# Patient Record
Sex: Male | Born: 1955 | Race: Black or African American | Hispanic: No | Marital: Single | Smoking: Never smoker
Health system: Southern US, Community
[De-identification: ages and names within clinical notes are randomized; demographics above are authoritative.]

## PROBLEM LIST (undated history)

## (undated) DIAGNOSIS — R569 Unspecified convulsions: Secondary | ICD-10-CM

## (undated) DIAGNOSIS — I639 Cerebral infarction, unspecified: Secondary | ICD-10-CM

## (undated) DIAGNOSIS — R040 Epistaxis: Secondary | ICD-10-CM

## (undated) DIAGNOSIS — I671 Cerebral aneurysm, nonruptured: Secondary | ICD-10-CM

## (undated) DIAGNOSIS — K30 Functional dyspepsia: Secondary | ICD-10-CM

## (undated) DIAGNOSIS — I2510 Atherosclerotic heart disease of native coronary artery without angina pectoris: Secondary | ICD-10-CM

## (undated) DIAGNOSIS — H269 Unspecified cataract: Secondary | ICD-10-CM

## (undated) DIAGNOSIS — I10 Essential (primary) hypertension: Secondary | ICD-10-CM

## (undated) DIAGNOSIS — K4090 Unilateral inguinal hernia, without obstruction or gangrene, not specified as recurrent: Secondary | ICD-10-CM

## (undated) DIAGNOSIS — K219 Gastro-esophageal reflux disease without esophagitis: Secondary | ICD-10-CM

## (undated) DIAGNOSIS — IMO0002 Reserved for concepts with insufficient information to code with codable children: Secondary | ICD-10-CM

## (undated) HISTORY — DX: Unilateral inguinal hernia, without obstruction or gangrene, not specified as recurrent: K40.90

## (undated) HISTORY — PX: FRACTURE SURGERY: SHX138

## (undated) HISTORY — PX: FOOT SURGERY: SHX648

## (undated) HISTORY — DX: Atherosclerotic heart disease of native coronary artery without angina pectoris: I25.10

## (undated) HISTORY — PX: SKIN GRAFT: SHX250

---

## 1999-07-22 ENCOUNTER — Emergency Department (HOSPITAL_COMMUNITY): Admission: EM | Admit: 1999-07-22 | Discharge: 1999-07-22 | Payer: Self-pay | Admitting: Emergency Medicine

## 1999-08-10 ENCOUNTER — Emergency Department (HOSPITAL_COMMUNITY): Admission: EM | Admit: 1999-08-10 | Discharge: 1999-08-10 | Payer: Self-pay | Admitting: Emergency Medicine

## 1999-11-13 ENCOUNTER — Emergency Department (HOSPITAL_COMMUNITY): Admission: EM | Admit: 1999-11-13 | Discharge: 1999-11-13 | Payer: Self-pay | Admitting: Emergency Medicine

## 2000-08-28 ENCOUNTER — Emergency Department (HOSPITAL_COMMUNITY): Admission: EM | Admit: 2000-08-28 | Discharge: 2000-08-28 | Payer: Self-pay | Admitting: Emergency Medicine

## 2000-08-28 ENCOUNTER — Encounter: Payer: Self-pay | Admitting: Emergency Medicine

## 2008-07-01 ENCOUNTER — Emergency Department (HOSPITAL_COMMUNITY): Admission: EM | Admit: 2008-07-01 | Discharge: 2008-07-01 | Payer: Self-pay | Admitting: Family Medicine

## 2008-07-03 ENCOUNTER — Emergency Department (HOSPITAL_COMMUNITY): Admission: EM | Admit: 2008-07-03 | Discharge: 2008-07-03 | Payer: Self-pay | Admitting: Family Medicine

## 2008-07-05 ENCOUNTER — Emergency Department (HOSPITAL_COMMUNITY): Admission: EM | Admit: 2008-07-05 | Discharge: 2008-07-05 | Payer: Self-pay | Admitting: Emergency Medicine

## 2010-05-29 ENCOUNTER — Inpatient Hospital Stay (INDEPENDENT_AMBULATORY_CARE_PROVIDER_SITE_OTHER)
Admission: RE | Admit: 2010-05-29 | Discharge: 2010-05-29 | Disposition: A | Discharge status: Home / Self Care | Payer: Self-pay | Source: Ambulatory Visit

## 2010-05-29 DIAGNOSIS — S5000XA Contusion of unspecified elbow, initial encounter: Secondary | ICD-10-CM

## 2010-05-29 DIAGNOSIS — I10 Essential (primary) hypertension: Secondary | ICD-10-CM

## 2010-05-29 LAB — POCT I-STAT, CHEM 8
BUN: 10 mg/dL (ref 6–23)
Calcium, Ion: 1.15 mmol/L (ref 1.12–1.32)
Chloride: 101 mEq/L (ref 96–112)
Creatinine, Ser: 1.2 mg/dL (ref 0.4–1.5)
Glucose, Bld: 120 mg/dL — ABNORMAL HIGH (ref 70–99)
HCT: 50 % (ref 39.0–52.0)
Hemoglobin: 17 g/dL (ref 13.0–17.0)
Potassium: 4.2 mEq/L (ref 3.5–5.1)
Sodium: 137 mEq/L (ref 135–145)
TCO2: 25 mmol/L (ref 0–100)

## 2010-06-06 ENCOUNTER — Inpatient Hospital Stay (INDEPENDENT_AMBULATORY_CARE_PROVIDER_SITE_OTHER)
Admission: RE | Admit: 2010-06-06 | Discharge: 2010-06-06 | Disposition: A | Discharge status: Home / Self Care | Payer: Self-pay | Source: Ambulatory Visit

## 2010-06-06 DIAGNOSIS — S5010XA Contusion of unspecified forearm, initial encounter: Secondary | ICD-10-CM

## 2010-06-06 DIAGNOSIS — I10 Essential (primary) hypertension: Secondary | ICD-10-CM

## 2010-07-11 LAB — POCT I-STAT, CHEM 8
BUN: 32 mg/dL — ABNORMAL HIGH (ref 6–23)
Calcium, Ion: 1.18 mmol/L (ref 1.12–1.32)
Chloride: 108 mEq/L (ref 96–112)
Creatinine, Ser: 1.1 mg/dL (ref 0.4–1.5)
Glucose, Bld: 152 mg/dL — ABNORMAL HIGH (ref 70–99)
HCT: 46 % (ref 39.0–52.0)
Hemoglobin: 15.6 g/dL (ref 13.0–17.0)
Potassium: 4.2 mEq/L (ref 3.5–5.1)
Sodium: 139 mEq/L (ref 135–145)
TCO2: 25 mmol/L (ref 0–100)

## 2010-07-11 LAB — CBC
HCT: 38.2 % — ABNORMAL LOW (ref 39.0–52.0)
Hemoglobin: 13.2 g/dL (ref 13.0–17.0)
MCHC: 34.6 g/dL (ref 30.0–36.0)
MCV: 86.9 fL (ref 78.0–100.0)
Platelets: 216 10*3/uL (ref 150–400)
RBC: 4.4 MIL/uL (ref 4.22–5.81)
RDW: 13.4 % (ref 11.5–15.5)
WBC: 6.5 10*3/uL (ref 4.0–10.5)

## 2011-10-07 ENCOUNTER — Encounter (HOSPITAL_COMMUNITY): Payer: Self-pay | Admitting: Emergency Medicine

## 2011-10-07 ENCOUNTER — Emergency Department (HOSPITAL_COMMUNITY)
Admission: EM | Admit: 2011-10-07 | Discharge: 2011-10-07 | Disposition: A | Discharge status: Home / Self Care | Payer: Self-pay | Attending: Emergency Medicine | Admitting: Emergency Medicine

## 2011-10-07 ENCOUNTER — Emergency Department (INDEPENDENT_AMBULATORY_CARE_PROVIDER_SITE_OTHER)
Admission: EM | Admit: 2011-10-07 | Discharge: 2011-10-07 | Disposition: A | Discharge status: Nursing Home (Includes ICF) | Payer: Self-pay | Source: Home / Self Care | Attending: Family Medicine | Admitting: Family Medicine

## 2011-10-07 DIAGNOSIS — I160 Hypertensive urgency: Secondary | ICD-10-CM

## 2011-10-07 DIAGNOSIS — I10 Essential (primary) hypertension: Secondary | ICD-10-CM | POA: Insufficient documentation

## 2011-10-07 DIAGNOSIS — R040 Epistaxis: Secondary | ICD-10-CM

## 2011-10-07 HISTORY — DX: Essential (primary) hypertension: I10

## 2011-10-07 LAB — CBC WITH DIFFERENTIAL/PLATELET
Basophils Absolute: 0 10*3/uL (ref 0.0–0.1)
Basophils Relative: 1 % (ref 0–1)
Eosinophils Absolute: 0.1 10*3/uL (ref 0.0–0.7)
HCT: 46.8 % (ref 39.0–52.0)
Hemoglobin: 16.3 g/dL (ref 13.0–17.0)
MCH: 29.2 pg (ref 26.0–34.0)
MCHC: 34.8 g/dL (ref 30.0–36.0)
Monocytes Absolute: 0.4 10*3/uL (ref 0.1–1.0)
Monocytes Relative: 4 % (ref 3–12)
Neutro Abs: 7.7 10*3/uL (ref 1.7–7.7)
Platelets: 229 10*3/uL (ref 150–400)
RDW: 12.6 % (ref 11.5–15.5)

## 2011-10-07 LAB — BASIC METABOLIC PANEL
BUN: 27 mg/dL — ABNORMAL HIGH (ref 6–23)
Calcium: 9.5 mg/dL (ref 8.4–10.5)
Chloride: 101 mEq/L (ref 96–112)
Creatinine, Ser: 1.08 mg/dL (ref 0.50–1.35)
GFR calc Af Amer: 86 mL/min — ABNORMAL LOW (ref >90)
GFR calc non Af Amer: 74 mL/min — ABNORMAL LOW (ref >90)
Sodium: 136 mEq/L (ref 135–145)

## 2011-10-07 LAB — URINALYSIS, ROUTINE W REFLEX MICROSCOPIC
Bilirubin Urine: NEGATIVE
Glucose, UA: NEGATIVE mg/dL
Ketones, ur: NEGATIVE mg/dL
Leukocytes, UA: NEGATIVE
Nitrite: NEGATIVE
Protein, ur: NEGATIVE mg/dL

## 2011-10-07 LAB — POCT I-STAT, CHEM 8
Calcium, Ion: 1.2 mmol/L (ref 1.12–1.23)
Glucose, Bld: 88 mg/dL (ref 70–99)
HCT: 49 % (ref 39.0–52.0)
TCO2: 24 mmol/L (ref 0–100)

## 2011-10-07 MED ORDER — FUROSEMIDE 40 MG PO TABS
40.0000 mg | ORAL_TABLET | Freq: Once | ORAL | Status: AC
  Administered 2011-10-07: 40 mg via ORAL

## 2011-10-07 MED ORDER — HYDROCHLOROTHIAZIDE 25 MG PO TABS: 25.0000 mg | ORAL_TABLET | Freq: Every day | ORAL | Status: DC

## 2011-10-07 MED ORDER — CLONIDINE HCL 0.1 MG PO TABS
0.1000 mg | ORAL_TABLET | Freq: Once | ORAL | Status: AC
  Administered 2011-10-07: 0.1 mg via ORAL
  Filled 2011-10-07: qty 1

## 2011-10-07 MED ORDER — LISINOPRIL 10 MG PO TABS
10.0000 mg | ORAL_TABLET | Freq: Once | ORAL | Status: AC
  Administered 2011-10-07: 10 mg via ORAL
  Filled 2011-10-07: qty 1

## 2011-10-07 MED ORDER — OXYMETAZOLINE HCL 0.05 % NA SOLN
1.0000 | Freq: Once | NASAL | Status: AC
  Administered 2011-10-07: 1 via NASAL

## 2011-10-07 MED ORDER — CLONIDINE HCL 0.1 MG PO TABS
0.1000 mg | ORAL_TABLET | Freq: Once | ORAL | Status: AC
  Administered 2011-10-07: 0.1 mg via ORAL

## 2011-10-07 MED ORDER — FUROSEMIDE 40 MG PO TABS
ORAL_TABLET | ORAL | Status: AC
  Filled 2011-10-07: qty 1

## 2011-10-07 MED ORDER — CLONIDINE HCL 0.1 MG PO TABS
ORAL_TABLET | ORAL | Status: AC
  Filled 2011-10-07: qty 1

## 2011-10-07 MED ORDER — FUROSEMIDE 10 MG/ML IJ SOLN: 40.0000 mg | Freq: Once | INTRAMUSCULAR | Status: DC

## 2011-10-07 NOTE — ED Notes (Addendum)
Pt sent here from [facility] for htn and nose bleed today; pt sts off htn meds x 1 year; pt given clonidine at [facility]

## 2011-10-07 NOTE — ED Notes (Signed)
The patient is AOx4 and comfortable with his discharge instructions. 

## 2011-10-07 NOTE — ED Notes (Signed)
C/o nose bleed and high blood pressure.  Currently no bleeding noted and patient denies any bleeding currently.  Also reports nose bleeds last night.  Ran out of blood pressure medicine approx 6 months ago

## 2011-10-07 NOTE — ED Notes (Signed)
I spoke with the MD and advised that the patient had told me he had an intolerance to clonidine in that it makes him nauseous.  The MD advised that he had a dose earlier today at our urgent care center (prior to the medicine being added to his list of allergies).  I asked the patient is he became nauseous earlier today, and he said that he did not.  I suggested that we give him the medicine for its work on his diastolic BP and give him some antiemetics later on prn.  The patient agreed.

## 2011-10-07 NOTE — ED Notes (Signed)
Pt does not have to void at this time he said he went in the waiting room. Asked pt to notify us when he has to go

## 2011-10-08 NOTE — ED Provider Notes (Signed)
History     CSN: [id number]  Arrival date & time 10/07/11  1733   First MD Initiated Contact with Patient 10/07/11 1958      Chief Complaint  Patient presents with  . Epistaxis  . Hypertension    (Consider location/radiation/quality/duration/timing/severity/associated sxs/prior treatment) Patient is a 57 y.o. male presenting with nosebleeds and hypertension. The history is provided by the patient. No language interpreter was used.  Epistaxis  This is a recurrent problem. The current episode started 3 to 5 hours ago. The problem occurs daily. The problem has been resolved. The bleeding has been from the left nare. His past medical history is significant for frequent nosebleeds. His past medical history does not include bleeding disorder, sinus problems or nose-picking.  Hypertension This is a chronic problem. The current episode started more than 1 year ago. The problem occurs daily. The problem has been gradually worsening. Pertinent negatives include no fever, headaches, nausea, neck pain, numbness, visual change, vomiting or weakness. Nothing aggravates the symptoms. He has tried nothing for the symptoms.  56yo male patient with c/o epistaxix that has resolved.  Coming from urgent care with hypertension that has been untreated x 1 year.  Patient thinks he was on hydrochlorothyzide over 1 year ago.  No insurance no pcp.  Patient received clonidine at Urgent care tonight pta. B/p remains elevated 223/123.  Denies h/a or neuro changes including vision. No other pmh or surgeries.    Past Medical History  Diagnosis Date  . Hypertension     History reviewed. No pertinent past surgical history.  History reviewed. No pertinent family history.  History  Substance Use Topics  . Smoking status: Never Smoker   . Smokeless tobacco: Not on file  . Alcohol Use: Yes      Review of Systems  Constitutional: Negative.  Negative for fever.  HENT: Positive for nosebleeds. Negative for neck  pain and neck stiffness.   Eyes: Negative.   Respiratory: Negative.   Cardiovascular: Negative.   Gastrointestinal: Negative.  Negative for nausea and vomiting.  Neurological: Negative.  Negative for weakness, numbness and headaches.  Psychiatric/Behavioral: Negative.   All other systems reviewed and are negative.    Allergies  Clonidine derivatives  Home Medications   Current Outpatient Rx  Name Route Sig Dispense Refill  . HYDROCHLOROTHIAZIDE 25 MG PO TABS Oral Take 1 tablet (25 mg total) by mouth daily. 30 tablet 0    BP 154/102  Pulse 58  Temp 98.3 F (36.8 C) (Oral)  Resp 16  Ht 5\' 6"  (1.676 m)  Wt 150 lb (68.04 kg)  BMI 24.21 kg/m2  SpO2 97%  Physical Exam  Nursing note and vitals reviewed. Constitutional: He is oriented to person, place, and time. He appears well-developed and well-nourished.  HENT:  Head: Normocephalic.  Nose: Nose normal. No nasal septal hematoma.  Eyes: Conjunctivae and EOM are normal. Pupils are equal, round, and reactive to light.  Neck: Normal range of motion. Neck supple.  Cardiovascular: Normal rate and regular rhythm.   Pulmonary/Chest: Effort normal.  Abdominal: Soft.  Musculoskeletal: Normal range of motion.  Neurological: He is alert and oriented to person, place, and time. He has normal strength. No cranial nerve deficit or sensory deficit. Coordination normal. GCS eye subscore is 4. GCS verbal subscore is 5. GCS motor subscore is 6.  Skin: Skin is warm and dry.  Psychiatric: He has a normal mood and affect.    ED Course  Procedures (including critical care time)  Labs  Reviewed  CBC WITH DIFFERENTIAL - Abnormal; Notable for the following:    Neutrophils Relative 87 (*)     Lymphocytes Relative 7 (*)     All other components within normal limits  BASIC METABOLIC PANEL - Abnormal; Notable for the following:    BUN 27 (*)     GFR calc non Af Amer 74 (*)     GFR calc Af Amer 86 (*)     All other components within normal  limits  URINALYSIS, ROUTINE W REFLEX MICROSCOPIC  LAB REPORT - SCANNED   No results found.   1. Hypertension       MDM  epistaxis that has resolved and hypertension.  Clonidine x 2 with some results.  Lisinopril and rx for hydrochlorothiazide.  Followup with pcp of choice or one from the list this week.  Ready for discharge.  bp 154/102.        Remi Haggard, NP 10/08/11 1244

## 2011-10-10 ENCOUNTER — Encounter (HOSPITAL_COMMUNITY): Payer: Self-pay | Admitting: Emergency Medicine

## 2011-10-10 ENCOUNTER — Emergency Department (HOSPITAL_COMMUNITY)
Admission: EM | Admit: 2011-10-10 | Discharge: 2011-10-11 | Disposition: A | Discharge status: Home / Self Care | Payer: Self-pay | Attending: Emergency Medicine | Admitting: Emergency Medicine

## 2011-10-10 DIAGNOSIS — R040 Epistaxis: Secondary | ICD-10-CM | POA: Insufficient documentation

## 2011-10-10 DIAGNOSIS — I10 Essential (primary) hypertension: Secondary | ICD-10-CM | POA: Insufficient documentation

## 2011-10-10 LAB — CBC WITH DIFFERENTIAL/PLATELET
Basophils Absolute: 0 10*3/uL (ref 0.0–0.1)
Basophils Relative: 0 % (ref 0–1)
Lymphocytes Relative: 8 % — ABNORMAL LOW (ref 12–46)
MCHC: 35.2 g/dL (ref 30.0–36.0)
Monocytes Absolute: 0.4 10*3/uL (ref 0.1–1.0)
Neutro Abs: 8.2 10*3/uL — ABNORMAL HIGH (ref 1.7–7.7)
Neutrophils Relative %: 87 % — ABNORMAL HIGH (ref 43–77)
Platelets: 217 10*3/uL (ref 150–400)
RDW: 12.4 % (ref 11.5–15.5)
WBC: 9.4 10*3/uL (ref 4.0–10.5)

## 2011-10-10 LAB — POCT I-STAT, CHEM 8
BUN: 35 mg/dL — ABNORMAL HIGH (ref 6–23)
Calcium, Ion: 1.22 mmol/L (ref 1.12–1.23)
Chloride: 102 mEq/L (ref 96–112)
HCT: 45 % (ref 39.0–52.0)
Sodium: 137 mEq/L (ref 135–145)

## 2011-10-10 MED ORDER — LABETALOL HCL 5 MG/ML IV SOLN
10.0000 mg | Freq: Once | INTRAVENOUS | Status: AC
  Administered 2011-10-10: 10 mg via INTRAVENOUS
  Filled 2011-10-10: qty 4

## 2011-10-10 MED ORDER — SODIUM CHLORIDE 0.9 % IV SOLN
INTRAVENOUS | Status: DC
  Administered 2011-10-10: 23:00:00 via INTRAVENOUS

## 2011-10-10 NOTE — ED Notes (Signed)
No active bleeding at this time. 

## 2011-10-10 NOTE — ED Notes (Signed)
Patient with nosebleed, hypertension, started around 6pm.

## 2011-10-10 NOTE — ED Notes (Signed)
Pt moved to pod B per MD request.

## 2011-10-10 NOTE — ED Provider Notes (Signed)
History     CSN: [id number]  Arrival date & time 10/10/11  2053   First MD Initiated Contact with Patient 10/10/11 2136      Chief Complaint  Patient presents with  . Nosebleed     (Consider location/radiation/quality/duration/timing/severity/associated sxs/prior treatment) HPI This is a 56 year old black male with a four-day history of intermittent episodes of epistaxis. The bleeding is in mild to moderate. It has been from the right nostril. He was seen in the urgent care 2 days ago and treated with Afrin. His nose began bleeding this evening at 6 PM and he states it has not stopped since despite using the Afrin but has slowed significantly. He has been spitting up some clots. He states his stools have been dark as a result of swallowing blood this week. He denies chest pain, dyspnea, nausea or vomiting. He has had some lightheadedness.  Past Medical History  Diagnosis Date  . Hypertension     History reviewed. No pertinent past surgical history.  No family history on file.  History  Substance Use Topics  . Smoking status: Never Smoker   . Smokeless tobacco: Not on file  . Alcohol Use: Yes      Review of Systems  All other systems reviewed and are negative.    Allergies  Clonidine derivatives  Home Medications   Current Outpatient Rx  Name Route Sig Dispense Refill  . HYDROCHLOROTHIAZIDE 25 MG PO TABS Oral Take 1 tablet (25 mg total) by mouth daily. 30 tablet 0  . OXYMETAZOLINE HCL 0.05 % NA SOLN Nasal Place 2 sprays into the nose 2 (two) times daily.      BP 182/112  Pulse 80  Temp 98.7 F (37.1 C) (Oral)  Resp 18  SpO2 98%  Physical Exam General: Well-developed, well-nourished male in no acute distress; appearance consistent with age of record HENT: normocephalic, atraumatic; right anterior epistaxis; blood seen in posterior pharynx Eyes: pupils equal round and reactive to light; extraocular muscles intact; arcus senilis bilaterally Neck:  supple Heart: regular rate and rhythm Lungs: clear to auscultation bilaterally Abdomen: soft; nondistended; nontender Extremities: No deformity; full range of motion; pulses normal; no edema  Neurologic: Awake, alert and oriented; motor function intact in all extremities and symmetric; no facial droop Skin: Warm and dry Psychiatric: Normal mood and affect    ED Course  Procedures (including critical care time)  NASAL PACKING A 4.5 cm anterior Rapid Rhino was saturated with normal saline for approximately 60 seconds then inserted into the patient's right nostril. The cuff is inflated with air. The patient tolerated the procedure well and there were no complications.   MDM   Nursing notes and vitals signs, including pulse oximetry, reviewed.  Summary of this visit's results, reviewed by myself:  Labs:  Results for orders placed during the hospital encounter of 10/10/11  CBC WITH DIFFERENTIAL      Component Value Range   WBC 9.4  4.0 - 10.5 K/uL   RBC 5.07  4.22 - 5.81 MIL/uL   Hemoglobin 14.9  13.0 - 17.0 g/dL   HCT 14.7  82.9 - 56.2 %   MCV 83.4  78.0 - 100.0 fL   MCH 29.4  26.0 - 34.0 pg   MCHC 35.2  30.0 - 36.0 g/dL   RDW 13.0  86.5 - 78.4 %   Platelets 217  150 - 400 K/uL   Neutrophils Relative 87 (*) 43 - 77 %   Neutro Abs 8.2 (*) 1.7 - 7.7 K/uL  Lymphocytes Relative 8 (*) 12 - 46 %   Lymphs Abs 0.8  0.7 - 4.0 K/uL   Monocytes Relative 4  3 - 12 %   Monocytes Absolute 0.4  0.1 - 1.0 K/uL   Eosinophils Relative 0  0 - 5 %   Eosinophils Absolute 0.0  0.0 - 0.7 K/uL   Basophils Relative 0  0 - 1 %   Basophils Absolute 0.0  0.0 - 0.1 K/uL  POCT I-STAT, CHEM 8      Component Value Range   Sodium 137  135 - 145 mEq/L   Potassium 4.5  3.5 - 5.1 mEq/L   Chloride 102  96 - 112 mEq/L   BUN 35 (*) 6 - 23 mg/dL   Creatinine, Ser 4.54  0.50 - 1.35 mg/dL   Glucose, Bld 098 (*) 70 - 99 mg/dL   Calcium, Ion 1.19  1.47 - 1.23 mmol/L   TCO2 24  0 - 100 mmol/L   Hemoglobin  15.3  13.0 - 17.0 g/dL   HCT 82.9  56.2 - 13.0 %    Imaging Studies: No results found.   11:15 PM Afrin instilled in nares.  1:13 AM Patient hemostatic after placement of Rapid Rhino. We'll have him return in 24 hours.  Hanley Seamen, MD 10/11/11 [phone]

## 2011-10-10 NOTE — ED Provider Notes (Signed)
Medical screening examination/treatment/procedure(s) were performed by non-physician practitioner and as supervising physician I was immediately available for consultation/collaboration.  Reno Clasby K Linker, MD 10/10/11 0705 

## 2011-10-11 MED ORDER — HYDROCODONE-ACETAMINOPHEN 5-325 MG PO TABS: 1.0000 | ORAL_TABLET | Freq: Four times a day (QID) | ORAL | Status: AC | PRN

## 2011-10-11 MED ORDER — HYDROCODONE-ACETAMINOPHEN 5-325 MG PO TABS
1.0000 | ORAL_TABLET | Freq: Once | ORAL | Status: AC
  Administered 2011-10-11: 1 via ORAL

## 2011-10-11 MED ORDER — HYDROCODONE-ACETAMINOPHEN 5-325 MG PO TABS
ORAL_TABLET | ORAL | Status: AC
  Filled 2011-10-11: qty 1

## 2011-10-11 MED ORDER — METOPROLOL TARTRATE 100 MG PO TABS: 50.0000 mg | ORAL_TABLET | Freq: Two times a day (BID) | ORAL | Status: DC

## 2011-10-11 NOTE — ED Notes (Signed)
Pt discharged with family via wheelchair

## 2011-10-13 ENCOUNTER — Emergency Department (HOSPITAL_COMMUNITY)
Admission: EM | Admit: 2011-10-13 | Discharge: 2011-10-13 | Disposition: A | Discharge status: Home / Self Care | Payer: Self-pay | Attending: Emergency Medicine | Admitting: Emergency Medicine

## 2011-10-13 ENCOUNTER — Encounter (HOSPITAL_COMMUNITY): Payer: Self-pay | Admitting: Emergency Medicine

## 2011-10-13 DIAGNOSIS — R040 Epistaxis: Secondary | ICD-10-CM | POA: Insufficient documentation

## 2011-10-13 DIAGNOSIS — I10 Essential (primary) hypertension: Secondary | ICD-10-CM | POA: Insufficient documentation

## 2011-10-13 NOTE — ED Provider Notes (Signed)
History  Scribed for Ethelda Chick, MD, the patient was seen in room TR05C/TR05C. This chart was scribed by Candelaria Stagers. The patient's care started at 11:10 AM   CSN: [id number]  Arrival date & time 10/13/11  1012   None     Chief Complaint  Patient presents with  . Epistaxis     The history is provided by the patient.   Luis Oconnor is a 56 y.o. male who presents to the Emergency Department for a f/u after having nasal packing, Rapid Rhino inserted four days ago in the right nostril.  Pt states the bleeding has since stopped.  Pt has h/o HTN and does not take blood thinners.  No fever or increased drainage from nose, no nasal pain  Past Medical History  Diagnosis Date  . Hypertension     History reviewed. No pertinent past surgical history.  No family history on file.  History  Substance Use Topics  . Smoking status: Never Smoker   . Smokeless tobacco: Not on file  . Alcohol Use: Yes      Review of Systems  HENT:       Nasal packing in place, right nostril.  All other systems reviewed and are negative.    Allergies  Clonidine derivatives  Home Medications   Current Outpatient Rx  Name Route Sig Dispense Refill  . HYDROCHLOROTHIAZIDE 25 MG PO TABS Oral Take 1 tablet (25 mg total) by mouth daily. 30 tablet 0  . HYDROCODONE-ACETAMINOPHEN 5-325 MG PO TABS Oral Take 1-2 tablets by mouth every 6 (six) hours as needed for pain. 10 tablet 0  . METOPROLOL TARTRATE 100 MG PO TABS Oral Take 0.5 tablets (50 mg total) by mouth 2 (two) times daily. 30 tablet 0  . OXYMETAZOLINE HCL 0.05 % NA SOLN Nasal Place 2 sprays into the nose 2 (two) times daily.      BP 130/96  Pulse 83  Temp 98.2 F (36.8 C) (Oral)  Resp 16  SpO2 94%  Physical Exam  Nursing note and vitals reviewed. Constitutional: He is oriented to person, place, and time. He appears well-developed and well-nourished. No distress.  HENT:  Head: Normocephalic and atraumatic.       Rapid Rhino in  place in right nostril.    Eyes: Right eye exhibits no discharge. Left eye exhibits no discharge.  Musculoskeletal: Normal range of motion. He exhibits no tenderness.  Neurological: He is alert and oriented to person, place, and time.  Skin: Skin is warm and dry. He is not diaphoretic.  Psychiatric: He has a normal mood and affect. His behavior is normal.    ED Course  Procedures   DIAGNOSTIC STUDIES: Oxygen Saturation is 94% on room air, normal by my interpretation.    COORDINATION OF CARE:     Labs Reviewed - No data to display No results found.   1. Epistaxis       MDM  Pt presenting for removal of nasal packing- placed 3 days ago for epistaxis.  Bleeding has been controlled.  Rhino rocket- removed without incident- no further bleeding after observation in ED.  Pt encouragd to take his BP meds.  Discharged with strict return precautions.  Pt agreeable with plan.  I personally performed the services described in this documentation, which was scribed in my presence. The recorded information has been reviewed and considered.         Ethelda Chick, MD 10/13/11 [phone]

## 2011-10-13 NOTE — ED Notes (Signed)
Pt. Here to have his nose bleed re-evaluated and remove the plug to stop bleeding.  Inserted on Thursday

## 2011-10-13 NOTE — ED Notes (Signed)
Linker, MD at bedside. 

## 2012-06-14 ENCOUNTER — Encounter (HOSPITAL_COMMUNITY): Payer: Self-pay | Admitting: Anesthesiology

## 2012-06-14 ENCOUNTER — Encounter (HOSPITAL_COMMUNITY)
Admission: EM | Disposition: A | Discharge status: Rehabilitation Facility | Payer: Self-pay | Source: Home / Self Care | Attending: Neurosurgery

## 2012-06-14 ENCOUNTER — Encounter (HOSPITAL_COMMUNITY): Payer: Self-pay

## 2012-06-14 ENCOUNTER — Inpatient Hospital Stay (HOSPITAL_COMMUNITY): Payer: Medicaid Other

## 2012-06-14 ENCOUNTER — Emergency Department (HOSPITAL_COMMUNITY): Payer: Medicaid Other | Admitting: Anesthesiology

## 2012-06-14 ENCOUNTER — Emergency Department (HOSPITAL_COMMUNITY): Payer: Medicaid Other

## 2012-06-14 ENCOUNTER — Inpatient Hospital Stay (HOSPITAL_COMMUNITY)
Admission: EM | Admit: 2012-06-14 | Discharge: 2012-06-23 | DRG: 024 | Disposition: A | Discharge status: Rehabilitation Facility | Payer: Medicaid Other | Attending: Neurosurgery | Admitting: Neurosurgery

## 2012-06-14 DIAGNOSIS — I619 Nontraumatic intracerebral hemorrhage, unspecified: Principal | ICD-10-CM | POA: Diagnosis present

## 2012-06-14 DIAGNOSIS — Z79899 Other long term (current) drug therapy: Secondary | ICD-10-CM

## 2012-06-14 DIAGNOSIS — Z91199 Patient's noncompliance with other medical treatment and regimen due to unspecified reason: Secondary | ICD-10-CM

## 2012-06-14 DIAGNOSIS — Z9119 Patient's noncompliance with other medical treatment and regimen: Secondary | ICD-10-CM

## 2012-06-14 DIAGNOSIS — R5081 Fever presenting with conditions classified elsewhere: Secondary | ICD-10-CM | POA: Diagnosis not present

## 2012-06-14 DIAGNOSIS — R509 Fever, unspecified: Secondary | ICD-10-CM

## 2012-06-14 DIAGNOSIS — I10 Essential (primary) hypertension: Secondary | ICD-10-CM | POA: Diagnosis present

## 2012-06-14 DIAGNOSIS — Z888 Allergy status to other drugs, medicaments and biological substances status: Secondary | ICD-10-CM

## 2012-06-14 DIAGNOSIS — R29818 Other symptoms and signs involving the nervous system: Secondary | ICD-10-CM | POA: Diagnosis present

## 2012-06-14 DIAGNOSIS — R5082 Postprocedural fever: Secondary | ICD-10-CM

## 2012-06-14 DIAGNOSIS — I629 Nontraumatic intracranial hemorrhage, unspecified: Secondary | ICD-10-CM

## 2012-06-14 HISTORY — DX: Reserved for concepts with insufficient information to code with codable children: IMO0002

## 2012-06-14 HISTORY — PX: CRANIOTOMY: SHX93

## 2012-06-14 HISTORY — DX: Nontraumatic intracerebral hemorrhage, unspecified: I61.9

## 2012-06-14 LAB — COMPREHENSIVE METABOLIC PANEL
ALT: 15 U/L (ref 0–53)
AST: 26 U/L (ref 0–37)
CO2: 23 mEq/L (ref 19–32)
Chloride: 95 mEq/L — ABNORMAL LOW (ref 96–112)
GFR calc non Af Amer: >90 mL/min (ref >90)
Glucose, Bld: 180 mg/dL — ABNORMAL HIGH (ref 70–99)
Sodium: 133 mEq/L — ABNORMAL LOW (ref 135–145)
Total Bilirubin: 1.3 mg/dL — ABNORMAL HIGH (ref 0.3–1.2)

## 2012-06-14 LAB — CBC
HCT: 43 % (ref 39.0–52.0)
MCV: 81.6 fL (ref 78.0–100.0)
Platelets: 198 10*3/uL (ref 150–400)
RBC: 5.27 MIL/uL (ref 4.22–5.81)
WBC: 12.3 10*3/uL — ABNORMAL HIGH (ref 4.0–10.5)

## 2012-06-14 LAB — URINE MICROSCOPIC-ADD ON

## 2012-06-14 LAB — URINALYSIS, ROUTINE W REFLEX MICROSCOPIC
Glucose, UA: 100 mg/dL — AB
Protein, ur: 100 mg/dL — AB
Specific Gravity, Urine: 1.04 — ABNORMAL HIGH (ref 1.005–1.030)
pH: 5.5 (ref 5.0–8.0)

## 2012-06-14 LAB — BLOOD GAS, ARTERIAL
Acid-base deficit: 1.1 mmol/L (ref 0.0–2.0)
Drawn by: 129801
TCO2: 23.1 mmol/L (ref 0–100)
pCO2 arterial: 31 mmHg — ABNORMAL LOW (ref 35.0–45.0)
pH, Arterial: 7.468 — ABNORMAL HIGH (ref 7.350–7.450)
pO2, Arterial: 77 mmHg — ABNORMAL LOW (ref 80.0–100.0)

## 2012-06-14 LAB — CBC WITH DIFFERENTIAL/PLATELET
Basophils Absolute: 0 K/uL (ref 0.0–0.1)
Basophils Relative: 0 % (ref 0–1)
Eosinophils Absolute: 0 K/uL (ref 0.0–0.7)
Eosinophils Relative: 0 % (ref 0–5)
HCT: 46.9 % (ref 39.0–52.0)
Hemoglobin: 17.1 g/dL — ABNORMAL HIGH (ref 13.0–17.0)
Lymphocytes Relative: 2 % — ABNORMAL LOW (ref 12–46)
Lymphs Abs: 0.3 K/uL — ABNORMAL LOW (ref 0.7–4.0)
MCH: 29.8 pg (ref 26.0–34.0)
MCHC: 36.5 g/dL — ABNORMAL HIGH (ref 30.0–36.0)
MCV: 81.8 fL (ref 78.0–100.0)
Monocytes Absolute: 0.6 K/uL (ref 0.1–1.0)
Monocytes Relative: 4 % (ref 3–12)
Neutro Abs: 12.8 K/uL — ABNORMAL HIGH (ref 1.7–7.7)
Neutrophils Relative %: 94 % — ABNORMAL HIGH (ref 43–77)
Platelets: 220 K/uL (ref 150–400)
RBC: 5.73 MIL/uL (ref 4.22–5.81)
RDW: 12.5 % (ref 11.5–15.5)
WBC: 13.6 K/uL — ABNORMAL HIGH (ref 4.0–10.5)

## 2012-06-14 LAB — BASIC METABOLIC PANEL
CO2: 22 mEq/L (ref 19–32)
Chloride: 100 mEq/L (ref 96–112)
Sodium: 133 mEq/L — ABNORMAL LOW (ref 135–145)

## 2012-06-14 LAB — TROPONIN I: Troponin I: <0.3 ng/mL

## 2012-06-14 LAB — PROCALCITONIN: Procalcitonin: <0.1 ng/mL

## 2012-06-14 SURGERY — CRANIOTOMY HEMATOMA EVACUATION SUBDURAL
Anesthesia: General | Site: Head | Laterality: Right | Wound class: Clean

## 2012-06-14 MED ORDER — FENTANYL CITRATE 0.05 MG/ML IJ SOLN
INTRAMUSCULAR | Status: DC | PRN
  Administered 2012-06-14: 100 ug via INTRAVENOUS

## 2012-06-14 MED ORDER — SODIUM CHLORIDE 0.9 % IV SOLN
INTRAVENOUS | Status: DC | PRN
  Administered 2012-06-14: 14:00:00 via INTRAVENOUS

## 2012-06-14 MED ORDER — PANTOPRAZOLE SODIUM 40 MG IV SOLR
40.0000 mg | Freq: Every day | INTRAVENOUS | Status: DC
  Administered 2012-06-14: 40 mg via INTRAVENOUS
  Filled 2012-06-14 (×2): qty 40

## 2012-06-14 MED ORDER — MEPERIDINE HCL 25 MG/ML IJ SOLN: 6.2500 mg | INTRAMUSCULAR | Status: DC | PRN

## 2012-06-14 MED ORDER — DEXTROSE 5 % IV SOLN
20.0000 mg | INTRAVENOUS | Status: DC | PRN
  Administered 2012-06-14: 50 ug/min via INTRAVENOUS

## 2012-06-14 MED ORDER — PROPOFOL 10 MG/ML IV BOLUS
INTRAVENOUS | Status: DC | PRN
  Administered 2012-06-14: 50 mg via INTRAVENOUS
  Administered 2012-06-14: 30 mg via INTRAVENOUS
  Administered 2012-06-14: 100 mg via INTRAVENOUS
  Administered 2012-06-14: 150 mg via INTRAVENOUS

## 2012-06-14 MED ORDER — NICARDIPINE HCL IN NACL 20-0.86 MG/200ML-% IV SOLN
5.0000 mg/h | INTRAVENOUS | Status: DC
  Administered 2012-06-14: 5 mg/h via INTRAVENOUS
  Administered 2012-06-14: 11 mg/h via INTRAVENOUS
  Administered 2012-06-15: 5 mg/h via INTRAVENOUS
  Administered 2012-06-15: 12 mg/h via INTRAVENOUS
  Administered 2012-06-15: 9 mg/h via INTRAVENOUS
  Filled 2012-06-14 (×8): qty 200

## 2012-06-14 MED ORDER — 0.9 % SODIUM CHLORIDE (POUR BTL) OPTIME
TOPICAL | Status: DC | PRN
  Administered 2012-06-14 (×2): 1000 mL

## 2012-06-14 MED ORDER — HYDROMORPHONE HCL PF 1 MG/ML IJ SOLN
1.0000 mg | INTRAMUSCULAR | Status: DC | PRN
  Administered 2012-06-14: 1 mg via INTRAMUSCULAR
  Administered 2012-06-15 (×2): 0.5 mg via INTRAMUSCULAR
  Administered 2012-06-17 (×3): 1 mg via INTRAMUSCULAR
  Filled 2012-06-14 (×6): qty 1

## 2012-06-14 MED ORDER — SODIUM CHLORIDE 0.9 % IV SOLN
INTRAVENOUS | Status: DC
  Administered 2012-06-15: 75 mL/h via INTRAVENOUS
  Administered 2012-06-15 – 2012-06-16 (×3): via INTRAVENOUS

## 2012-06-14 MED ORDER — ONDANSETRON HCL 4 MG/2ML IJ SOLN
4.0000 mg | INTRAMUSCULAR | Status: DC | PRN
  Administered 2012-06-16 – 2012-06-20 (×5): 4 mg via INTRAVENOUS
  Filled 2012-06-14 (×6): qty 2

## 2012-06-14 MED ORDER — CHLORHEXIDINE GLUCONATE 0.12 % MT SOLN
15.0000 mL | Freq: Two times a day (BID) | OROMUCOSAL | Status: DC
  Administered 2012-06-14 – 2012-06-18 (×5): 15 mL via OROMUCOSAL
  Filled 2012-06-14 (×4): qty 15

## 2012-06-14 MED ORDER — LIDOCAINE HCL (PF) 1 % IJ SOLN
INTRAMUSCULAR | Status: DC | PRN
  Administered 2012-06-14: 10 mL via INTRADERMAL

## 2012-06-14 MED ORDER — OXYCODONE HCL 5 MG PO TABS: 5.0000 mg | ORAL_TABLET | Freq: Once | ORAL | Status: DC | PRN

## 2012-06-14 MED ORDER — HYDRALAZINE HCL 20 MG/ML IJ SOLN
INTRAMUSCULAR | Status: DC | PRN
  Administered 2012-06-14: 5 mg via INTRAVENOUS
  Administered 2012-06-14: 2.5 mg via INTRAVENOUS
  Administered 2012-06-14 (×2): 5 mg via INTRAVENOUS
  Administered 2012-06-14: 2.5 mg via INTRAVENOUS

## 2012-06-14 MED ORDER — SUCCINYLCHOLINE CHLORIDE 20 MG/ML IJ SOLN
INTRAMUSCULAR | Status: DC | PRN
  Administered 2012-06-14: 110 mg via INTRAVENOUS

## 2012-06-14 MED ORDER — PROMETHAZINE HCL 25 MG/ML IJ SOLN
6.2500 mg | INTRAMUSCULAR | Status: DC | PRN
  Filled 2012-06-14: qty 1

## 2012-06-14 MED ORDER — FENTANYL CITRATE 0.05 MG/ML IJ SOLN: 25.0000 ug | INTRAMUSCULAR | Status: DC | PRN

## 2012-06-14 MED ORDER — SODIUM CHLORIDE 0.9 % IR SOLN
Status: DC | PRN
  Administered 2012-06-14: 14:00:00

## 2012-06-14 MED ORDER — BIOTENE DRY MOUTH MT LIQD
15.0000 mL | Freq: Two times a day (BID) | OROMUCOSAL | Status: DC
  Administered 2012-06-15 – 2012-06-18 (×7): 15 mL via OROMUCOSAL

## 2012-06-14 MED ORDER — LIDOCAINE HCL (CARDIAC) 20 MG/ML IV SOLN
INTRAVENOUS | Status: DC | PRN
  Administered 2012-06-14: 80 mg via INTRAVENOUS

## 2012-06-14 MED ORDER — HYDRALAZINE HCL 20 MG/ML IJ SOLN
INTRAMUSCULAR | Status: AC
  Filled 2012-06-14: qty 1

## 2012-06-14 MED ORDER — SODIUM CHLORIDE 0.9 % IV SOLN
INTRAVENOUS | Status: DC | PRN
  Administered 2012-06-14: 13:00:00 via INTRAVENOUS

## 2012-06-14 MED ORDER — ONDANSETRON HCL 4 MG/2ML IJ SOLN
INTRAMUSCULAR | Status: DC | PRN
  Administered 2012-06-14: 4 mg via INTRAVENOUS

## 2012-06-14 MED ORDER — CEFAZOLIN SODIUM-DEXTROSE 2-3 GM-% IV SOLR
INTRAVENOUS | Status: DC | PRN
  Administered 2012-06-14: 2 g via INTRAVENOUS

## 2012-06-14 MED ORDER — HYDROCHLOROTHIAZIDE 25 MG PO TABS
25.0000 mg | ORAL_TABLET | Freq: Every day | ORAL | Status: DC
  Administered 2012-06-15 – 2012-06-23 (×9): 25 mg via ORAL
  Filled 2012-06-14 (×11): qty 1

## 2012-06-14 MED ORDER — METOPROLOL TARTRATE 50 MG PO TABS
50.0000 mg | ORAL_TABLET | Freq: Two times a day (BID) | ORAL | Status: DC
  Administered 2012-06-15: 50 mg via ORAL
  Filled 2012-06-14 (×7): qty 1

## 2012-06-14 MED ORDER — HYDRALAZINE HCL 20 MG/ML IJ SOLN
10.0000 mg | Freq: Once | INTRAMUSCULAR | Status: AC
  Administered 2012-06-14: 10 mg via INTRAVENOUS

## 2012-06-14 MED ORDER — OXYCODONE-ACETAMINOPHEN 5-325 MG PO TABS
1.0000 | ORAL_TABLET | ORAL | Status: DC | PRN
  Administered 2012-06-16 – 2012-06-20 (×14): 1 via ORAL
  Filled 2012-06-14 (×14): qty 1

## 2012-06-14 MED ORDER — CHLORHEXIDINE GLUCONATE 0.12 % MT SOLN
OROMUCOSAL | Status: AC
  Filled 2012-06-14: qty 15

## 2012-06-14 MED ORDER — OXYCODONE HCL 5 MG/5ML PO SOLN: 5.0000 mg | Freq: Once | ORAL | Status: DC | PRN

## 2012-06-14 MED ORDER — ROCURONIUM BROMIDE 100 MG/10ML IV SOLN
INTRAVENOUS | Status: DC | PRN
  Administered 2012-06-14: 40 mg via INTRAVENOUS
  Administered 2012-06-14: 10 mg via INTRAVENOUS

## 2012-06-14 MED ORDER — LABETALOL HCL 5 MG/ML IV SOLN
20.0000 mg | Freq: Once | INTRAVENOUS | Status: DC
  Filled 2012-06-14: qty 4

## 2012-06-14 MED ORDER — MICROFIBRILLAR COLL HEMOSTAT EX PADS
MEDICATED_PAD | CUTANEOUS | Status: DC | PRN
  Administered 2012-06-14: 1 via TOPICAL

## 2012-06-14 MED ORDER — HYDROCODONE-ACETAMINOPHEN 5-325 MG PO TABS
1.0000 | ORAL_TABLET | ORAL | Status: DC | PRN
  Administered 2012-06-16 – 2012-06-23 (×16): 1 via ORAL
  Filled 2012-06-14 (×16): qty 1

## 2012-06-14 MED ORDER — KCL IN DEXTROSE-NACL 20-5-0.45 MEQ/L-%-% IV SOLN
INTRAVENOUS | Status: DC
  Filled 2012-06-14 (×2): qty 1000

## 2012-06-14 MED ORDER — GLYCOPYRROLATE 0.2 MG/ML IJ SOLN
INTRAMUSCULAR | Status: DC | PRN
  Administered 2012-06-14: 0.4 mg via INTRAVENOUS

## 2012-06-14 MED ORDER — CEFAZOLIN SODIUM-DEXTROSE 2-3 GM-% IV SOLR
2.0000 g | Freq: Three times a day (TID) | INTRAVENOUS | Status: AC
  Administered 2012-06-14 – 2012-06-15 (×2): 2 g via INTRAVENOUS
  Filled 2012-06-14 (×2): qty 50

## 2012-06-14 MED ORDER — THROMBIN 20000 UNITS EX KIT
PACK | CUTANEOUS | Status: DC | PRN
  Administered 2012-06-14: 14:00:00 via TOPICAL

## 2012-06-14 MED ORDER — SODIUM CHLORIDE 0.9 % IV SOLN
500.0000 mg | Freq: Two times a day (BID) | INTRAVENOUS | Status: DC
  Administered 2012-06-14 – 2012-06-19 (×10): 500 mg via INTRAVENOUS
  Filled 2012-06-14 (×11): qty 5

## 2012-06-14 MED ORDER — PROMETHAZINE HCL 25 MG PO TABS: 12.5000 mg | ORAL_TABLET | ORAL | Status: DC | PRN

## 2012-06-14 MED ORDER — ARTIFICIAL TEARS OP OINT
TOPICAL_OINTMENT | OPHTHALMIC | Status: DC | PRN
  Administered 2012-06-14: 1 via OPHTHALMIC

## 2012-06-14 MED ORDER — ONDANSETRON HCL 4 MG PO TABS: 4.0000 mg | ORAL_TABLET | ORAL | Status: DC | PRN

## 2012-06-14 MED ORDER — LABETALOL HCL 5 MG/ML IV SOLN
INTRAVENOUS | Status: DC | PRN
  Administered 2012-06-14: 10 mg via INTRAVENOUS
  Administered 2012-06-14 (×2): 5 mg via INTRAVENOUS

## 2012-06-14 MED ORDER — NEOSTIGMINE METHYLSULFATE 1 MG/ML IJ SOLN
INTRAMUSCULAR | Status: DC | PRN
  Administered 2012-06-14: 3 mg via INTRAVENOUS

## 2012-06-14 MED ORDER — LABETALOL HCL 5 MG/ML IV SOLN
10.0000 mg | INTRAVENOUS | Status: DC | PRN
  Administered 2012-06-14: 10 mg via INTRAVENOUS
  Administered 2012-06-14 (×2): 20 mg via INTRAVENOUS
  Administered 2012-06-15: 40 mg via INTRAVENOUS
  Administered 2012-06-16 – 2012-06-18 (×3): 20 mg via INTRAVENOUS
  Filled 2012-06-14 (×5): qty 4
  Filled 2012-06-14: qty 8

## 2012-06-14 SURGICAL SUPPLY — 70 items
APL SKNCLS STERI-STRIP NONHPOA (GAUZE/BANDAGES/DRESSINGS) ×1
BAG DECANTER FOR FLEXI CONT (MISCELLANEOUS) ×2 IMPLANT
BANDAGE GAUZE 4  KLING STR (GAUZE/BANDAGES/DRESSINGS) ×2 IMPLANT
BANDAGE GAUZE ELAST BULKY 4 IN (GAUZE/BANDAGES/DRESSINGS) ×2 IMPLANT
BENZOIN TINCTURE PRP APPL 2/3 (GAUZE/BANDAGES/DRESSINGS) ×2 IMPLANT
BIT DRILL WIRE PASS 1.3MM (BIT) ×1 IMPLANT
BLADE SURG ROTATE 9660 (MISCELLANEOUS) ×2 IMPLANT
BRUSH SCRUB EZ 1% IODOPHOR (MISCELLANEOUS) IMPLANT
BRUSH SCRUB EZ PLAIN DRY (MISCELLANEOUS) ×2 IMPLANT
BUR ROUTER D-58 CRANI (BURR) IMPLANT
CANISTER SUCTION 2500CC (MISCELLANEOUS) ×3 IMPLANT
CLIP TI MEDIUM 6 (CLIP) IMPLANT
CLOTH BEACON ORANGE TIMEOUT ST (SAFETY) ×2 IMPLANT
CONT SPEC 4OZ CLIKSEAL STRL BL (MISCELLANEOUS) ×3 IMPLANT
CORDS BIPOLAR (ELECTRODE) ×2 IMPLANT
COVER TABLE BACK 60X90 (DRAPES) ×4 IMPLANT
DRAPE NEUROLOGICAL W/INCISE (DRAPES) ×2 IMPLANT
DRAPE SURG 17X23 STRL (DRAPES) IMPLANT
DRAPE WARM FLUID 44X44 (DRAPE) ×2 IMPLANT
DRESSING TELFA 8X3 (GAUZE/BANDAGES/DRESSINGS) ×2 IMPLANT
DRILL WIRE PASS 1.3MM (BIT) ×2
ELECT CAUTERY BLADE 6.4 (BLADE) ×2 IMPLANT
ELECT REM PT RETURN 9FT ADLT (ELECTROSURGICAL) ×2
ELECTRODE REM PT RTRN 9FT ADLT (ELECTROSURGICAL) ×1 IMPLANT
GAUZE SPONGE 4X4 16PLY XRAY LF (GAUZE/BANDAGES/DRESSINGS) IMPLANT
GLOVE BIO SURGEON STRL SZ 6.5 (GLOVE) ×1 IMPLANT
GLOVE BIO SURGEON STRL SZ7 (GLOVE) ×3 IMPLANT
GLOVE ECLIPSE 7.5 STRL STRAW (GLOVE) ×2 IMPLANT
GLOVE EXAM NITRILE LRG STRL (GLOVE) IMPLANT
GLOVE EXAM NITRILE XL STR (GLOVE) IMPLANT
GLOVE EXAM NITRILE XS STR PU (GLOVE) ×1 IMPLANT
GOWN BRE IMP SLV AUR LG STRL (GOWN DISPOSABLE) ×1 IMPLANT
GOWN BRE IMP SLV AUR XL STRL (GOWN DISPOSABLE) IMPLANT
GOWN STRL REIN 2XL LVL4 (GOWN DISPOSABLE) ×2 IMPLANT
HEMOSTAT SURGICEL 2X14 (HEMOSTASIS) ×2 IMPLANT
KIT BASIN OR (CUSTOM PROCEDURE TRAY) ×2 IMPLANT
KIT ROOM TURNOVER OR (KITS) ×2 IMPLANT
NEEDLE HYPO 22GX1.5 SAFETY (NEEDLE) ×2 IMPLANT
NS IRRIG 1000ML POUR BTL (IV SOLUTION) ×3 IMPLANT
PACK CRANIOTOMY (CUSTOM PROCEDURE TRAY) ×2 IMPLANT
PAD ARMBOARD 7.5X6 YLW CONV (MISCELLANEOUS) ×3 IMPLANT
PAD EYE OVAL STERILE LF (GAUZE/BANDAGES/DRESSINGS) IMPLANT
PATTIES SURGICAL .25X.25 (GAUZE/BANDAGES/DRESSINGS) IMPLANT
PATTIES SURGICAL .5 X.5 (GAUZE/BANDAGES/DRESSINGS) IMPLANT
PATTIES SURGICAL .5 X3 (DISPOSABLE) IMPLANT
PATTIES SURGICAL .75X.75 (GAUZE/BANDAGES/DRESSINGS) ×1 IMPLANT
PATTIES SURGICAL 1X1 (DISPOSABLE) IMPLANT
PERFORATOR LRG  14-11MM (BIT) ×1
PERFORATOR LRG 14-11MM (BIT) ×1 IMPLANT
PIN MAYFIELD SKULL DISP (PIN) IMPLANT
PLATE 1.5  2HOLE LNG NEURO (Plate) ×3 IMPLANT
PLATE 1.5 2HOLE LNG NEURO (Plate) IMPLANT
SCREW SELF DRILL HT 1.5/4MM (Screw) ×6 IMPLANT
SPECIMEN JAR SMALL (MISCELLANEOUS) ×1 IMPLANT
SPONGE GAUZE 4X4 12PLY (GAUZE/BANDAGES/DRESSINGS) ×1 IMPLANT
SPONGE NEURO XRAY DETECT 1X3 (DISPOSABLE) IMPLANT
SPONGE SURGIFOAM ABS GEL 100 (HEMOSTASIS) ×2 IMPLANT
STAPLER SKIN PROX WIDE 3.9 (STAPLE) ×2 IMPLANT
SUT NURALON 4 0 TR CR/8 (SUTURE) ×4 IMPLANT
SUT VIC AB 2-0 OS6 18 (SUTURE) ×6 IMPLANT
SUT VIC AB 3-0 CP2 18 (SUTURE) ×2 IMPLANT
SYR 20ML ECCENTRIC (SYRINGE) ×2 IMPLANT
SYR CONTROL 10ML LL (SYRINGE) ×2 IMPLANT
TAPE SURG TRANSPORE 1 IN (GAUZE/BANDAGES/DRESSINGS) IMPLANT
TAPE SURGICAL TRANSPORE 1 IN (GAUZE/BANDAGES/DRESSINGS) ×1
TOWEL OR 17X24 6PK STRL BLUE (TOWEL DISPOSABLE) ×2 IMPLANT
TOWEL OR 17X26 10 PK STRL BLUE (TOWEL DISPOSABLE) ×2 IMPLANT
TRAY FOLEY CATH 14FRSI W/METER (CATHETERS) ×1 IMPLANT
UNDERPAD 30X30 INCONTINENT (UNDERPADS AND DIAPERS) ×1 IMPLANT
WATER STERILE IRR 1000ML POUR (IV SOLUTION) ×2 IMPLANT

## 2012-06-14 NOTE — ED Provider Notes (Addendum)
History     CSN: [id number]  Arrival date & time 06/14/12  0932   First MD Initiated Contact with Patient 06/14/12 0935      Chief Complaint  Patient presents with  . Headache    (Consider location/radiation/quality/duration/timing/severity/associated sxs/prior treatment) HPI Comments: Patient brought to the emergency department by ambulance. Wife reports that the patient was sent home from work last night because of headache. Patient reports that the began to experience throbbing headache in the right side of his forehead last night around 8 PM. The headache has been severe and has been continuous all my. He has been complaining of blurred vision. The headache kept him awake last night. Wife reports that he "isn't thinking right" this morning, seems to be confused. He has been complaining of blurred vision. She called an ambulance because of this. EMS reports significant hypertension. Patient does have a history of hypertension, round and his medications about 2 months ago.  Patient is a 57 y.o. male presenting with headaches.  Headache   Past Medical History  Diagnosis Date  . Hypertension     No past surgical history on file.  No family history on file.  History  Substance Use Topics  . Smoking status: Never Smoker   . Smokeless tobacco: Not on file  . Alcohol Use: Yes      Review of Systems  Eyes: Positive for visual disturbance.  Neurological: Positive for headaches.  All other systems reviewed and are negative.    Allergies  Clonidine derivatives  Home Medications   Current Outpatient Rx  Name  Route  Sig  Dispense  Refill  . hydrochlorothiazide (HYDRODIURIL) 25 MG tablet   Oral   Take 1 tablet (25 mg total) by mouth daily.   30 tablet   0   . metoprolol (LOPRESSOR) 100 MG tablet   Oral   Take 0.5 tablets (50 mg total) by mouth 2 (two) times daily.   30 tablet   0   . oxymetazoline (AFRIN) 0.05 % nasal spray   Nasal   Place 2 sprays into the  nose 2 (two) times daily.           There were no vitals taken for this visit.  Physical Exam  Constitutional: He is oriented to person, place, and time. He appears well-developed and well-nourished. No distress.  HENT:  Head: Normocephalic and atraumatic.  Right Ear: Hearing normal.  Nose: Nose normal.  Mouth/Throat: Oropharynx is clear and moist and mucous membranes are normal.  Eyes: Conjunctivae and EOM are normal. Pupils are equal, round, and reactive to light.  Neck: Normal range of motion. Neck supple.  Cardiovascular: Normal rate, regular rhythm, S1 normal and S2 normal.  Exam reveals no gallop and no friction rub.   No murmur heard. Pulmonary/Chest: Effort normal and breath sounds normal. No respiratory distress. He exhibits no tenderness.  Abdominal: Soft. Normal appearance and bowel sounds are normal. There is no hepatosplenomegaly. There is no tenderness. There is no rebound, no guarding, no tenderness at McBurney's point and negative Murphy's sign. No hernia.  Musculoskeletal: Normal range of motion.  Neurological: He is alert and oriented to person, place, and time. He has normal strength. No cranial nerve deficit or sensory deficit. Coordination normal. GCS eye subscore is 4. GCS verbal subscore is 5. GCS motor subscore is 6.  Skin: Skin is warm, dry and intact. No rash noted. No cyanosis.  Psychiatric: He has a normal mood and affect. His speech is normal and  behavior is normal. Thought content normal.    ED Course  Procedures (including critical care time)   Date: 06/14/2012  Rate: 58  Rhythm: normal sinus rhythm  QRS Axis: left  Intervals: normal  ST/T Wave abnormalities: LVH with repolarization abn  Conduction Disutrbances:LVH with repolarization abn  Narrative Interpretation:   Old EKG Reviewed: none available    Labs Reviewed  CBC WITH DIFFERENTIAL - Abnormal; Notable for the following:    WBC 13.6 (*)    Hemoglobin 17.1 (*)    MCHC 36.5 (*)     Neutrophils Relative 94 (*)    Neutro Abs 12.8 (*)    Lymphocytes Relative 2 (*)    Lymphs Abs 0.3 (*)    All other components within normal limits  COMPREHENSIVE METABOLIC PANEL - Abnormal; Notable for the following:    Sodium 133 (*)    Chloride 95 (*)    Glucose, Bld 180 (*)    Total Bilirubin 1.3 (*)    All other components within normal limits  TROPONIN I  URINALYSIS, ROUTINE W REFLEX MICROSCOPIC  BASIC METABOLIC PANEL  CBC  BLOOD GAS, ARTERIAL   Ct Head Wo Contrast  06/14/2012  *RADIOLOGY REPORT*  Clinical Data: 57 year old old male with acute severe headache and blurred vision.  CT HEAD WITHOUT CONTRAST  Technique:  Contiguous axial images were obtained from the base of the skull through the vertex without contrast.  Comparison: None  Findings: A 3.6 x 5.8 x 6.5 cm (transverse X AP X CC) right temporoparietal intraparenchymal hematoma is identified with adjacent mass effect. 4 mm right to left midline shift is present without evidence of hydrocephalus.  Mild chronic small vessel white matter ischemic changes are noted. There is no evidence of extra-axial hemorrhage. No acute or suspicious bony abnormalities are present.  IMPRESSION: 3.6 x 5.8 x 6.5 cm right temporoparietal intraparenchymal hematoma with mass effect and 4 mm right to left midline shift.  Critical Value/emergent results were called by telephone at the time of interpretation on 06/14/2012 at 10:40 a.m. to Dr. Blinda Leatherwood, who verbally acknowledged these results.   Original Report Authenticated By: Harmon Pier, M.D.      Diagnosis: Intracranial Bleed    MDM  This presents to the ER for evaluation of headache. Patient reports a fairly sudden onset headache around 8 PM yesterday while at work. Patient was sent home from work because of the extent of his headache and he also had been complaining of visual disturbance. Patient's wife reports that he was up all night complaining of a headache. At arrival to the ER, patient is  awake and alert, but does seem to have difficulty understanding commands. Initially he had significant difficulty with strength testing, mainly on the left side. Some of this, however, was not functional deficit but rather confusion on the patient's part. I ultimately was able to get him to lift his left leg without difficulty and squeeze with the left hand and it was equal to the right.  Patient underwent CT scan which shows a large right temporoparietal intraparenchymal bleed 3.6 cm x 6.5 cm with 4 millimeter midline shift. This is consistent with a hypertensive bleed. Patient was bradycardic at arrival. He is supposed to be taking Lopressor, but has been off of his medications for 2 months. He is hypertensive as well. It's not clear if the hypertension is his chronic hypertension or if his hypertension and bradycardia is exhibiting some Cushing's reflex. Patient's hypertension treated with hydralazine. Neurosurgery consulted.  Gilda Crease, MD 06/14/12 1205  Gilda Crease, MD 06/14/12 [phone]

## 2012-06-14 NOTE — Consult Note (Signed)
PULMONARY  / CRITICAL CARE MEDICINE  Name: Luis Oconnor MRN: [id number] DOB: [birth year]    ADMISSION DATE:  06/14/2012 CONSULTATION DATE:  06/14/12  REFERRING MD :  Gerlene Fee  CHIEF COMPLAINT:  Fever post op   BRIEF PATIENT DESCRIPTION: 57 yo AAM with known hx of HTN admitted 3/16 with rght temporoparietal intracerebral hemorrhage s/p craniotomy w/ evacuation of hematoma . Post op developed fever 102. PCCM consult requested 3/16    SIGNIFICANT EVENTS / STUDIES:  CT head 3/16 >3.6 x 5.8 x 6.5 cm right temporoparietal intraparenchymal hematoma  with mass effect and 4 mm right to left midline shift. 3/16 s/p crani for evac of hematoma   LINES / TUBES:   CULTURES: 3/16 BC x 2 >>  ANTIBIOTICS:   HISTORY OF PRESENT ILLNESS:   HPI: 57 yo male with history of hypertension presented to ER after severe HA x 1 day . B/p >200  On arrival . He stopped his meds several  weeks prior to admit.  . CT head showed large superficial posterior right temporal bleed with some signs of increased ICP and 4 mm of midline shift. On arrival to ER with altered mental status changes /lethargy . He was taken to OR emergently by NS w/ craniotomy for evacuation of hematoma. He was extubated on [place] with adequate sats. Improved mentation with following simple commands . Moving both sides R>L . Post op he developed fever -tmax 102 , chills.  PCCM asked to consult for fever.     PAST MEDICAL HISTORY :  Past Medical History  Diagnosis Date  . Hypertension    History reviewed. No pertinent past surgical history. Prior to Admission medications   Medication Sig Start Date End Date Taking? Authorizing Provider  hydrochlorothiazide (HYDRODIURIL) 25 MG tablet Take 1 tablet (25 mg total) by mouth daily. 10/07/11 10/06/12  Remi Haggard, NP  metoprolol (LOPRESSOR) 100 MG tablet Take 0.5 tablets (50 mg total) by mouth 2 (two) times daily. 10/11/11 10/10/12  Carlisle Beers Molpus, MD  oxymetazoline (AFRIN) 0.05 % nasal spray Place 2  sprays into the nose 2 (two) times daily.    Historical Provider, MD   Allergies  Allergen Reactions  . Clonidine Derivatives Nausea Only    FAMILY HISTORY:  No family history on file. SOCIAL HISTORY:  reports that he has never smoked. He does not have any smokeless tobacco history on file. He reports that  drinks alcohol. He reports that he does not use illicit drugs.  REVIEW OF SYSTEMS:   Unable to obtain due to lethargy in post op unit   SUBJECTIVE:  Fever spike in post op 102.   VITAL SIGNS: Temp:  [98.4 F (36.9 C)-102 F (38.9 C)] 102 F (38.9 C) (03/16 1630) Pulse Rate:  [54-93] 87 (03/16 1615) Resp:  [14-24] 20 (03/16 1615) BP: (141-209)/(77-114) 150/81 mmHg (03/16 1615) SpO2:  [95 %-100 %] 99 % (03/16 1615) Arterial Line BP: (166-187)/(64-73) 187/64 mmHg (03/16 1615) HEMODYNAMICS:   VENTILATOR SETTINGS:   INTAKE / OUTPUT: Intake/Output     03/15 0701 - 03/16 0700 03/16 0701 - 03/17 0700   I.V.  1300   Total Intake   1300   Urine  225   Blood  150   Total Output   375   Net   +925          PHYSICAL EXAMINATION: General:  Sleepy , NAD  Neuro:  Alert, moving both side R>L , follows simple commands  sleepy HEENT: dry mucosa  Cardiovascular:  RRR  Lungs:  Faint Bibasilar crackles  Abdomen: soft NT, BS +  Musculoskeletal:  Intact , MAEW stronger grips on right  Skin:  Intact   LABS:  Recent Labs Lab 06/14/12 1040  HGB 17.1*  WBC 13.6*  PLT 220  NA 133*  K 3.9  CL 95*  CO2 23  GLUCOSE 180*  BUN 13  CREATININE 0.93  CALCIUM 9.5  AST 26  ALT 15  ALKPHOS 64  BILITOT 1.3*  PROT 7.7  ALBUMIN 3.9  TROPONINI <0.30   No results found for this basename: GLUCAP,  in the last 168 hours  CXR: pending   ASSESSMENT / PLAN:  PULMONARY A: no known active issues- fever ? Source  P:   Wean O2 for sat >92%  Check CXR   CARDIOVASCULAR A: Hx HTN -med noncompliance  P:  B/p goals per NS    RENAL A:  No active issues  P:   Monitor  Cont  IV fluids  Replace electrolytes as indicated   GASTROINTESTINAL A:  No active issues  P:   Monitor  PPI not indicated  NPO for now   HEMATOLOGIC A:  No signs of active bleeding  P:  SCD   INFECTIOUS A:  Fever ? Etiology  P:   Hold on abx for now  Check cxr , PCT  Tr temp/wbc    ENDOCRINE A:   P:   Monitor   NEUROLOGIC A:  Rght temporoparietal intracerebral hemorrhage s/p craniotomy w/ evacuation of hematoma 3/16  P:   Per NS  Monitor closely    TODAY'S SUMMARY: 57 yo AAM with known hx of HTN admitted 3/16 with rght temporoparietal intracerebral hemorrhage s/p craniotomy w/ evacuation of hematoma . Post op developed fever 102. ? Etiology , will hold abx for now Check PCT and cxr . Tr wbc and temp .     I have personally obtained a history, examined the patient, evaluated laboratory and imaging results, formulated the assessment and plan and placed orders. CRITICAL CARE: The patient is critically ill with multiple organ systems failure and requires high complexity decision making for assessment and support, frequent evaluation and titration of therapies, application of advanced monitoring technologies and extensive interpretation of multiple databases. Critical Care Time devoted to patient care services described in this note is  minutes.   PARRETT,TAMMY NP-C  Pulmonary and Critical Care Medicine [facility] Pager: [phone]  06/14/2012, 4:45 PM  I have seen and evaluated the patient. I agree with the history, exam, and planned as outlined above. Pt is hemodynamically stable; have brief episode of fever. The source is unclear but suspect central origin. CXray essentially clear, blood cultures pending. Will add urinalysis as well. At this time there is no indication for antibiotics; if patient develops an signs/symptoms of instability will readdress the antibiotic issue. I spent a total of 15 min providing care for this patient.

## 2012-06-14 NOTE — Anesthesia Procedure Notes (Signed)
Procedure Name: Intubation Date/Time: 06/14/2012 1:42 PM Performed by: Tyrone Nine Pre-anesthesia Checklist: Patient identified, Timeout performed, Emergency Drugs available, Suction available and Patient being monitored Patient Re-evaluated:Patient Re-evaluated prior to inductionOxygen Delivery Method: Circle system utilized Preoxygenation: Pre-oxygenation with 100% oxygen Intubation Type: IV induction, Rapid sequence and Cricoid Pressure applied Laryngoscope Size: Mac and 3 Grade View: Grade I Tube size: 7.5 mm Number of attempts: 1 Airway Equipment and Method: Stylet Placement Confirmation: ETT inserted through vocal cords under direct vision,  breath sounds checked- equal and bilateral and positive ETCO2 Secured at: 22 cm Tube secured with: Tape Dental Injury: Teeth and Oropharynx as per pre-operative assessment

## 2012-06-14 NOTE — Anesthesia Preprocedure Evaluation (Addendum)
Anesthesia Evaluation  Patient identified by MRN, date of birth, ID band Patient awake    Reviewed: Allergy & Precautions, H&P , NPO status , Unable to perform ROS - Chart review only  Airway Mallampati: II TM Distance: >3 FB Neck ROM: Limited    Dental  (+) Dental Advisory Given, Loose, Missing and Poor Dentition   Pulmonary neg pulmonary ROS,  breath sounds clear to auscultation  Pulmonary exam normal       Cardiovascular hypertension, Pt. on medications and Pt. on home beta blockers Rhythm:Regular Rate:Normal  Stopped taking his mmeds  06-13-12  14-Jun-2012 09:46:52 [facility]/ED ROUTINE RECORD SINUS RHYTHM ~ normal P axis, V-rate 50- 99 S1,S2,S3 PATTERN ~ S >32mS & >0.35mV, I II III LVH WITH SECONDARY REPOLARIZATION ABNORMALITY ~ R56L/RISIII/S12R56/S3RL & rep abn REPOL ABNRM, PROBABLE ISCHEMIA, DIFFUSE LEADS ~ ST dep, T neg, ant/lat/inf ANTERIOR ST ELEVATION, PROBABLY DUE TO LVH ~ ST >0.20 mV in V1-V4 & LVH Standard 12 Lead Report ~ Unconfirmed Interpretation Abnormal ECG 5mm/s 37mm/mV 150Hz  8.0.1 12SL 235 CID: [id number] Referred   Neuro/Psych  Headaches, IMPRESSION:  CT Head 06-14-12 3.6 x 5.8 x 6.5 cm right temporoparietal intraparenchymal hematoma with mass effect and 4 mm right to left midline shift.   Critical Value/emergent results were called by telephone at the time of interpretation on 06/14/2012 at 10:40 a.m. to Dr. Blinda Leatherwood, who verbally acknowledged these results.     Original Report     GI/Hepatic negative GI ROS, Neg liver ROS,   Endo/Other  negative endocrine ROS  Renal/GU negative Renal ROS     Musculoskeletal negative musculoskeletal ROS (+)   Abdominal   Peds  Hematology negative hematology ROS (+)   Anesthesia Other Findings   Reproductive/Obstetrics                       Anesthesia Physical Anesthesia Plan  ASA: III and emergent  Anesthesia Plan:  General   Post-op Pain Management:    Induction: Intravenous  Airway Management Planned: Oral ETT  Additional Equipment: Arterial line  Intra-op Plan:   Post-operative Plan: Extubation in OR  Informed Consent: I have reviewed the patients History and Physical, chart, labs and discussed the procedure including the risks, benefits and alternatives for the proposed anesthesia with the patient or authorized representative who has indicated his/her understanding and acceptance.   Dental advisory given  Plan Discussed with: CRNA and Surgeon  Anesthesia Plan Comments:         Anesthesia Quick Evaluation

## 2012-06-14 NOTE — Transfer of Care (Signed)
Immediate Anesthesia Transfer of Care Note  Patient: Luis Oconnor  Procedure(s) Performed: Procedure(s): CRANIOTOMY HEMATOMA EVACUATION SUBDURAL (Right)  Patient Location: PACU  Anesthesia Type:General  Level of Consciousness: awake, alert , oriented and pateint uncooperative  Airway & Oxygen Therapy: Patient Spontanous Breathing and Patient connected to face mask oxygen  Post-op Assessment: Report given to PACU RN, Post -op Vital signs reviewed and stable and Patient moving all extremities X 4  Post vital signs: Reviewed and stable  Complications: No apparent anesthesia complications

## 2012-06-14 NOTE — Anesthesia Postprocedure Evaluation (Signed)
  Anesthesia Post-op Note  Patient: Luis Oconnor  Procedure(s) Performed: Procedure(s): CRANIOTOMY HEMATOMA EVACUATION SUBDURAL (Right)  Patient Location: PACU  Anesthesia Type:General  Level of Consciousness: awake and confused  Airway and Oxygen Therapy: Patient Spontanous Breathing  Post-op Pain: mild  Post-op Assessment: Post-op Vital signs reviewed, Patient's Cardiovascular Status Stable and Respiratory Function Stable  Post-op Vital Signs: stable  Complications: No apparent anesthesia complications

## 2012-06-14 NOTE — H&P (Signed)
Luis Oconnor is an 57 y.o. male.   Chief Complaint: Cerebral hemorrhage HPI: 57 yo male with history of hypertension who stopped taking huis meds a while back. Now w onset of severe headache last night and came to [facility] this am. Ct head shows large hematoma posterior right temporal region. Neurosurgical consult requested.  Past Medical History  Diagnosis Date  . Hypertension     History reviewed. No pertinent past surgical history.  No family history on file. Social History:  reports that he has never smoked. He does not have any smokeless tobacco history on file. He reports that  drinks alcohol. He reports that he does not use illicit drugs.  Allergies:  Allergies  Allergen Reactions  . Clonidine Derivatives Nausea Only     (Not in a hospital admission)  Results for orders placed during the hospital encounter of 06/14/12 (from the past 48 hour(s))  CBC WITH DIFFERENTIAL     Status: Abnormal   Collection Time    06/14/12 10:40 AM      Result Value Range   WBC 13.6 (*) 4.0 - 10.5 K/uL   RBC 5.73  4.22 - 5.81 MIL/uL   Hemoglobin 17.1 (*) 13.0 - 17.0 g/dL   HCT 16.1  09.6 - 04.5 %   MCV 81.8  78.0 - 100.0 fL   MCH 29.8  26.0 - 34.0 pg   MCHC 36.5 (*) 30.0 - 36.0 g/dL   RDW 40.9  81.1 - 91.4 %   Platelets 220  150 - 400 K/uL   Neutrophils Relative 94 (*) 43 - 77 %   Neutro Abs 12.8 (*) 1.7 - 7.7 K/uL   Lymphocytes Relative 2 (*) 12 - 46 %   Lymphs Abs 0.3 (*) 0.7 - 4.0 K/uL   Monocytes Relative 4  3 - 12 %   Monocytes Absolute 0.6  0.1 - 1.0 K/uL   Eosinophils Relative 0  0 - 5 %   Eosinophils Absolute 0.0  0.0 - 0.7 K/uL   Basophils Relative 0  0 - 1 %   Basophils Absolute 0.0  0.0 - 0.1 K/uL  COMPREHENSIVE METABOLIC PANEL     Status: Abnormal   Collection Time    06/14/12 10:40 AM      Result Value Range   Sodium 133 (*) 135 - 145 mEq/L   Potassium 3.9  3.5 - 5.1 mEq/L   Chloride 95 (*) 96 - 112 mEq/L   CO2 23  19 - 32 mEq/L   Glucose, Bld 180 (*) 70 - 99  mg/dL   BUN 13  6 - 23 mg/dL   Creatinine, Ser 7.82  0.50 - 1.35 mg/dL   Calcium 9.5  8.4 - 95.6 mg/dL   Total Protein 7.7  6.0 - 8.3 g/dL   Albumin 3.9  3.5 - 5.2 g/dL   AST 26  0 - 37 U/L   ALT 15  0 - 53 U/L   Alkaline Phosphatase 64  39 - 117 U/L   Total Bilirubin 1.3 (*) 0.3 - 1.2 mg/dL   GFR calc non Af Amer >90  >90 mL/min   GFR calc Af Amer >90  >90 mL/min   Comment:            The eGFR has been calculated     using the CKD EPI equation.     This calculation has not been     validated in all clinical     situations.     eGFR's  persistently     <90 mL/min signify     possible Chronic Kidney Disease.  TROPONIN I     Status: None   Collection Time    06/14/12 10:40 AM      Result Value Range   Troponin I <0.30  <0.30 ng/mL   Comment:            Due to the release kinetics of cTnI,     a negative result within the first hours     of the onset of symptoms does not rule out     myocardial infarction with certainty.     If myocardial infarction is still suspected,     repeat the test at appropriate intervals.   Ct Head Wo Contrast  06/14/2012  *RADIOLOGY REPORT*  Clinical Data: 57 year old old male with acute severe headache and blurred vision.  CT HEAD WITHOUT CONTRAST  Technique:  Contiguous axial images were obtained from the base of the skull through the vertex without contrast.  Comparison: None  Findings: A 3.6 x 5.8 x 6.5 cm (transverse X AP X CC) right temporoparietal intraparenchymal hematoma is identified with adjacent mass effect. 4 mm right to left midline shift is present without evidence of hydrocephalus.  Mild chronic small vessel white matter ischemic changes are noted. There is no evidence of extra-axial hemorrhage. No acute or suspicious bony abnormalities are present.  IMPRESSION: 3.6 x 5.8 x 6.5 cm right temporoparietal intraparenchymal hematoma with mass effect and 4 mm right to left midline shift.  Critical Value/emergent results were called by telephone at  the time of interpretation on 06/14/2012 at 10:40 a.m. to Dr. Blinda Leatherwood, who verbally acknowledged these results.   Original Report Authenticated By: Harmon Pier, M.D.     Review of systems not obtained due to patient factors.  Blood pressure 192/108, pulse 87, temperature 98.4 F (36.9 C), temperature source Oral, resp. rate 24, SpO2 100.00%.  awake, but lethargic. Does move all four extremities, but drifts off to sleep if not strongly stimulated. Assessment/Plan CT reviewed and shows large superficial posterior right temporal bleed with some signs of increased ICP and 4 mm of midline shift. Patient is definitely lethargic, and doubt he will tolerate this for long. Therefore will take him to OR promptly for craniotomy for evacuation.  [name], MD 06/14/2012, 12:35 PM

## 2012-06-14 NOTE — Op Note (Signed)
Preop diagnosis: Right temporoparietal intracerebral hemorrhage Postop diagnosis: Same Procedure: Right temporoparietal craniotomy for evacuation of hematoma Surgeon: Amenda Duclos  After being placed the supine position with the head turned towards the left the patient's right scalp was prepped and draped in the usual sterile fashion. A curvilinear incision was made in the right posterior temporal parietal region started behind the ear pain up and slightly anterior. Hemostasis was obtained with unipolar coagulation. We dissected the pericranium off of the skull placed a self-retaining retractor for exposure. We then performed a single hole craniotomy without difficulty and removed the bone flap. We opened up the dura in a cruciate fashion and reflected the leaves of the dura without difficulty. We then coagulated the cortex went down approximately 1 cm and encountered the hematoma cavity. Large amounts of liquid and solid blood clot were encountered and evacuated which in excellent relaxation of the hemisphere. We irrigated copiously controlled any bleeders proper coagulation. Placed Gelfoam deep within the cavity and left there for a few minutes then removed. At that time there is no obvious bleeding. We lined The cavity with Surgicel stamps. We then closed the dura and standard fashion. We then reattached the bone flap with 3 small Lorenz plate and closed the scalp with inverted interrupted Vicryl on the galea and staples on the skin. Shortness was then applied and the patient was extubated and taken to recovery room in stable condition.

## 2012-06-14 NOTE — ED Notes (Addendum)
PT to neuro OR.Marland Kitchen

## 2012-06-14 NOTE — ED Notes (Addendum)
Pt.developed a headache last night at pm. Could not sleep.  10/10 pain throbbing in the center of forehead.  Blurred vision in the rt. Eye,  No other  neuro deficits.  Alert and oriented X4,   Pt. Has a hx of HTN, non compliant with medications for the last 2 months.  No nose bleeds.  Denies any any weakness. Denies any vomiting, diarheea

## 2012-06-14 NOTE — Preoperative (Addendum)
Beta Blockers   Lopressor 50 mgs Pt hasn't taken for "2 months"

## 2012-06-14 NOTE — Progress Notes (Signed)
Patient ID: Luis Oconnor, male   DOB: 11-Sep-1955, 57 y.o.   MRN: [id number] Intubated, sedated. Spoke with a cousin and showed the preop ct head. Difficult

## 2012-06-15 ENCOUNTER — Inpatient Hospital Stay (HOSPITAL_COMMUNITY): Payer: Medicaid Other

## 2012-06-15 ENCOUNTER — Encounter (HOSPITAL_COMMUNITY): Payer: Self-pay | Admitting: Neurosurgery

## 2012-06-15 DIAGNOSIS — I10 Essential (primary) hypertension: Secondary | ICD-10-CM

## 2012-06-15 DIAGNOSIS — I629 Nontraumatic intracranial hemorrhage, unspecified: Secondary | ICD-10-CM

## 2012-06-15 LAB — BASIC METABOLIC PANEL
CO2: 20 mEq/L (ref 19–32)
Chloride: 104 mEq/L (ref 96–112)
Glucose, Bld: 144 mg/dL — ABNORMAL HIGH (ref 70–99)
Potassium: 3.6 mEq/L (ref 3.5–5.1)
Sodium: 134 mEq/L — ABNORMAL LOW (ref 135–145)

## 2012-06-15 MED ORDER — ACETAMINOPHEN 80 MG PO CHEW
320.0000 mg | CHEWABLE_TABLET | ORAL | Status: DC | PRN
  Filled 2012-06-15: qty 4

## 2012-06-15 MED ORDER — HYDRALAZINE HCL 20 MG/ML IJ SOLN
10.0000 mg | INTRAMUSCULAR | Status: DC | PRN
  Administered 2012-06-16: 20 mg via INTRAVENOUS
  Administered 2012-06-16: 30 mg via INTRAVENOUS
  Administered 2012-06-16 – 2012-06-17 (×2): 20 mg via INTRAVENOUS
  Administered 2012-06-17: 40 mg via INTRAVENOUS
  Administered 2012-06-17: 30 mg via INTRAVENOUS
  Administered 2012-06-17 – 2012-06-19 (×3): 20 mg via INTRAVENOUS
  Administered 2012-06-20: 10 mg via INTRAVENOUS
  Administered 2012-06-21: 20 mg via INTRAVENOUS
  Filled 2012-06-15 (×3): qty 2
  Filled 2012-06-15 (×4): qty 1
  Filled 2012-06-15: qty 2
  Filled 2012-06-15 (×4): qty 1

## 2012-06-15 MED ORDER — SPIRONOLACTONE 25 MG PO TABS
25.0000 mg | ORAL_TABLET | Freq: Two times a day (BID) | ORAL | Status: DC
  Administered 2012-06-15 – 2012-06-17 (×4): 25 mg via ORAL
  Filled 2012-06-15 (×6): qty 1

## 2012-06-15 MED ORDER — ACETAMINOPHEN 650 MG RE SUPP
650.0000 mg | RECTAL | Status: DC | PRN
  Administered 2012-06-15 (×2): 650 mg via RECTAL
  Filled 2012-06-15 (×2): qty 1

## 2012-06-15 MED ORDER — ACETAMINOPHEN 325 MG PO TABS: 650.0000 mg | ORAL_TABLET | ORAL | Status: DC | PRN

## 2012-06-15 NOTE — Progress Notes (Signed)
UR completed 

## 2012-06-15 NOTE — Progress Notes (Signed)
Fever   CXR with no acute abnormality, UA with small leukocyte, BC pending.   Central fever?   Acetaminophen ordered. Cont to fu on symptoms and cultures. Low threshold to initiate antibiotics.

## 2012-06-15 NOTE — Progress Notes (Signed)
EKG change noted on monitor, ST segment elevated. Pt resting comfortably, denies CP. 2L [place] placed on pt. BP 124/56. Dr. Sung Amabile notified and aware of previous EKG results from 06/14/12.  STAT 12 lead EKG ordered. Will continue to monitor.   Holly Bodily

## 2012-06-15 NOTE — Progress Notes (Signed)
PULMONARY  / CRITICAL CARE MEDICINE  Name: DIMITRIY CARRERAS MRN: [id number] DOB: 1955-12-11    ADMISSION DATE:  06/14/2012 CONSULTATION DATE:  06/14/12  REFERRING MD :  Gerlene Fee  CHIEF COMPLAINT:  Fever post op   BRIEF PATIENT DESCRIPTION: 57 yo AAM with known hx of HTN admitted 3/16 with rght temporoparietal intracerebral hemorrhage s/p craniotomy w/ evacuation of hematoma . Post op developed fever 102. PCCM consult requested 3/16    SIGNIFICANT EVENTS / STUDIES:  3/16 CT head: 3.6 x 5.8 x 6.5 cm right temporoparietal intraparenchymal hematoma with mass effect and 4 mm right to left midline shift. 3/16 s/p crani for evac of hematoma   LINES / TUBES:   CULTURES: 3/16 BC x 2 >>  ANTIBIOTICS:   SUBJECTIVE:  C/O HA. No new complaints.   VITAL SIGNS: Temp:  [98.9 F (37.2 C)-102 F (38.9 C)] 100.8 F (38.2 C) (03/17 1230) Pulse Rate:  [69-98] 74 (03/17 1230) Resp:  [16-26] 24 (03/17 1230) BP: (103-174)/(49-93) 136/65 mmHg (03/17 1230) SpO2:  [95 %-100 %] 100 % (03/17 1230) Arterial Line BP: (142-239)/(57-91) 152/68 mmHg (03/17 0900) Weight:  [59 kg (130 lb 1.1 oz)] 59 kg (130 lb 1.1 oz) (03/16 2200) HEMODYNAMICS:   VENTILATOR SETTINGS:   INTAKE / OUTPUT: Intake/Output     03/16 0701 - 03/17 0700 03/17 0701 - 03/18 0700   I.V. (mL/kg) 3497.3 (59.3) 564.3 (9.6)   IV Piggyback 155    Total Intake(mL/kg) 3652.3 (61.9) 564.3 (9.6)   Urine (mL/kg/hr) 805 265 (0.8)   Blood 150    Total Output 955 265   Net +2697.3 +299.3          PHYSICAL EXAMINATION: General:  RASS 0, NAD  Neuro:  MAEs, + F/C HEENT: WNL  Cardiovascular:  RRR s M  Lungs: Clear  Abdomen: soft, NT, NABS  Ext: no edema, warm   LABS: BMET    Component Value Date/Time   NA 134* 06/15/2012 0524   K 3.6 06/15/2012 0524   CL 104 06/15/2012 0524   CO2 20 06/15/2012 0524   GLUCOSE 144* 06/15/2012 0524   BUN 21 06/15/2012 0524   CREATININE 1.24 06/15/2012 0524   CALCIUM 8.0* 06/15/2012 0524   GFRNONAA 62*  06/15/2012 0524   GFRAA 72* 06/15/2012 0524    CBC    Component Value Date/Time   WBC 12.3* 06/14/2012 1639   RBC 5.27 06/14/2012 1639   HGB 15.8 06/14/2012 1639   HCT 43.0 06/14/2012 1639   PLT 198 06/14/2012 1639   MCV 81.6 06/14/2012 1639   MCH 30.0 06/14/2012 1639   MCHC 36.7* 06/14/2012 1639   RDW 12.7 06/14/2012 1639   LYMPHSABS 0.3* 06/14/2012 1040   MONOABS 0.6 06/14/2012 1040   EOSABS 0.0 06/14/2012 1040   BASOSABS 0.0 06/14/2012 1040      CXR:  No new film  EKG: lateral repolarization abnormalities  ASSESSMENT: Hypertensive ICH after self discontinuation of BP meds Post craniotomy/evacuation 3/16 Severe hypertension - weaning off nicardipine Fever without evidence of infection - likely related to ICH  PLAN: Monitor in ICU  Wean off nicardipine for SBP > 160 mmHg PRN hydralazine and labetalol to maintain SBP < 160 mmHg D/C A-line 3/17 Passed SLP eval - begin diet Mobilize as allowed by NS   Billy Fischer, MD ; [facility] service Mobile [phone].  After 5:30 PM or weekends, call [phone]

## 2012-06-15 NOTE — Evaluation (Signed)
SLP reviewed and agree with student findings.   Ralonda Tartt MA, CCC-SLP [phone]    

## 2012-06-15 NOTE — Evaluation (Signed)
Clinical/Bedside Swallow Evaluation Patient Details  Name: Luis Oconnor MRN: [id number] Date of Birth: 12-Mar-1956  Today's Date: 06/15/2012 Time: 4098-1191 SLP Time Calculation (min): 17 min  Past Medical History:  Past Medical History  Diagnosis Date  . Hypertension    Past Surgical History: History reviewed. No pertinent past surgical history. HPI:  57 yo male with history of hypertension who presented with onset of severe headache 3/15 pm and came to [facility] 3/16 a.m. Ct head shows large hematoma posterior right temporal region, craniotomy w/ evacuation of hematoma performed 3/16.   Assessment / Plan / Recommendation Clinical Impression  Patient presents with a functional swallow with no overt s/s of aspiration observed at bedside. Patient is very lethargric however was able to sustain adequate LOA for po consumption. Patient reported he did not have an appetite. C/o head pain while chewing mechanical soft po's resulted in patient taking tiny bites and prolonged, but functional mastication. SLP recommends patient begin a regular diet with thin liquids with supervision for LOA. Diet recommendations and aspiration precautions communicated with RN,SLP will f/u next date for diet tolerance and increased LOA during po consumption before signing off.    Aspiration Risk  Mild    Diet Recommendation Regular;Thin liquid   Liquid Administration via: Straw;Cup Medication Administration: Whole meds with liquid Supervision: Patient able to self feed;Intermittent supervision to cue for compensatory strategies Compensations: Slow rate;Small sips/bites Postural Changes and/or Swallow Maneuvers: Seated upright 90 degrees;Upright 30-60 min after meal    Other  Recommendations Oral Care Recommendations: Oral care BID   Follow Up Recommendations  None    Frequency and Duration        Pertinent Vitals/Pain None reported    SLP Swallow Goals     Swallow Study Prior Functional Status        General Date of Onset: 06/14/12 HPI: 57 yo male with history of hypertension who presented with onset of severe headache 3/15 pm and came to [facility] 3/16 a.m. Ct head shows large hematoma posterior right temporal region, craniotomy w/ evacuation of hematoma performed 3/16. Type of Study: Bedside swallow evaluation Previous Swallow Assessment: none found Diet Prior to this Study: NPO Temperature Spikes Noted: Yes Respiratory Status: Room air History of Recent Intubation: Yes (for surgery 3/16) Length of Intubations (days):  (surgery 3/16) Behavior/Cognition: Lethargic;Requires cueing;Impulsive Oral Cavity - Dentition: Adequate natural dentition Self-Feeding Abilities: Able to feed self Patient Positioning: Upright in bed Baseline Vocal Quality: Clear Volitional Cough: Weak Volitional Swallow: Able to elicit    Oral/Motor/Sensory Function Overall Oral Motor/Sensory Function: Appears within functional limits for tasks assessed Labial ROM: Within Functional Limits Labial Symmetry: Within Functional Limits Labial Strength: Within Functional Limits Labial Sensation: Within Functional Limits Lingual ROM: Within Functional Limits Lingual Symmetry: Within Functional Limits Lingual Strength: Within Functional Limits Lingual Sensation: Within Functional Limits Facial ROM: Within Functional Limits Facial Symmetry: Within Functional Limits Facial Strength: Within Functional Limits Facial Sensation: Within Functional Limits Mandible: Within Functional Limits   Ice Chips Ice chips: Within functional limits Presentation: Spoon   Thin Liquid Thin Liquid: Within functional limits Presentation: Cup;Straw    Nectar Thick Nectar Thick Liquid: Not tested   Honey Thick Honey Thick Liquid: Not tested   Puree Puree: Within functional limits Presentation: Self Fed;Spoon   Solid   GO    Solid: Within functional limits Presentation: Self Melton Krebs, Jacob Chamblee 06/15/2012,12:16 PM

## 2012-06-15 NOTE — Progress Notes (Signed)
Patient ID: Luis Oconnor, male   DOB: November 11, 1955, 57 y.o.   MRN: [id number] Subjective: Patient reports feeling better  Objective: Vital signs in last 24 hours: Temp:  [98.9 F (37.2 C)-101.3 F (38.5 C)] 99.7 F (37.6 C) (03/17 1742) Pulse Rate:  [62-98] 74 (03/17 1600) Resp:  [16-26] 24 (03/17 1700) BP: (103-163)/(49-97) 151/97 mmHg (03/17 1700) SpO2:  [95 %-100 %] 99 % (03/17 1700) Arterial Line BP: (142-239)/(57-91) 152/68 mmHg (03/17 0900) Weight:  [59 kg (130 lb 1.1 oz)] 59 kg (130 lb 1.1 oz) (03/16 2200)  Intake/Output from previous day: 03/16 0701 - 03/17 0700 In: 3652.3 [I.V.:3497.3; IV Piggyback:155] Out: 955 [Urine:805; Blood:150] Intake/Output this shift: Total I/O In: 974.2 [I.V.:869.2; IV Piggyback:105] Out: 540 [Urine:540]  more awake, alert, conversant. No focal weakness  Lab Results:  Recent Labs  06/14/12 1040 06/14/12 1639  WBC 13.6* 12.3*  HGB 17.1* 15.8  HCT 46.9 43.0  PLT 220 198   BMET  Recent Labs  06/14/12 1639 06/15/12 0524  NA 133* 134*  K 3.7 3.6  CL 100 104  CO2 22 20  GLUCOSE 145* 144*  BUN 15 21  CREATININE 1.04 1.24  CALCIUM 8.5 8.0*    Studies/Results: Ct Head Wo Contrast  06/15/2012  *RADIOLOGY REPORT*  Clinical Data: Follow-up intracerebral hemorrhage.  CT HEAD WITHOUT CONTRAST  Technique:  Contiguous axial images were obtained from the base of the skull through the vertex without contrast.  Comparison: 06/14/2012.  Findings: Right temporal parietal craniotomy flap for intracerebral hematoma evacuation. Greater than 50% of the hematoma has been removed, and the overall hematoma has been significantly decompressed.  There is no new bleeding.  Pneumocephalus is present.  The midline shift has improved, now measuring 2 mm as compared to 4 mm previously.  Mild edema is noted surrounding the residual hematoma.  Improved right uncal displacement.  IMPRESSION: Improved appearance status post right temporal parietal craniotomy for  hematoma evacuation.   Original Report Authenticated By: Davonna Belling, M.D.    Ct Head Wo Contrast  06/14/2012  *RADIOLOGY REPORT*  Clinical Data: 57 year old old male with acute severe headache and blurred vision.  CT HEAD WITHOUT CONTRAST  Technique:  Contiguous axial images were obtained from the base of the skull through the vertex without contrast.  Comparison: None  Findings: A 3.6 x 5.8 x 6.5 cm (transverse X AP X CC) right temporoparietal intraparenchymal hematoma is identified with adjacent mass effect. 4 mm right to left midline shift is present without evidence of hydrocephalus.  Mild chronic small vessel white matter ischemic changes are noted. There is no evidence of extra-axial hemorrhage. No acute or suspicious bony abnormalities are present.  IMPRESSION: 3.6 x 5.8 x 6.5 cm right temporoparietal intraparenchymal hematoma with mass effect and 4 mm right to left midline shift.  Critical Value/emergent results were called by telephone at the time of interpretation on 06/14/2012 at 10:40 a.m. to Dr. Blinda Leatherwood, who verbally acknowledged these results.   Original Report Authenticated By: Harmon Pier, M.D.    Dg Chest Port 1v Same Day  06/14/2012  *RADIOLOGY REPORT*  Clinical Data: Intracranial hemorrhage.  Postop fever.  PORTABLE CHEST - 1 VIEW SAME DAY  Comparison: None  Findings: Upper limits normal heart size noted. This is a mildly low volume film. There is no evidence of focal airspace disease, pulmonary edema, suspicious pulmonary nodule/mass, pleural effusion, or pneumothorax. No acute bony abnormalities are identified.  IMPRESSION: Upper limits normal heart size without evidence of acute cardiopulmonary disease.  Original Report Authenticated By: Harmon Pier, M.D.     Assessment/Plan: Doing fairly well POD 1. BP being controlled by CCM. Ct head does show some residual blood low in temporal lobe, but much less mass effect and shift. Will start to increase activity tomorrow.   LOS: 1 day  as  abover   Luis Meeker, MD 06/15/2012, 5:54 PM

## 2012-06-16 NOTE — Progress Notes (Signed)
OT / PT Cancellation Note  Patient Details Name: Luis Oconnor MRN: [id number] DOB: 1955/05/08   Cancelled Treatment:    Reason Eval/Treat Not Completed: Patient not medically ready (c/o HA and vomitting- RN requesting therapy hold)  Lucile Shutters Pager: [phone]  06/16/2012, 11:42 AM

## 2012-06-16 NOTE — Clinical Social Work Note (Signed)
Clinical Social Work received referral for financial concerns from patient sister.  Patient sister questioning patient ability to receive Disability.  Patient sister not at bedside at this time.  CSW followed up with Marcelino Duster in [employer] who provided a form for MD to sign regarding Disability.  RN updated to relay message to family if needed.  CSW to follow up with patient sister and complete full assessment once family available.  Macario Golds, [place] [phone]

## 2012-06-16 NOTE — Progress Notes (Signed)
Speech Language Pathology Dysphagia Treatment Patient Details Name: Luis Oconnor MRN: [id number] DOB: 11-29-1955 Today's Date: 06/16/2012 Time: 7829-5621 SLP Time Calculation (min): 14 min  Assessment / Plan / Recommendation Clinical Impression  Treatment focused on skilled observation of diet tolerance. Pt was observed with p.m. meal and required max verbal, visual, and tactile cues from therapist to maintain adequate LOA. No overt s/s of aspiration were observed and pt appears to be tolerating current diet, however SLP recommends pt consume meals with supervision and increased LOA. No f/u recommended for dysphagia tx while in acute setting.     Diet Recommendation  Continue with Current Diet: Regular;Thin liquid    SLP Plan Discharge SLP treatment due to (comment) (no furher dysphagia tx needed)   Pertinent Vitals/Pain None reported   Swallowing Goals  SLP Swallowing Goals Patient will utilize recommended strategies during swallow to increase swallowing safety with: Maximum assistance Swallow Study Goal #2 - Progress: Met  General Temperature Spikes Noted: No Respiratory Status: Room air Behavior/Cognition: Lethargic;Impulsive;Distractible;Decreased sustained attention Oral Cavity - Dentition: Adequate natural dentition Patient Positioning: Upright in bed  Oral Cavity - Oral Hygiene     Dysphagia Treatment Treatment focused on: Skilled observation of diet tolerance;Patient/family/caregiver education Treatment Methods/Modalities: Skilled observation Patient observed directly with PO's: Yes Type of PO's observed: Dysphagia 3 (soft);Thin liquids Feeding: Able to feed self;Needs assist Liquids provided via: Straw Oral Phase Signs & Symptoms: Prolonged oral phase Type of cueing: Verbal;Visual;Tactile Amount of cueing: Maximal   GO   Berdine Dance SLP student  Berdine Dance 06/16/2012, 3:28 PM

## 2012-06-16 NOTE — Progress Notes (Signed)
Spoke with OT and agree. Izayah Miner B. Kodi Guerrera, PT, DPT #[phone]   

## 2012-06-16 NOTE — Progress Notes (Signed)
PULMONARY  / CRITICAL CARE MEDICINE  Name: Luis Oconnor MRN: [id number] DOB: 03-Apr-1955    ADMISSION DATE:  06/14/2012 CONSULTATION DATE:  06/14/12  REFERRING MD :  Gerlene Fee  CHIEF COMPLAINT:  Post op fever and hypertension  BRIEF PATIENT DESCRIPTION: 57 yo AAM with known hx of HTN admitted 3/16 with rght temporoparietal intracerebral hemorrhage s/p craniotomy w/ evacuation of hematoma . Post op developed fever and hypertension.    SIGNIFICANT EVENTS / STUDIES:  3/16 CT head: 3.6 x 5.8 x 6.5 cm right temporoparietal intraparenchymal hematoma with mass effect and 4 mm right to left midline shift. 3/16 s/p crani for evac of hematoma  3/17 CT head: Improved appearance status post right temporal parietal craniotomy after hematoma evacuation.  LINES / TUBES:   CULTURES: 3/16 BC x 2 >>  ANTIBIOTICS:   SUBJECTIVE:  C/O HA and LUE tingling.   VITAL SIGNS: Temp:  [98 F (36.7 C)-101.3 F (38.5 C)] 99.4 F (37.4 C) (03/18 1227) Pulse Rate:  [51-74] 51 (03/18 1530) Resp:  [16-25] 23 (03/18 1530) BP: (132-185)/(61-102) 167/82 mmHg (03/18 1530) SpO2:  [96 %-100 %] 99 % (03/18 1530) HEMODYNAMICS:   VENTILATOR SETTINGS:   INTAKE / OUTPUT: Intake/Output     03/17 0701 - 03/18 0700 03/18 0701 - 03/19 0700   I.V. (mL/kg) 1994.2 (33.8) 600 (10.2)   Other  240   IV Piggyback 210    Total Intake(mL/kg) 2204.2 (37.4) 840 (14.2)   Urine (mL/kg/hr) 2165 (1.5) 1125 (2.2)   Blood     Total Output 2165 1125   Net +39.2 -285        Urine Occurrence  1 x     PHYSICAL EXAMINATION: General:  RASS 0, NAD  Neuro:  MAEs, + F/C HEENT: WNL  Cardiovascular:  RRR s M  Lungs: Clear  Abdomen: soft, NT, NABS  Ext: no edema, warm   LABS: BMET    Component Value Date/Time   NA 134* 06/15/2012 0524   K 3.6 06/15/2012 0524   CL 104 06/15/2012 0524   CO2 20 06/15/2012 0524   GLUCOSE 144* 06/15/2012 0524   BUN 21 06/15/2012 0524   CREATININE 1.24 06/15/2012 0524   CALCIUM 8.0* 06/15/2012 0524    GFRNONAA 62* 06/15/2012 0524   GFRAA 72* 06/15/2012 0524    CBC    Component Value Date/Time   WBC 12.3* 06/14/2012 1639   RBC 5.27 06/14/2012 1639   HGB 15.8 06/14/2012 1639   HCT 43.0 06/14/2012 1639   PLT 198 06/14/2012 1639   MCV 81.6 06/14/2012 1639   MCH 30.0 06/14/2012 1639   MCHC 36.7* 06/14/2012 1639   RDW 12.7 06/14/2012 1639   LYMPHSABS 0.3* 06/14/2012 1040   MONOABS 0.6 06/14/2012 1040   EOSABS 0.0 06/14/2012 1040   BASOSABS 0.0 06/14/2012 1040      CXR:  No new film    ASSESSMENT: Hypertensive ICH after self discontinuation of BP meds Post craniotomy/evacuation 3/16 Severe hypertension - Controlled on oral medications Fever without evidence of infection - likely related to ICH  PLAN: Monitor in ICU  Cont current anti-hypertensive regimen PRN hydralazine and labetalol to maintain SBP < 160 mmHg Advance/encourage PO intake and activity   Billy Fischer, MD ; [facility] service Mobile [phone].  After 5:30 PM or weekends, call [phone]

## 2012-06-16 NOTE — Progress Notes (Signed)
SLP reviewed and agree with student findings.   Jermani Eberlein MA, CCC-SLP [phone]    

## 2012-06-16 NOTE — Progress Notes (Signed)
30 minutes after receiving 1 vicodin, pt reported h/a that was a  "100" on a scale of 1 to 10.  Dr. Gerlene Fee made aware and instructed to give 1 percocet and he would come see the pt when he was out of surgery.  Pt also complaining of tingling in his left arm.

## 2012-06-16 NOTE — Evaluation (Signed)
Speech Language Pathology Evaluation Patient Details Name: Luis Oconnor MRN: [id number] DOB: [birth year] Today's Date: 06/16/2012 Time: 2841-3244 SLP Time Calculation (min): 14 min  Problem List:  Patient Active Problem List  Diagnosis  . Malignant hypertension   Past Medical History:  Past Medical History  Diagnosis Date  . Hypertension    Past Surgical History:  Past Surgical History  Procedure Laterality Date  . Craniotomy Right 06/14/2012    Procedure: CRANIOTOMY HEMATOMA EVACUATION SUBDURAL;  Surgeon: [name], MD;  Location: [facility];  Service: Neurosurgery;  Laterality: Right;   HPI:  57 yo male with history of hypertension who presented with onset of severe headache 3/15 pm and came to [facility] 3/16 a.m. Ct head shows large hematoma posterior right temporal region, craniotomy w/ evacuation of hematoma performed 3/16.   Assessment / Plan / Recommendation Clinical Impression  Patient presents with severe cognitive deficits in the areas of focused and sustained attention and demonstrates moderate-severe impulsive behavior with perseveration during tasks. Severity of impairments interfere with patients reasoning, judgment, and problem solving skills. Pt's repetition and naming skills appear WFL, however L side visual cuts were noted effecting pt's ability to appropriately identify objects initially. Pt's participation was limited despite SLP  max verbal, visual, and tactile cues to maintain LOA. SLP will continue to f/u for cognitive tx while in acute setting.    SLP Assessment  Patient needs continued Speech Lanaguage Pathology Services    Follow Up Recommendations  Inpatient Rehab    Frequency and Duration min 2x/week  2 weeks   Pertinent Vitals/Pain None reported   SLP Goals  SLP Goals Potential to Achieve Goals: Good SLP Goal #1: Patient will sustain attention to mildly complex with moderate verbal/visual cues SLP Goal #1 - Progress:  (new goal) SLP Goal  #2: Patient will demonstrate awareness of objects in his left visual field with moderate verbal cues from therapist during a functional ADL. SLP Goal #2 - Progress:  (new goal)  SLP Evaluation Prior Functioning  Cognitive/Linguistic Baseline: Within functional limits   Cognition  Overall Cognitive Status: Impaired Arousal/Alertness: Lethargic Orientation Level: Oriented X4 Attention: Focused;Sustained Focused Attention: Impaired Focused Attention Impairment: Verbal basic Sustained Attention: Impaired Sustained Attention Impairment: Verbal basic Awareness: Impaired Awareness Impairment: Emergent impairment Problem Solving: Impaired Problem Solving Impairment: Functional basic Executive Function: Reasoning;Decision Making;Self Monitoring;Self Correcting Reasoning: Impaired Reasoning Impairment: Functional basic;Verbal basic Decision Making: Impaired Decision Making Impairment: Functional basic Self Monitoring: Impaired Self Monitoring Impairment: Functional basic Self Correcting: Impaired Self Correcting Impairment: Functional basic Behaviors: Impulsive;Restless;Perseveration Safety/Judgment: Impaired    Comprehension  Auditory Comprehension Overall Auditory Comprehension: Impaired Yes/No Questions: Not tested Commands: Impaired One Step Basic Commands: 25-49% accurate Interfering Components: Attention;Pain;Visual impairments;Processing speed Visual Recognition/Discrimination Discrimination: Not tested Reading Comprehension Reading Status: Not tested    Expression Expression Primary Mode of Expression: Verbal Verbal Expression Overall Verbal Expression: Appears within functional limits for tasks assessed Repetition: No impairment Naming: No impairment Written Expression Written Expression: Not tested   Oral / Motor Oral Motor/Sensory Function Overall Oral Motor/Sensory Function: Appears within functional limits for tasks assessed Labial ROM: Within Functional  Limits Labial Symmetry: Within Functional Limits Labial Strength: Within Functional Limits Labial Sensation: Within Functional Limits Lingual ROM: Within Functional Limits Lingual Symmetry: Within Functional Limits Lingual Strength: Within Functional Limits Lingual Sensation: Within Functional Limits Facial ROM: Within Functional Limits Facial Symmetry: Within Functional Limits Facial Strength: Within Functional Limits Facial Sensation: Within Functional Limits Velum: Within Functional Limits Mandible: Within Functional Limits  Motor Speech Overall Motor Speech: Appears within functional limits for tasks assessed Phonation: Normal Articulation: Within functional limitis Intelligibility: Intelligible   GO   Berdine Dance SLP student  Berdine Dance 06/16/2012, 3:54 PM

## 2012-06-16 NOTE — Evaluation (Signed)
SLP reviewed and agree with student findings.   Tegan Britain MA, CCC-SLP [phone]    

## 2012-06-17 DIAGNOSIS — R509 Fever, unspecified: Secondary | ICD-10-CM

## 2012-06-17 MED ORDER — AMLODIPINE BESYLATE 10 MG PO TABS
10.0000 mg | ORAL_TABLET | Freq: Every day | ORAL | Status: DC
  Administered 2012-06-18 – 2012-06-23 (×6): 10 mg via ORAL
  Filled 2012-06-17 (×7): qty 1

## 2012-06-17 MED ORDER — ENALAPRILAT 1.25 MG/ML IV SOLN
1.2500 mg | Freq: Four times a day (QID) | INTRAVENOUS | Status: DC | PRN
  Filled 2012-06-17: qty 1

## 2012-06-17 MED ORDER — SPIRONOLACTONE 50 MG PO TABS
50.0000 mg | ORAL_TABLET | Freq: Two times a day (BID) | ORAL | Status: DC
  Administered 2012-06-17 – 2012-06-23 (×12): 50 mg via ORAL
  Filled 2012-06-17 (×15): qty 1

## 2012-06-17 MED ORDER — AMLODIPINE BESYLATE 10 MG PO TABS
10.0000 mg | ORAL_TABLET | Freq: Once | ORAL | Status: AC
  Administered 2012-06-17: 10 mg via ORAL
  Filled 2012-06-17: qty 1

## 2012-06-17 MED ORDER — LABETALOL HCL 200 MG PO TABS
200.0000 mg | ORAL_TABLET | Freq: Two times a day (BID) | ORAL | Status: DC
  Administered 2012-06-17 – 2012-06-23 (×12): 200 mg via ORAL
  Filled 2012-06-17 (×14): qty 1

## 2012-06-17 NOTE — Progress Notes (Signed)
PULMONARY  / CRITICAL CARE MEDICINE  Name: Luis Oconnor MRN: [id number] DOB: Jan 04, 1956    ADMISSION DATE:  06/14/2012 CONSULTATION DATE:  06/14/12  REFERRING MD :  Gerlene Fee  CHIEF COMPLAINT:  Post op fever and hypertension  BRIEF PATIENT DESCRIPTION: 57 yo AAM with known hx of HTN admitted 3/16 with rght temporoparietal intracerebral hemorrhage s/p craniotomy w/ evacuation of hematoma . Post op developed fever and hypertension.    SIGNIFICANT EVENTS / STUDIES:  3/16 CT head: 3.6 x 5.8 x 6.5 cm right temporoparietal intraparenchymal hematoma with mass effect and 4 mm right to left midline shift. 3/16 s/p crani for evac of hematoma  3/17 CT head: Improved appearance status post right temporal parietal craniotomy after hematoma evacuation.  LINES / TUBES:   CULTURES: 3/16 BC x 2 >>  ANTIBIOTICS:   SUBJECTIVE:  C/O HA and LUE tingling.   VITAL SIGNS: Temp:  [97.8 F (36.6 C)-100.4 F (38 C)] 98.8 F (37.1 C) (03/19 0800) Pulse Rate:  [25-82] 82 (03/19 1600) Resp:  [12-25] 20 (03/19 1600) BP: (135-190)/(56-117) 173/95 mmHg (03/19 1600) SpO2:  [85 %-100 %] 98 % (03/19 1600) HEMODYNAMICS:   VENTILATOR SETTINGS:   INTAKE / OUTPUT: Intake/Output     03/18 0701 - 03/19 0700 03/19 0701 - 03/20 0700   P.O. 100    I.V. (mL/kg) 1800 (30.5) 600 (10.2)   Other 480    IV Piggyback 100    Total Intake(mL/kg) 2480 (42) 600 (10.2)   Urine (mL/kg/hr) 2125 (1.5) 450 (0.8)   Total Output 2125 450   Net +355 +150        Urine Occurrence 1 x      PHYSICAL EXAMINATION: General:  RASS 0, NAD  Neuro:  MAEs, + F/C HEENT: WNL  Cardiovascular:  RRR s M  Lungs: Clear  Abdomen: soft, NT, NABS  Ext: no edema, warm   LABS: BMET    Component Value Date/Time   NA 134* 06/15/2012 0524   K 3.6 06/15/2012 0524   CL 104 06/15/2012 0524   CO2 20 06/15/2012 0524   GLUCOSE 144* 06/15/2012 0524   BUN 21 06/15/2012 0524   CREATININE 1.24 06/15/2012 0524   CALCIUM 8.0* 06/15/2012 0524   GFRNONAA 62* 06/15/2012 0524   GFRAA 72* 06/15/2012 0524    CBC    Component Value Date/Time   WBC 12.3* 06/14/2012 1639   RBC 5.27 06/14/2012 1639   HGB 15.8 06/14/2012 1639   HCT 43.0 06/14/2012 1639   PLT 198 06/14/2012 1639   MCV 81.6 06/14/2012 1639   MCH 30.0 06/14/2012 1639   MCHC 36.7* 06/14/2012 1639   RDW 12.7 06/14/2012 1639   LYMPHSABS 0.3* 06/14/2012 1040   MONOABS 0.6 06/14/2012 1040   EOSABS 0.0 06/14/2012 1040   BASOSABS 0.0 06/14/2012 1040      CXR:  No new film    ASSESSMENT: Hypertensive ICH after self discontinuation of BP meds Post craniotomy/evacuation 3/16 Severe hypertension - still inadequately controlled Fever without evidence of infection - likely related to ICH. improving  PLAN: Cont to monitor in ICU  Begin amlodipine Change metoprolol to labetalol Cont PRN hydralazine to maintain SBP < 160 mmHg Add PRN enalaprilat to maintain SBP < 160 mmHg Advance/encourage PO intake and activity    Billy Fischer, MD ; [facility] service Mobile [phone].  After 5:30 PM or weekends, call [phone]

## 2012-06-17 NOTE — Clinical Social Work Psychosocial (Signed)
     Clinical Social Work Department BRIEF PSYCHOSOCIAL ASSESSMENT 06/17/2012  Patient:  Luis Oconnor, Luis Oconnor     Account Number:  [id number]     Admit date:  06/14/2012  Clinical Social Worker:  Verl Blalock  Date/Time:  06/17/2012 11:00 AM  Referred by:  RN  Date Referred:  06/16/2012 Referred for  Other - See comment   Other Referral:   Financial Concerns specific to the availability of Disability claim   Interview type:  Family Other interview type:   Patient in great deal of pain    PSYCHOSOCIAL DATA Living Status:  SIGNIFICANT OTHER Admitted from facility:   Level of care:   Primary support name:  Vito Berger   [phone] Primary support relationship to patient:  PARTNER Degree of support available:   Strong    CURRENT CONCERNS Current Concerns  Financial Resources  None Noted   Other Concerns:    SOCIAL WORK ASSESSMENT / PLAN Clinical Social Worker met with patient family at bedside to offer support and discuss patient needs at discharge. Patient sister, brother in law and girlfriend (18 years) all at bedside.  Patient currently lives with his girlfriend and per family report, plan is for patient to return once medically stable.  Patient family agreeable with home health services coming into the home if recommended by therapies.    Patient family had expressed concerns regarding financial needs and assistance.  CSW spoke with financial counselor Stanton Kidney) who states that patient does not qualify for Medicaid unless MD determines patient disabled greater than 12 months.  CSW notified MD.  CSW spoke with patient family at length regarding this concern and patient family plans to follow up several weeks following patient discharge. Patient family understanding that patient prognosis for long term is hard to tell at this time and willing to follow up again at a later date.  Patient with adequate family support and someone available to provide 24/7 support at discharge.   CSW signing off at this time due to no further social work needs identified.  Please reconsult if further needs arise.   Assessment/plan status:  No Further Intervention Required Other assessment/ plan:   Information/referral to community resources:   Clinical Social Worker notified financial counseling of family concerns and provided main line contact information for financial counseling department.  Patient family very appreciative and plan to follow up with financial counseling and patient accounts following patient discharge.    PATIENTS/FAMILYS RESPONSE TO PLAN OF CARE: Patient alert and oriented x3, however in a great deal of pain and unwilling to participate in conversation.  Patient currently lives with girlfriend of 18 years and has a very involved sister in patient care.  Patient with good home support.  Patient family verbalized all of their financial concerns and were appreciative of information given. Patient family plans to pursue "PCP" follow up at urgent care following neurosurgery follow up appointment.

## 2012-06-17 NOTE — Progress Notes (Signed)
Patient ID: Luis Oconnor, male   DOB: [birth year], 57 y.o.   MRN: [id number] Afeb. VSS. Still some issues with increased BP Awake, alert, conversant.  Follows complex commands.  Will recheck CT head tomorrow.

## 2012-06-17 NOTE — Evaluation (Signed)
Physical Therapy Evaluation Patient Details Name: Luis Oconnor MRN: [id number] DOB: 10-25-1955 Today's Date: 06/17/2012 Time: 4098-1191 PT Time Calculation (min): 23 min  PT Assessment / Plan / Recommendation Clinical Impression  57 y.o. male admitted to [facility] for R ICH s/p craniotomy and evacuation of hematoma.  He presents today with continued issues with BP management and 10/10 headache.  We were able to mobilize with mod assist to EOB and stand to urinate, but we were unable to progress mobility due to elevated BP outsideof physician's recommended parameters, so pt was positioned back supine in bed.      PT Assessment  Patient needs continued PT services    Follow Up Recommendations  CIR    Does the patient have the potential to tolerate intense rehabilitation     yes  Barriers to Discharge None None that we are currently aware of, home situation needs to be further assessed    Equipment Recommendations  None recommended by PT    Recommendations for Other Services Rehab consult   Frequency Min 4X/week    Precautions / Restrictions Precautions Precautions: Fall;Other (comment) Precaution Comments: BP currently must be SBP <160 per RN. RN states those are MD parametere, but he has not been there most of the day Restrictions Weight Bearing Restrictions: No   Pertinent Vitals/Pain 10/10 pain in right head, pre- medicated, repositioned     Mobility  Bed Mobility Bed Mobility: Supine to Sit;Sitting - Scoot to [place] of Bed;Sit to Supine Supine to Sit: 4: Min assist;HOB elevated Sitting - Scoot to [place] of Bed: 4: Min assist Sit to Supine: 4: Min assist;HOB flat Details for Bed Mobility Assistance: incr time, delayed response, continued v/c reinforcement Transfers Sit to Stand: 3: Mod assist;With upper extremity assist;From bed Stand to Sit: 3: Mod assist;With upper extremity assist;To bed Details for Transfer Assistance: posterior lean, unsteady, required support to remain  static standing Ambulation/Gait Ambulation/Gait Assistance: Not tested (comment) (due to BP to high)        PT Diagnosis: Difficulty walking;Abnormality of gait;Generalized weakness;Acute pain;Altered mental status  PT Problem List: Decreased strength;Decreased activity tolerance;Decreased balance;Decreased mobility;Decreased cognition;Decreased knowledge of use of DME;Decreased safety awareness;Pain;Cardiopulmonary status limiting activity PT Treatment Interventions: DME instruction;Gait training;Stair training;Functional mobility training;Therapeutic activities;Therapeutic exercise;Balance training;Neuromuscular re-education;Cognitive remediation;Patient/family education   PT Goals Acute Rehab PT Goals PT Goal Formulation: With patient Time For Goal Achievement: 07/01/12 Potential to Achieve Goals: Good Pt will go Supine/Side to Sit: with supervision PT Goal: Supine/Side to Sit - Progress: Goal set today Pt will go Sit to Supine/Side: with supervision PT Goal: Sit to Supine/Side - Progress: Goal set today Pt will go Sit to Stand: with supervision PT Goal: Sit to Stand - Progress: Goal set today Pt will go Stand to Sit: with supervision PT Goal: Stand to Sit - Progress: Goal set today Pt will Transfer Bed to Chair/Chair to Bed: with supervision PT Transfer Goal: Bed to Chair/Chair to Bed - Progress: Goal set today Pt will Ambulate: >150 feet;with min assist;with least restrictive assistive device PT Goal: Ambulate - Progress: Goal set today  Visit Information  Last PT Received On: 06/17/12 Assistance Needed: +2 PT/OT Co-Evaluation/Treatment: Yes    Subjective Data  Subjective: Pt reports HA and needs to urinate.   Patient Stated Goal: to decrease pain   Prior Functioning  Home Living Lives With: Other (Comment) (unknown at this time) Additional Comments: difficult to assess due to pain is consuming his thoughts.  He is slow to move and slow  to answer any questions during  treatment session.  Communication Communication: No difficulties Dominant Hand: Right    Cognition  Cognition Overall Cognitive Status: Difficult to assess Area of Impairment: Attention;Following commands;Awareness of errors Difficult to assess due to: Level of arousal Arousal/Alertness: Lethargic Behavior During Session: Lethargic Current Attention Level: Sustained Following Commands: Follows one step commands inconsistently;Follows one step commands with increased time Awareness of Errors: Assistance required to identify errors made Awareness of Errors - Other Comments: pt trying to assist with using the urinal, but often would make errors while attempting to use device    Extremity/Trunk Assessment Right Upper Extremity Assessment RUE ROM/Strength/Tone: Deficits;Due to impaired cognition Left Upper Extremity Assessment LUE ROM/Strength/Tone: Deficits;Due to impaired cognition Right Lower Extremity Assessment RLE ROM/Strength/Tone: Deficits RLE ROM/Strength/Tone Deficits: grossly 4/5 per functional assessment, however pain in head is overpowering his ability to move.   Left Lower Extremity Assessment LLE ROM/Strength/Tone: Deficits LLE ROM/Strength/Tone Deficits: grossly 4/5 per functional assessment, however pain in head is overpowering his ability to move.   Trunk Assessment Trunk Assessment: Normal   Balance Balance Balance Assessed: Yes Static Sitting Balance Static Sitting - Balance Support: No upper extremity supported;Feet supported Static Sitting - Level of Assistance: 3: Mod assist Static Standing Balance Static Standing - Balance Support: During functional activity Static Standing - Level of Assistance: 3: Mod assist  End of Session PT - End of Session Activity Tolerance: Patient limited by fatigue;Patient limited by pain;Treatment limited secondary to medical complications (Comment) (limited by high BP) Patient left: in bed;with call bell/phone within reach Nurse  Communication: Mobility status;Other (comment) (BP)    Zyair Rhein B. Makiyla Linch, PT, DPT [phone]   06/17/2012, 5:06 PM

## 2012-06-17 NOTE — Evaluation (Signed)
Occupational Therapy Evaluation Patient Details Name: Luis Oconnor MRN: [id number] DOB: 1955/05/08 Today's Date: 06/17/2012 Time: 4098-1191 OT Time Calculation (min): 18 min  OT Assessment / Plan / Recommendation Clinical Impression  57 yo male admitted with large hematoma posterior Rt temporal lobe s/p craniotomy with 4mm Rt to LT shift. Ot to follow acutely. Reommend CIR for d/c planning    OT Assessment  Patient needs continued OT Services    Follow Up Recommendations  CIR    Barriers to Discharge      Equipment Recommendations  Other (comment) (TBA)    Recommendations for Other Services Rehab consult  Frequency  Min 2X/week    Precautions / Restrictions Precautions Precautions: Fall;Other (comment) (high BP) Precaution Comments: BP currently must be SBP 160 Restrictions Weight Bearing Restrictions: No   Pertinent Vitals/Pain See vital    ADL  Transfers/Ambulation Related to ADLs: unable to ambulate due to SBP >180. Pt static standing due to urgency to void bladder and repeatly states I can only do it standing. Pt standing with cue to scoot to EOB ADL Comments: Pt required incr time to progress to EOB. Pt with delayed response. Pt motivated to participate by need to void bladder. Pt over shooting unable to visually attend to urinal placement. Pt with ataxic UB movement    OT Diagnosis: Generalized weakness;Acute pain;Ataxia  OT Problem List: Decreased strength;Decreased activity tolerance;Impaired balance (sitting and/or standing);Decreased safety awareness;Decreased knowledge of use of DME or AE;Decreased knowledge of precautions;Impaired UE functional use;Pain;Decreased range of motion;Decreased coordination;Impaired vision/perception;Decreased cognition OT Treatment Interventions: Self-care/ADL training;Therapeutic exercise;Neuromuscular education;DME and/or AE instruction;Therapeutic activities;Cognitive remediation/compensation;Patient/family education;Balance training    OT Goals Acute Rehab OT Goals OT Goal Formulation: Patient unable to participate in goal setting Time For Goal Achievement: 07/01/12 Potential to Achieve Goals: Good ADL Goals Pt Will Perform Grooming: with min assist;Supported;Sitting, chair ADL Goal: Grooming - Progress: Goal set today Miscellaneous OT Goals Miscellaneous OT Goal #1: Pt sitting EOB unsupported for 10 minutes to perform ADL OT Goal: Miscellaneous Goal #1 - Progress: Goal set today Miscellaneous OT Goal #2: Pt will complete bed mobility with 20 second delay to initiate and sustain task OT Goal: Miscellaneous Goal #2 - Progress: Goal set today  Visit Information  Last OT Received On: 06/17/12 Assistance Needed: +2    Subjective Data  Subjective: "I have to go to the bathroom" Patient Stated Goal: unable to participate    Prior Functioning     Home Living Lives With: Other (Comment) (unknown at this time) Communication Communication: No difficulties Dominant Hand: Right         Vision/Perception Vision - History Patient Visual Report: Other (comment) (to be further assessed) Vision - Assessment Eye Alignment:  (closed eyes majority of session) Vision Assessment: Vision impaired - to be further tested in functional context Additional Comments: Pt states "where is it? when told to reach for urinal with tactile input on inner thigh   Cognition  Cognition Overall Cognitive Status: Difficult to assess Area of Impairment: Attention;Following commands;Awareness of errors Difficult to assess due to: Level of arousal Arousal/Alertness: Lethargic Behavior During Session: Lethargic Current Attention Level: Sustained Following Commands: Follows one step commands inconsistently;Follows one step commands with increased time Awareness of Errors: Assistance required to identify errors made    Extremity/Trunk Assessment Right Upper Extremity Assessment RUE ROM/Strength/Tone: Deficits;Due to impaired  cognition Left Upper Extremity Assessment LUE ROM/Strength/Tone: Deficits;Due to impaired cognition Trunk Assessment Trunk Assessment: Normal     Mobility Bed Mobility Bed Mobility: Supine  to Sit;Sitting - Scoot to [place] of Bed;Sit to Supine Supine to Sit: 4: Min assist;HOB elevated Sitting - Scoot to [place] of Bed: 4: Min assist Sit to Supine: 4: Min assist;HOB flat Details for Bed Mobility Assistance: incr time, delayed response, continued v/c reinforcement Transfers Transfers: Sit to Stand;Stand to Sit Sit to Stand: 3: Mod assist;With upper extremity assist;From bed Stand to Sit: 3: Mod assist;With upper extremity assist;To bed Details for Transfer Assistance: posterior lean, unsteady, required support to remain static standing     Exercise     Balance Balance Balance Assessed: Yes Static Sitting Balance Static Sitting - Balance Support: No upper extremity supported;Feet supported Static Sitting - Level of Assistance: 3: Mod assist Static Standing Balance Static Standing - Balance Support: During functional activity Static Standing - Level of Assistance: 3: Mod assist   End of Session OT - End of Session Activity Tolerance: Treatment limited secondary to medication (SBP 180) Patient left: in bed;with call bell/phone within reach Nurse Communication: Mobility status;Precautions  GO     Lucile Shutters 06/17/2012, 4:29 PM Pager: [phone]

## 2012-06-17 NOTE — Progress Notes (Signed)
Dr Gerlene Fee notified of patient's elevated BP. MD asked RN to call CCM. Will continue to monitor.

## 2012-06-17 NOTE — Progress Notes (Signed)
CCM notified of patient's elevated BP. Amlodipine 10 mg PO ordered. Will give ASAP.

## 2012-06-17 NOTE — Progress Notes (Signed)
Rehab Admissions Coordinator Note:  Patient was screened by [name] for appropriateness for an Inpatient Acute Rehab Consult.  At this time, we are recommending Inpatient Rehab consult. Please order.  [name] 06/17/2012, 8:33 PM  I can be reached at [phone].

## 2012-06-18 DIAGNOSIS — R509 Fever, unspecified: Secondary | ICD-10-CM

## 2012-06-18 LAB — BASIC METABOLIC PANEL
GFR calc Af Amer: >90 mL/min (ref >90)
GFR calc non Af Amer: >90 mL/min (ref >90)
Glucose, Bld: 133 mg/dL — ABNORMAL HIGH (ref 70–99)
Potassium: 3.3 mEq/L — ABNORMAL LOW (ref 3.5–5.1)
Sodium: 128 mEq/L — ABNORMAL LOW (ref 135–145)

## 2012-06-18 LAB — CBC
Hemoglobin: 14.8 g/dL (ref 13.0–17.0)
RBC: 4.98 MIL/uL (ref 4.22–5.81)

## 2012-06-18 MED ORDER — WHITE PETROLATUM GEL
Status: AC
  Administered 2012-06-18: 0.2
  Filled 2012-06-18: qty 5

## 2012-06-18 MED ORDER — ENALAPRIL MALEATE 10 MG PO TABS
10.0000 mg | ORAL_TABLET | Freq: Every day | ORAL | Status: DC
  Administered 2012-06-18 – 2012-06-23 (×6): 10 mg via ORAL
  Filled 2012-06-18 (×7): qty 1

## 2012-06-18 NOTE — Progress Notes (Signed)
Physical Therapy Treatment Patient Details Name: Luis Oconnor MRN: [id number] DOB: April 18, 1955 Today's Date: 06/18/2012 Time: 4098-1191 PT Time Calculation (min): 23 min  PT Assessment / Plan / Recommendation Comments on Treatment Session  57 y.o. male admitted to [facility] with ICH s/p evacuation/craniotomy.  He presents today with continued HA, but better BPs, so we were able to progress his mobility.  His vision in his left eye is poor, but he did well with cues and compensation strategies.  He continues to be an excellent inpatient rehab candidate.  Sister is agreeable with this plan.      Follow Up Recommendations  CIR     Does the patient have the potential to tolerate intense rehabilitation    Yes  Barriers to Discharge  none      Equipment Recommendations  Rolling walker with 5" wheels    Recommendations for Other Services Rehab consult  Frequency Min 4X/week   Plan Discharge plan remains appropriate;Frequency remains appropriate    Precautions / Restrictions Precautions Precautions: Fall;Other (comment) Precaution Comments: BP currently must be SBP <160 per RN. RN states those are MD parametere, but he has not been there most of the day.  Left sided visual deficits must be cued to scan or turn more to see things on his left side.     Pertinent Vitals/Pain See vitals flow sheet    Mobility  Bed Mobility Supine to Sit: 4: Min assist;HOB flat;With rails Sitting - Scoot to Edge of Bed: 4: Min assist Sit to Supine: 4: Min assist Details for Bed Mobility Assistance: increased time needed to complete task, min assist for balance and to support trunk during transitions.   Transfers Sit to Stand: 3: Mod assist Stand to Sit: 3: Mod assist Details for Transfer Assistance: continues to have a posterior lean against the bed upon first standing, but once forward gait is initiated, pt stops leaning back.   Ambulation/Gait Ambulation/Gait Assistance: 1: +2 Total assist Ambulation/Gait:  Patient Percentage: 80% Ambulation Distance (Feet): 60 Feet Assistive device: 2 person hand held assist Ambulation/Gait Assistance Details: two person hand held assist to steady him for balance, cues needed to scan to the left side to see obstacles or to find papertowels while washing hands at the sink.  Pt leans and lists to the left.   Gait Pattern: Decreased stride length;Step-through pattern;Shuffle;Narrow base of support (staggering) Gait velocity: less than 1.8 ft/sec putting him at risk for recurrent falls.       PT Goals Acute Rehab PT Goals PT Goal: Supine/Side to Sit - Progress: Progressing toward goal PT Goal: Sit to Supine/Side - Progress: Progressing toward goal PT Goal: Sit to Stand - Progress: Progressing toward goal PT Goal: Stand to Sit - Progress: Progressing toward goal PT Goal: Ambulate - Progress: Progressing toward goal  Visit Information  Last PT Received On: 06/18/12 Assistance Needed: +2 (for safety and lines management)    Subjective Data  Subjective: Pt continues to report 10/10 HA, but willing to participate.  Sister in room helping with mobility   Cognition  Cognition Cognition - Other Comments: pt seems slow to process information, but overall he knew where he was and why he was here and was aware that his gait was unsteady.      Balance  Static Sitting Balance Static Sitting - Balance Support: Bilateral upper extremity supported;Feet supported Static Sitting - Level of Assistance: 5: Stand by assistance Static Standing Balance Static Standing - Balance Support: Bilateral upper extremity supported Static Standing -  Level of Assistance: 1: +2 Total assist;Patient percentage (comment) (80)  End of Session PT - End of Session Activity Tolerance: Patient limited by fatigue;Patient limited by pain Patient left: in bed;with call bell/phone within reach;with family/visitor present;with bed alarm set;with nursing in room Nurse Communication: Mobility status     Lurena Joiner B. Jonnell Hentges, PT, DPT [phone]   06/18/2012, 5:28 PM

## 2012-06-18 NOTE — Progress Notes (Signed)
Patient ID: Luis Oconnor, male   DOB: 03/12/1956, 57 y.o.   MRN: [id number] Subjective: Patient reports no complaints except the staples  Objective: Vital signs in last 24 hours: Temp:  [98.2 F (36.8 C)-99.5 F (37.5 C)] 98.2 F (36.8 C) (03/20 0818) Pulse Rate:  [54-98] 70 (03/20 0800) Resp:  [12-25] 21 (03/20 0845) BP: (147-190)/(63-107) 162/84 mmHg (03/20 0800) SpO2:  [93 %-100 %] 98 % (03/20 0800)  Intake/Output from previous day: 03/19 0701 - 03/20 0700 In: 2010 [I.V.:1800; IV Piggyback:210] Out: 1725 [Urine:1725] Intake/Output this shift: Total I/O In: 175 [P.O.:100; I.V.:75] Out: 200 [Urine:200]  Wound:clean and dry  Lab Results:  Recent Labs  06/18/12 0325  WBC 6.6  HGB 14.8  HCT 40.3  PLT 173   BMET  Recent Labs  06/18/12 0325  NA 128*  K 3.3*  CL 93*  CO2 25  GLUCOSE 133*  BUN 16  CREATININE 0.90  CALCIUM 8.4    Studies/Results: No results found.  Assessment/Plan: Remains stable. Will recheck ct head tomorrow. If looks ok, will transfer to floor once BP under control.  LOS: 4 days  as above   [name], MD 06/18/2012, 9:00 AM

## 2012-06-18 NOTE — Progress Notes (Signed)
PULMONARY  / CRITICAL CARE MEDICINE  Name: Luis Oconnor MRN: [id number] DOB: 10-12-1955    ADMISSION DATE:  06/14/2012 CONSULTATION DATE:  06/14/12  REFERRING MD :  Gerlene Fee  CHIEF COMPLAINT:  Post op fever and hypertension  BRIEF PATIENT DESCRIPTION: 57 yo AAM with known hx of HTN admitted 3/16 with rght temporoparietal intracerebral hemorrhage s/p craniotomy w/ evacuation of hematoma . Post op developed fever and hypertension.    SIGNIFICANT EVENTS / STUDIES:  3/16 CT head: 3.6 x 5.8 x 6.5 cm right temporoparietal intraparenchymal hematoma with mass effect and 4 mm right to left midline shift. 3/16 s/p crani for evac of hematoma  3/17 CT head: Improved appearance status post right temporal parietal craniotomy after hematoma evacuation. 3/18: Off nicardipine. Begin spironolactone. PRN hydralazine 3/19: change metoprolol to labetalol. Begin amlodipine 3/20: Add Vasotec   LINES / TUBES:   CULTURES: 3/16 BC x 2 >> NEG  ANTIBIOTICS:   SUBJECTIVE:  NSC  VITAL SIGNS: Temp:  [98.2 F (36.8 C)-100.1 F (37.8 C)] 100.1 F (37.8 C) (03/20 1200) Pulse Rate:  [58-98] 70 (03/20 0800) Resp:  [14-25] 20 (03/20 1100) BP: (138-186)/(63-106) 150/90 mmHg (03/20 1400) SpO2:  [93 %-100 %] 98 % (03/20 1400) HEMODYNAMICS:   VENTILATOR SETTINGS:   INTAKE / OUTPUT: Intake/Output     03/19 0701 - 03/20 0700 03/20 0701 - 03/21 0700   P.O.  420   I.V. (mL/kg) 1800 (30.5) 300 (5.1)   Other     IV Piggyback 210    Total Intake(mL/kg) 2010 (34.1) 720 (12.2)   Urine (mL/kg/hr) 1725 (1.2) 1215 (2.6)   Total Output 1725 1215   Net +285 -495        Urine Occurrence 4 x      PHYSICAL EXAMINATION: General:  RASS 0, NAD  Neuro:  MAEs, + F/C HEENT: WNL  Cardiovascular:  RRR s M  Lungs: Clear  Abdomen: soft, NT, NABS  Ext: no edema, warm   LABS: BMET    Component Value Date/Time   NA 128* 06/18/2012 0325   K 3.3* 06/18/2012 0325   CL 93* 06/18/2012 0325   CO2 25 06/18/2012 0325   GLUCOSE 133* 06/18/2012 0325   BUN 16 06/18/2012 0325   CREATININE 0.90 06/18/2012 0325   CALCIUM 8.4 06/18/2012 0325   GFRNONAA >90 06/18/2012 0325   GFRAA >90 06/18/2012 0325    CBC    Component Value Date/Time   WBC 6.6 06/18/2012 0325   RBC 4.98 06/18/2012 0325   HGB 14.8 06/18/2012 0325   HCT 40.3 06/18/2012 0325   PLT 173 06/18/2012 0325   MCV 80.9 06/18/2012 0325   MCH 29.7 06/18/2012 0325   MCHC 36.7* 06/18/2012 0325   RDW 12.2 06/18/2012 0325   LYMPHSABS 0.3* 06/14/2012 1040   MONOABS 0.6 06/14/2012 1040   EOSABS 0.0 06/14/2012 1040   BASOSABS 0.0 06/14/2012 1040      CXR:  No new film    ASSESSMENT: Hypertensive ICH after self discontinuation of BP meds Post craniotomy/evacuation 3/16 Severe hypertension - still inadequately controlled Low grade fever without evidence of infection. BCs negative  PLAN: Cont to monitor in ICU. Dr Gerlene Fee plans repeat Ct head 3/21 Cont amlodipine, labetalol, HCTZ, spironolactone Begin enalapril Cont PRN hydralazine to maintain SBP < 160 mmHg Cont PRN enalaprilat to maintain SBP < 160 mmHg Monitor electrolytes on diuretics, ACEI   Billy Fischer, MD ; [facility] service Mobile [phone].  After 5:30 PM or weekends, call [phone]

## 2012-06-19 ENCOUNTER — Inpatient Hospital Stay (HOSPITAL_COMMUNITY): Payer: Medicaid Other

## 2012-06-19 LAB — BASIC METABOLIC PANEL
CO2: 24 mEq/L (ref 19–32)
Glucose, Bld: 122 mg/dL — ABNORMAL HIGH (ref 70–99)
Potassium: 3.3 mEq/L — ABNORMAL LOW (ref 3.5–5.1)
Sodium: 125 mEq/L — ABNORMAL LOW (ref 135–145)

## 2012-06-19 MED ORDER — LEVETIRACETAM 500 MG PO TABS
500.0000 mg | ORAL_TABLET | Freq: Two times a day (BID) | ORAL | Status: DC
  Administered 2012-06-19 – 2012-06-21 (×4): 500 mg via ORAL
  Filled 2012-06-19 (×8): qty 1

## 2012-06-19 NOTE — Progress Notes (Signed)
Physical Therapy Treatment Patient Details Name: Luis Oconnor MRN: [id number] DOB: 1956-01-10 Today's Date: 06/19/2012 Time: 1191-4782 PT Time Calculation (min): 26 min  PT Assessment / Plan / Recommendation Comments on Treatment Session  57 y.o. male admitted to [facility] with ICH s/p evacuation/craniotomy.  Pt continues to be limited by pain and fatigue.  I think he needs to get up OOB during the day and start to mobilize more.  His HA pain is not disipating, but if he is more medicated it may make him more lethargic and sleepy.  PT to attempt to check back later to walk with RW and get OOB in recliner chair.      Follow Up Recommendations  CIR     Does the patient have the potential to tolerate intense rehabilitation    yes  Barriers to Discharge   none      Equipment Recommendations  Rolling walker with 5" wheels    Recommendations for Other Services Rehab consult  Frequency Min 4X/week   Plan Discharge plan remains appropriate;Frequency remains appropriate    Precautions / Restrictions Precautions Precautions: Fall;Other (comment) Precaution Comments: BP currently must be SBP <160 per RN. RN states those are MD parametere, but he has not been there most of the day.  Left sided visual deficits must be cued to scan or turn more to see things on his left side.   Restrictions Weight Bearing Restrictions: No   Pertinent Vitals/Pain BP stable and within recommended parameters    Mobility  Bed Mobility Bed Mobility: Supine to Sit;Sitting - Scoot to [place] of Bed;Sit to Supine Supine to Sit: 4: Min assist Sitting - Scoot to [place] of Bed: 4: Min assist Sit to Supine: 4: Min assist Details for Bed Mobility Assistance: min assist to support trunk so pt can pull with bil upper extremities to sitting.   Transfers Transfers: Sit to Stand;Stand to Sit Sit to Stand: 3: Mod assist;With upper extremity assist;From bed Stand to Sit: 3: Mod assist;With upper extremity assist;To bed Details for  Transfer Assistance: mod assist to support trunk for balance.  Pt leaning on bed for support in standing.   Ambulation/Gait Ambulation/Gait Assistance: Other (comment) (attempted, but pt reporting too lightheaded) Modified Rankin (Stroke Patients Only) Pre-Morbid Rankin Score: No symptoms Modified Rankin: Severe disability      PT Goals Acute Rehab PT Goals PT Goal: Supine/Side to Sit - Progress: Progressing toward goal PT Goal: Sit to Supine/Side - Progress: Progressing toward goal PT Goal: Sit to Stand - Progress: Progressing toward goal PT Goal: Stand to Sit - Progress: Progressing toward goal PT Goal: Ambulate - Progress: Not progressing  Visit Information  Last PT Received On: 06/19/12 Assistance Needed: +2 (safety and lines management)    Subjective Data  Subjective: Pt lying in bed, reporting he did not sleep last night due to not getting a sleeping pill.  He has been sleeping most of the day when I have been up here on 3100 unit.  I believe we need to try to get him up and awake for the day.  Pt agreeable to working with PT despite 10/10 HA pain.     Cognition  Cognition Area of Impairment: Attention;Following commands Arousal/Alertness: Lethargic Behavior During Session: Lethargic Current Attention Level: Sustained Following Commands: Follows one step commands inconsistently;Follows one step commands with increased time Awareness of Errors: Assistance required to identify errors made Awareness of Errors - Other Comments: pt is aware that he is off balance, but needs cues to  find obstacles on left side.  This may be a visual deficit, but carrover of his awareness of this deficit is poor from day to day.   Cognition - Other Comments: Pt continues to be slow to process information     Insurance risk surveyor Sitting - Balance Support: Bilateral upper extremity supported;Left upper extremity supported;Right upper extremity supported;Feet supported (bil or just  one upper extremity supported) Static Sitting - Level of Assistance: 4: Min assist Static Sitting - Comment/# of Minutes: sat EOB x 10 mins during functional activity of eating breakfast.  Pt switching back and forth between one hand and no hand support to eat.  Needs cues to find things on the left side of his tray. Pt also leaning over and holding his head periodically.   Static Standing Balance Static Standing - Balance Support: Right upper extremity supported Static Standing - Level of Assistance: 3: Mod assist  End of Session PT - End of Session Equipment Utilized During Treatment: Gait belt Activity Tolerance: Patient limited by fatigue;Patient limited by pain Patient left: in bed;with call bell/phone within reach;with bed alarm set Nurse Communication: Mobility status;Other (comment) (need for gait and OOB)       Johanny Segers B. Zhion Pevehouse, PT, DPT [phone]   06/19/2012, 9:57 AM

## 2012-06-19 NOTE — Progress Notes (Signed)
Pt. seemingly more lethargic and slumping in chair as if it is the bed.  Got patient back to bed with assistance.  Pt. not as oriented as before.  Unable to vocalize where he is at this time and why he is here.  When I reminded him, he seemed surprised.  Paged Dr. Franky Macho in the O.R., he stated that his CT results were fine and that he would come look at the patient as soon as he could.  No other neuro changes at this time, pupils still equal and reactive and moving all extremities the same as previously.  Will continue to monitor patient.

## 2012-06-19 NOTE — Progress Notes (Signed)
PULMONARY  / CRITICAL CARE MEDICINE  Name: Luis Oconnor MRN: [id number] DOB: January 12, 1956    ADMISSION DATE:  06/14/2012 CONSULTATION DATE:  06/14/12  REFERRING MD :  Gerlene Fee  CHIEF COMPLAINT:  Post op fever and hypertension  BRIEF PATIENT DESCRIPTION: 57 yo AAM with known hx of HTN admitted 3/16 with rght temporoparietal intracerebral hemorrhage s/p craniotomy w/ evacuation of hematoma . Post op developed fever and hypertension.    SIGNIFICANT EVENTS / STUDIES:  3/16 CT head: 3.6 x 5.8 x 6.5 cm right temporoparietal intraparenchymal hematoma with mass effect and 4 mm right to left midline shift. 3/16 s/p crani for evac of hematoma  3/17 CT head: Improved appearance status post right temporal parietal craniotomy after hematoma evacuation. 3/18: Off nicardipine. Begin spironolactone. PRN hydralazine 3/19: change metoprolol to labetalol. Begin amlodipine 3/20: Add Vasotec 3/21: CT head: NSC to slightly improved  LINES / TUBES:   CULTURES: 3/16 BC x 2 >> NEG  ANTIBIOTICS:   SUBJECTIVE:  No new complaints. No distress  VITAL SIGNS: Temp:  [98.1 F (36.7 C)-100.1 F (37.8 C)] 98.1 F (36.7 C) (03/21 0413) Pulse Rate:  [57-101] 101 (03/21 0911) Resp:  [14-26] 18 (03/21 0800) BP: (135-184)/(70-102) 153/83 mmHg (03/21 0911) SpO2:  [90 %-98 %] 98 % (03/21 0911) HEMODYNAMICS:   VENTILATOR SETTINGS:   INTAKE / OUTPUT: Intake/Output     03/20 0701 - 03/21 0700 03/21 0701 - 03/22 0700   P.O. 780 100   I.V. (mL/kg) 300 (5.1)    IV Piggyback 210    Total Intake(mL/kg) 1290 (21.9) 100 (1.7)   Urine (mL/kg/hr) 2345 (1.7)    Total Output 2345     Net -1055 +100          PHYSICAL EXAMINATION: General:  RASS 0, NAD  Neuro:  MAEs, + F/C HEENT: WNL  Cardiovascular:  RRR s M  Lungs: Clear  Abdomen: soft, NT, NABS  Ext: no edema, warm   LABS: BMET    Component Value Date/Time   NA 125* 06/19/2012 0327   K 3.3* 06/19/2012 0327   CL 88* 06/19/2012 0327   CO2 24  06/19/2012 0327   GLUCOSE 122* 06/19/2012 0327   BUN 17 06/19/2012 0327   CREATININE 0.91 06/19/2012 0327   CALCIUM 8.7 06/19/2012 0327   GFRNONAA >90 06/19/2012 0327   GFRAA >90 06/19/2012 0327    CBC    Component Value Date/Time   WBC 6.6 06/18/2012 0325   RBC 4.98 06/18/2012 0325   HGB 14.8 06/18/2012 0325   HCT 40.3 06/18/2012 0325   PLT 173 06/18/2012 0325   MCV 80.9 06/18/2012 0325   MCH 29.7 06/18/2012 0325   MCHC 36.7* 06/18/2012 0325   RDW 12.2 06/18/2012 0325   LYMPHSABS 0.3* 06/14/2012 1040   MONOABS 0.6 06/14/2012 1040   EOSABS 0.0 06/14/2012 1040   BASOSABS 0.0 06/14/2012 1040    CXR:  No new film    ASSESSMENT: Hypertensive ICH after self discontinuation of BP meds Post craniotomy/evacuation 3/16 Severe hypertension - BP within desired range Low grade fever without evidence of infection. BCs negative  PLAN: Cont amlodipine, labetalol, HCTZ, spironolactone, enalapril Cont PRN hydralazine to maintain SBP < 160 mmHg Cont PRN enalaprilat to maintain SBP < 160 mmHg Monitor electrolytes on diuretics, ACEI OK to transfer out of ICU from PCCM perspective PCCM will sign off. Please call if we can be of further assistance   Billy Fischer, MD ; [facility] [phone].  After 5:30 PM or  weekends, call [phone]

## 2012-06-19 NOTE — Progress Notes (Signed)
Physical Therapy Treatment Patient Details Name: Luis Oconnor MRN: [id number] DOB: [birth year] Today's Date: 06/19/2012 Time: 1208-1229 PT Time Calculation (min): 21 min  PT Assessment / Plan / Recommendation Comments on Treatment Session  57 y.o. male admitted to [facility] with ICH s/p evacuation/craniotomy.  I came back before lunch to try RW for gait.  It seems to get in his way more than it helps.  We did better yesterday with two person hand held assist.  Pt in now up in recliner chair with soft lights on.  He has been sleeping in the bed all morning and I think he could benefit from being upright in the chair.      Follow Up Recommendations  CIR     Does the patient have the potential to tolerate intense rehabilitation    yes  Barriers to Discharge   none      Equipment Recommendations  None recommended by PT    Recommendations for Other Services   none  Frequency Min 4X/week   Plan Discharge plan remains appropriate;Frequency remains appropriate    Precautions / Restrictions Precautions Precautions: Fall;Other (comment) Precaution Comments: BP currently must be SBP <160 per RN. RN states those are MD parameterer.  Left sided visual deficits must be cued to scan or turn more to see things on his left side.     Pertinent Vitals/Pain Reports 10/10 headache pain (this has been unchanged since surgery)    Mobility  Bed Mobility Bed Mobility: Supine to Sit;Sitting - Scoot to [place] of Bed;Sit to Supine Supine to Sit: 4: Min assist Sitting - Scoot to [place] of Bed: 4: Min assist Sit to Supine: 4: Min assist Details for Bed Mobility Assistance: min assist to support trunk so pt can pull with bil upper extremities to sitting.   Transfers Transfers: Sit to Stand;Stand to Sit Sit to Stand: 3: Mod assist;With upper extremity assist;From bed Stand to Sit: 3: Mod assist;With upper extremity assist;To bed Stand Pivot Transfers: 3: Mod assist Details for Transfer Assistance: mod assist to  support trunk for balance.  Pt leaning on bed for support in standing.  Mod assist to support trunk for balance while using RW to turn to recliner chair.  Manual assist needed for left hand placement on RW handle on the left side.  Once there it stayed, but could not find it on his own.   Ambulation/Gait Ambulation/Gait Assistance: 3: Mod assist Ambulation Distance (Feet): 5 Feet Assistive device: Rolling walker Ambulation/Gait Assistance Details: mod assist to support trunk for balance, pt with bil elbows extended, trunk flexed and wide BOS (both feet ouside of RW).  I beleive we did better yesterday with two person hand held assist.  This is a new task an presenting very difficult for pt.   Gait Pattern: Decreased step length - right;Decreased step length - left;Step-to pattern;Shuffle;Wide base of support      PT Goals Acute Rehab PT Goals PT Goal: Supine/Side to Sit - Progress: Progressing toward goal PT Goal: Sit to Supine/Side - Progress: Progressing toward goal PT Goal: Sit to Stand - Progress: Progressing toward goal PT Goal: Stand to Sit - Progress: Progressing toward goal PT Transfer Goal: Bed to Chair/Chair to Bed - Progress: Progressing toward goal PT Goal: Ambulate - Progress: Progressing toward goal  Visit Information  Last PT Received On: 06/19/12 Assistance Needed: +2    Subjective Data  Subjective: Pt still lying in bed, eyes closed.  Agreeable to get up and try RW.  Cognition  Cognition Area of Impairment: Attention;Following commands Arousal/Alertness: Lethargic Behavior During Session: Lethargic Current Attention Level: Sustained Following Commands: Follows one step commands inconsistently;Follows one step commands with increased time Awareness of Errors: Assistance required to identify errors made Awareness of Errors - Other Comments: pt is aware that he is off balance, but needs cues to find obstacles on left side.  This may be a visual deficit, but carrover  of his awareness of this deficit is poor from day to day.   Cognition - Other Comments: Pt continues to be slow to process information        End of Session PT - End of Session Equipment Utilized During Treatment: Gait belt Activity Tolerance: Patient limited by fatigue;Patient limited by pain Patient left: in chair;with call bell/phone within reach     Califon B. Anders Hohmann, PT, DPT [phone]   06/19/2012, 3:42 PM

## 2012-06-19 NOTE — Consult Note (Signed)
Physical Medicine and Rehabilitation Consult Reason for Consult: Blurred vision, balance deficits, and impaired cognition.   Referring Physician: Dr. Franky Macho.    HPI: Luis Oconnor is a 57 y.o. male with history of HTN-non compliant with medications. Was admitted on 06/14/12 with right sided headache, blurred vision and confusion. CT head done revealing 3.6 x 5.8 x 6.5 cm right temporoparietal intraparenchymal hematoma with mass effect and 4 mm right to left midline shift. Dr. Gerlene Fee was consulted and patient taken to OR emergently for right temporoparietal craniotomy for evacuation of hematoma. Post op with fever and elevated BP.  Fever monitored and likely due to ICH. CCM following for management of medical issues/BP management. PT/OT evaluations done and patient limited by visual deficits as well as poor attention, ataxia and balance deficits. Therapy team/MD recommending CIR.   Review of Systems  Unable to perform ROS: mental acuity   Past Medical History  Diagnosis Date  . Hypertension    Past Surgical History  Procedure Laterality Date  . Craniotomy Right 06/14/2012    Procedure: CRANIOTOMY HEMATOMA EVACUATION SUBDURAL;  Surgeon: [name], MD;  Location: [facility];  Service: Neurosurgery;  Laterality: Right;   No family history on file.  Social History:  reports that he has never smoked. He does not have any smokeless tobacco history on file. He reports that  drinks alcohol. He reports that he does not use illicit drugs.   Allergies  Allergen Reactions  . Clonidine Derivatives Nausea Only   Medications Prior to Admission  Medication Sig Dispense Refill  . hydrochlorothiazide (HYDRODIURIL) 25 MG tablet Take 1 tablet (25 mg total) by mouth daily.  30 tablet  0  . metoprolol (LOPRESSOR) 100 MG tablet Take 0.5 tablets (50 mg total) by mouth 2 (two) times daily.  30 tablet  0  . oxymetazoline (AFRIN) 0.05 % nasal spray Place 2 sprays into the nose 2 (two) times daily.         Home: Home Living Lives With: Other (Comment) (unknown at this time) Additional Comments: difficult to assess due to pain is consuming his thoughts.  He is slow to move and slow to answer any questions during treatment session.   Functional History:   Functional Status:  Mobility: Bed Mobility Bed Mobility: Supine to Sit;Sitting - Scoot to [place] of Bed;Sit to Supine Supine to Sit: 4: Min assist Sitting - Scoot to [place] of Bed: 4: Min assist Sit to Supine: 4: Min assist Transfers Transfers: Sit to Stand;Stand to Sit Sit to Stand: 3: Mod assist;With upper extremity assist;From bed Stand to Sit: 3: Mod assist;With upper extremity assist;To bed Ambulation/Gait Ambulation/Gait Assistance: Other (comment) (attempted, but pt reporting too lightheaded) Ambulation/Gait: Patient Percentage: 80% Ambulation Distance (Feet): 60 Feet Assistive device: 2 person hand held assist Ambulation/Gait Assistance Details: two person hand held assist to steady him for balance, cues needed to scan to the left side to see obstacles or to find papertowels while washing hands at the sink.  Pt leans and lists to the left.   Gait Pattern: Decreased stride length;Step-through pattern;Shuffle;Narrow base of support (staggering) Gait velocity: less than 1.8 ft/sec putting him at risk for recurrent falls.      ADL: ADL Transfers/Ambulation Related to ADLs: unable to ambulate due to SBP >180. Pt static standing due to urgency to void bladder and repeatly states I can only do it standing. Pt standing with cue to scoot to EOB ADL Comments: Pt required incr time to progress to EOB. Pt  with delayed response. Pt motivated to participate by need to void bladder. Pt over shooting unable to visually attend to urinal placement. Pt with ataxic UB movement  Cognition: Cognition Overall Cognitive Status: Impaired Arousal/Alertness: Lethargic Orientation Level: Oriented X4 Attention: Focused;Sustained Focused Attention:  Impaired Focused Attention Impairment: Verbal basic Sustained Attention: Impaired Sustained Attention Impairment: Verbal basic Awareness: Impaired Awareness Impairment: Emergent impairment Problem Solving: Impaired Problem Solving Impairment: Functional basic Executive Function: Reasoning;Decision Making;Self Monitoring;Self Correcting Reasoning: Impaired Reasoning Impairment: Functional basic;Verbal basic Decision Making: Impaired Decision Making Impairment: Functional basic Self Monitoring: Impaired Self Monitoring Impairment: Functional basic Self Correcting: Impaired Self Correcting Impairment: Functional basic Behaviors: Impulsive;Restless;Perseveration Safety/Judgment: Impaired Cognition Overall Cognitive Status: Difficult to assess Area of Impairment: Attention;Following commands Difficult to assess due to: Level of arousal Arousal/Alertness: Lethargic Behavior During Session: Lethargic Current Attention Level: Sustained Following Commands: Follows one step commands inconsistently;Follows one step commands with increased time Awareness of Errors: Assistance required to identify errors made Awareness of Errors - Other Comments: pt is aware that he is off balance, but needs cues to find obstacles on left side.  This may be a visual deficit, but carrover of his awareness of this deficit is poor from day to day.   Cognition - Other Comments: Pt continues to be slow to process information   Blood pressure 153/83, pulse 101, temperature 98.7 F (37.1 C), temperature source Oral, resp. rate 18, height 5\' 4"  (1.626 m), weight 59 kg (130 lb 1.1 oz), SpO2 98.00%. Physical Exam  Nursing note and vitals reviewed. Constitutional: He appears well-developed and well-nourished. He appears lethargic.  HENT:  Head: Normocephalic.  Right crani incision with staples in place.   Neck: Normal range of motion. Neck supple.  Cardiovascular: Normal rate and regular rhythm.   Pulmonary/Chest:  Effort normal and breath sounds normal.  Abdominal: Soft. Bowel sounds are normal.  Musculoskeletal: He exhibits no edema.  Neurological: He appears lethargic.  Lethargic. Perseverating on his name-oriented to self only.  Restless. LLE weakness noted. Would not consistently participate in exam. Does complain occasionally about pain. Speech very dysarthric, low volume     Skin: Skin is warm and dry.    Results for orders placed during the hospital encounter of 06/14/12 (from the past 24 hour(s))  BASIC METABOLIC PANEL     Status: Abnormal   Collection Time    06/19/12  3:27 AM      Result Value Range   Sodium 125 (*) 135 - 145 mEq/L   Potassium 3.3 (*) 3.5 - 5.1 mEq/L   Chloride 88 (*) 96 - 112 mEq/L   CO2 24  19 - 32 mEq/L   Glucose, Bld 122 (*) 70 - 99 mg/dL   BUN 17  6 - 23 mg/dL   Creatinine, Ser 4.09  0.50 - 1.35 mg/dL   Calcium 8.7  8.4 - 81.1 mg/dL   GFR calc non Af Amer >90  >90 mL/min   GFR calc Af Amer >90  >90 mL/min   Ct Head Wo Contrast  06/19/2012  *RADIOLOGY REPORT*  Clinical Data: Follow-up hemorrhage.  Complains of headache and dizziness.  CT HEAD WITHOUT CONTRAST  Technique:  Contiguous axial images were obtained from the base of the skull through the vertex without contrast.  Comparison: 06/15/2012  Findings: Postoperative changes with right parietal craniotomy. There is gas in the subcutaneous tissues and inferior to the bone flap consistent with postoperative change.  Right temporal intraparenchymal hematoma appears stable since previous study. Residual edema is present  with evidence of decreased parenchymal hemorrhage in the posterior parietal region.  This may be due to typical evolution.  There is residual mass effect with sulci effacement and right to left midline shift of about 2 mm.  This appears stable.  No new foci of hemorrhage are identified.  IMPRESSION: Stable postoperative changes in the right posterior parietal region with underlying intraparenchymal  hematoma in the right temporoparietal region.  This may be slightly smaller than on previous study.  Surrounding edema remains present.  Mass effect with residual 2 mm midline shift, similar to previous study.   Original Report Authenticated By: Burman Nieves, M.D.     Assessment/Plan: Diagnosis: left temporal-parietal ICH 1. Does the need for close, 24 hr/day medical supervision in concert with the patient's rehab needs make it unreasonable for this patient to be served in a less intensive setting? Yes 2. Co-Morbidities requiring supervision/potential complications: htn 3. Due to bladder management, bowel management, safety, skin/wound care, disease management, medication administration, pain management and patient education, does the patient require 24 hr/day rehab nursing? Yes and Potentially 4. Does the patient require coordinated care of a physician, rehab nurse, PT (1-2 hrs/day, 5 days/week), OT (1-2 hrs/day, 5 days/week) and SLP (1-2 hrs/day, 5 days/week) to address physical and functional deficits in the context of the above medical diagnosis(es)? Yes and Potentially Addressing deficits in the following areas: balance, endurance, locomotion, strength, transferring, bowel/bladder control, bathing, dressing, feeding, grooming, toileting, cognition, speech, language and psychosocial support 5. Can the patient actively participate in an intensive therapy program of at least 3 hrs of therapy per day at least 5 days per week? Yes and Potentially 6. The potential for patient to make measurable gains while on inpatient rehab is good 7. Anticipated functional outcomes upon discharge from inpatient rehab are supervision to min assist with PT, supervision to min assist with OT, supervision to min assist with SLP. 8. Estimated rehab length of stay to reach the above functional goals is: 2-3 weeks 9. Does the patient have adequate social supports to accommodate these discharge functional goals?  Potentially 10. Anticipated D/C setting: Home 11. Anticipated post D/C treatments: HH therapy 12. Overall Rehab/Functional Prognosis: good  RECOMMENDATIONS: This patient's condition is appropriate for continued rehabilitative care in the following setting: CIR Patient has agreed to participate in recommended program. Yes and Potentially Note that insurance prior authorization may be required for reimbursement for recommended care.  Comment:Rehab RN to follow up.   Ranelle Oyster, MD, [place] [facility]     06/19/2012

## 2012-06-19 NOTE — Progress Notes (Signed)
Patient ID: DIRK VANAMAN, male   DOB: 1955/11/09, 57 y.o.   MRN: [id number] BP 169/89  Pulse 75  Temp(Src) 97.7 F (36.5 C) (Oral)  Resp 17  Ht 5\' 4"  (1.626 m)  Wt 59 kg (130 lb 1.1 oz)  BMI 22.32 kg/m2  SpO2 96% Alert and following commands Head Ct is unchanged, no midline shift Stable exam.  Rehab consult today

## 2012-06-20 LAB — CULTURE, BLOOD (ROUTINE X 2): Culture: NO GROWTH

## 2012-06-20 LAB — BASIC METABOLIC PANEL
BUN: 18 mg/dL (ref 6–23)
CO2: 25 mEq/L (ref 19–32)
Chloride: 86 mEq/L — ABNORMAL LOW (ref 96–112)
Glucose, Bld: 131 mg/dL — ABNORMAL HIGH (ref 70–99)
Potassium: 3.2 mEq/L — ABNORMAL LOW (ref 3.5–5.1)

## 2012-06-20 NOTE — Progress Notes (Signed)
Patient ID: Luis Oconnor, male   DOB: 1956/01/21, 57 y.o.   MRN: [id number] BP 155/80  Pulse 66  Temp(Src) 98.5 F (36.9 C) (Oral)  Resp 17  Ht 5\' 4"  (1.626 m)  Wt 66.5 kg (146 lb 9.7 oz)  BMI 25.15 kg/m2  SpO2 99% Lethargic easily arousable. Sitting in chair Will follow commands. 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  Placement is the issue.  Transfer to floor.

## 2012-06-21 DIAGNOSIS — I619 Nontraumatic intracerebral hemorrhage, unspecified: Secondary | ICD-10-CM

## 2012-06-21 MED ORDER — SODIUM CHLORIDE 0.9 % IV SOLN
500.0000 mg | Freq: Two times a day (BID) | INTRAVENOUS | Status: DC
  Administered 2012-06-21 – 2012-06-22 (×3): 500 mg via INTRAVENOUS
  Filled 2012-06-21 (×3): qty 5

## 2012-06-21 MED ORDER — OXYCODONE-ACETAMINOPHEN 5-325 MG PO TABS
1.0000 | ORAL_TABLET | ORAL | Status: DC | PRN
  Administered 2012-06-21: 2 via ORAL
  Filled 2012-06-21: qty 1
  Filled 2012-06-21: qty 2

## 2012-06-21 NOTE — Progress Notes (Signed)
Patient complain that pain medication was not doing anything to help him. States pain is a "50." No changes neurologically. Dr. Franky Macho notified. Stated patient could have 1-2 of percocet instead of 1. Will continue to monitor.

## 2012-06-21 NOTE — Progress Notes (Signed)
Patient attempted to get out of bed several times this evening, patient very unsteady on feet and can follow commands, but often does not. RN and patient's wife felt patient was not safe, and patient was moved to room 4n02 so that he could be more closely monitored for safety.

## 2012-06-21 NOTE — Progress Notes (Signed)
Pt quiet, sleeping now,  Family at bedside.

## 2012-06-21 NOTE — Progress Notes (Signed)
Bed alarm sounding. RN noted that patient was trying to stand up on side of the bed. IV had been removed. Will restart.

## 2012-06-21 NOTE — Progress Notes (Signed)
Pt was being assisted to use urinal  Suddenly became unresponsive, drooling, eyes rolled.  Assisted back to bed,  incontinenent of urine,  Pt  Opens eyes with stimulation,  No verbal response.  Vital signs stable. Call placed to Dr. Alphonzo Lemmings.

## 2012-06-21 NOTE — Progress Notes (Signed)
Patient ID: Luis Oconnor, male   DOB: 29-Jul-1955, 57 y.o.   MRN: [id number] BP 178/83  Pulse 67  Temp(Src) 99.8 F (37.7 C) (Oral)  Resp 18  Ht 5\' 4"  (1.626 m)  Wt 66.5 kg (146 lb 9.7 oz)  BMI 25.15 kg/m2  SpO2 97% Alert and following commands Moving all extremities Left homonymous hemianopsia Continue with ot

## 2012-06-22 MED ORDER — LEVETIRACETAM 500 MG PO TABS
500.0000 mg | ORAL_TABLET | Freq: Two times a day (BID) | ORAL | Status: DC
  Administered 2012-06-22 – 2012-06-23 (×2): 500 mg via ORAL
  Filled 2012-06-22 (×3): qty 1

## 2012-06-22 NOTE — Progress Notes (Signed)
Occupational Therapy Treatment Patient Details Name: Luis Oconnor MRN: [id number] DOB: [birth year] Today's Date: 06/22/2012 Time: 3086-5784 OT Time Calculation (min): 27 min  OT Assessment / Plan / Recommendation Comments on Treatment Session Pt progressing well. Pt continues to be a great CIR candidate    Follow Up Recommendations  CIR    Barriers to Discharge       Equipment Recommendations       Recommendations for Other Services Rehab consult  Frequency Min 2X/week   Plan      Precautions / Restrictions Precautions Precautions: Fall Precaution Comments: BP under control today per RN. Restrictions Weight Bearing Restrictions: No   Pertinent Vitals/Pain No c/o pain in session    ADL  Toilet Transfer: Simulated;Maximal assistance (lift and lower assistance) Toilet Transfer Method: Stand pivot Toilet Transfer Equipment:  (to recliner) Transfers/Ambulation Related to ADLs: Pt able to come to EOB with extra time and max encouragment and verbal cues. Pt able to maintain sitting balance EOB (swaying back and forth) with min A in prep for ADL tasks and answering Orientation questions. Pt oriented to situation but not date. Pt able to perform sit to stand from EOB with mod to max A 3x. Pt unable to maintain standing for any length of time due to fatigue and decreased attention., Stand pivot with 3rd sit to stand with max. Pt with difficulty weight shifting feet to step towards chair. ADL Comments: Pt requires increased time to process information and then perform a task. Pt had wife and 2 other family members present. Discussed progress and goals to work towards. Pt reports vision is blurried in all fields (including looking to the right). To the left pt reports vision fades away    OT Diagnosis:    OT Problem List:   OT Treatment Interventions:     OT Goals ADL Goals ADL Goal: Grooming - Progress: Progressing toward goals Miscellaneous OT Goals OT Goal: Miscellaneous Goal #1  - Progress: Progressing toward goals OT Goal: Miscellaneous Goal #2 - Progress: Progressing toward goals  Visit Information  Last OT Received On: 06/17/12 Assistance Needed: +1 (could be +2 if want to ambulate)    Subjective Data  Subjective: I am [place] cause I had a burst in my head from high blood pressure   Prior Functioning       Cognition  Cognition Overall Cognitive Status: Impaired Area of Impairment: Attention;Following commands;Safety/judgement;Awareness of deficits Difficult to assess due to: Level of arousal Arousal/Alertness: Lethargic Orientation Level: Person;Place;Situation Behavior During Session: Restless Current Attention Level: Sustained Attention - Other Comments: with mod A Following Commands: Follows one step commands with increased time;Follows one step commands inconsistently Safety/Judgement: Decreased safety judgement for tasks assessed;Decreased awareness of safety precautions Safety/Judgement - Other Comments: pt lets go of RW, pt leans heavily to left side Awareness of Errors: Assistance required to identify errors made;Assistance required to correct errors made Awareness of Errors - Other Comments: pt tells therapist not to let him fall, but lets go of walker and leans heavily on left side, indicates he is standing upright even when leaning forward and holding on to urinal.  Awareness of Deficits: requires mod A Cognition - Other Comments: Pt does present wtih perceptual deficits, especially with trying to don glasses. Pt with poor spacial awarenss seen with sittting    Mobility  Bed Mobility Bed Mobility: Supine to Sit;Scooting to HOB Supine to Sit: 3: Mod assist Sitting - Scoot to Edge of Bed: 4: Min assist Details for Bed  Mobility Assistance: increased time, delayed response, cue to square hips to side of bed and cues to scoot to edge of bed. Transfers Sit to Stand: 3: Mod assist;With upper extremity assist;From bed Stand to Sit: 3: Mod  assist;With upper extremity assist;To chair/3-in-1 Details for Transfer Assistance: mod assist for stability.  Cues for hand placement, technique and safety.    Exercises      Balance Static Sitting Balance Static Sitting - Balance Support: Feet supported Static Sitting - Level of Assistance: 4: Min assist Static Sitting - Comment/# of Minutes: 5-10 min Static Standing Balance Static Standing - Level of Assistance: 3: Mod assist   End of Session OT - End of Session Activity Tolerance: Patient tolerated treatment well Patient left: in chair;with call bell/phone within reach;with family/visitor present  GO     Roney [facility] 06/22/2012, 2:06 PM

## 2012-06-22 NOTE — Progress Notes (Signed)
Met with patient and his family at bedside to talk about possible CIR admission. Pt unable to participate in discussion. Pt would benefit from CIR and family is in agreement with plan. His partner reports that she can provide 24/7 assistance as needed after discharge. Will determine pt's Medicaid/disability status and discuss with pt's family. Will f/u in AM. For questions, call [phone]

## 2012-06-22 NOTE — Progress Notes (Signed)
Patient ID: Luis Oconnor, male   DOB: 1955-04-25, 57 y.o.   MRN: [id number] Afeb, vss No new neuro changes Wound clean and dry Will get rehab consult. Should be excelelnt candidate as wife is home 24/7

## 2012-06-22 NOTE — Progress Notes (Signed)
Physical Therapy Treatment Patient Details Name: Luis Oconnor MRN: [id number] DOB: 07-03-1955 Today's Date: 06/22/2012 Time: 4098-1191 PT Time Calculation (min): 24 min  PT Assessment / Plan / Recommendation Comments on Treatment Session  Pt had improved BP today per RN & ok'd to proceed with therapy, remained lethargic difficult to arouse this session.  Pt requires increased time to follow instructions.  Pt not safe to use RW at this time - lets go and tries to walk outside of RW.  Recommend using HHA until pt shows increased understanding of proper/safe technique.    Follow Up Recommendations  CIR     Does the patient have the potential to tolerate intense rehabilitation     Barriers to Discharge        Equipment Recommendations  None recommended by PT    Recommendations for Other Services Rehab consult  Frequency Min 4X/week   Plan Discharge plan remains appropriate;Frequency remains appropriate    Precautions / Restrictions Precautions Precautions: Fall;Other (comment) Precaution Comments: BP under control today per RN. Restrictions Weight Bearing Restrictions: No   Pertinent Vitals/Pain No indications of pain or discomfort.     Mobility  Bed Mobility Bed Mobility: Supine to Sit;Scooting to HOB Supine to Sit: 4: Min assist Sitting - Scoot to Edge of Bed: 4: Min assist Details for Bed Mobility Assistance: increased time, delayed response, cue to square hips to side of bed and cues to scoot to edge of bed. Transfers Transfers: Sit to Stand;Stand to Sit Sit to Stand: 3: Mod assist;With upper extremity assist;From bed Stand to Sit: 3: Mod assist;With upper extremity assist;To chair/3-in-1 Details for Transfer Assistance: mod assist for stability.  Cues for hand placement, technique and safety. Ambulation/Gait Ambulation/Gait Assistance: 1: +2 Total assist Ambulation/Gait: Patient Percentage: 50% Ambulation Distance (Feet): 30 Feet Assistive device: Rolling walker;2  person hand held assist Ambulation/Gait Assistance Details: Attempted re-introducing RW to pt however, pt cont's to have signifcant difficulty using safely-- Pt often lets go of RW, tries to walk outside/around RW.  (A) due to left lateral lean and to manage walker.  With Bil HHA, pt leaned heavily to left and needed (A) to stay upright/postural control/balance.  Staggering gait. Gait Pattern: Step-through pattern;Decreased stride length;Lateral trunk lean to left;Narrow base of support Gait velocity: decreased General Gait Details: Pt still requiring increased (A) for ambulation with Bil HHA compared to PT session on 06/18/12 per notes.      Exercises     PT Diagnosis:    PT Problem List:   PT Treatment Interventions:     PT Goals Acute Rehab PT Goals Time For Goal Achievement: 07/01/12 Potential to Achieve Goals: Good Pt will go Supine/Side to Sit: with supervision PT Goal: Supine/Side to Sit - Progress: Progressing toward goal Pt will go Sit to Stand: with supervision PT Goal: Sit to Stand - Progress: Progressing toward goal Pt will go Stand to Sit: with supervision PT Goal: Stand to Sit - Progress: Progressing toward goal Pt will Ambulate: >150 feet;with min assist;with least restrictive assistive device PT Goal: Ambulate - Progress: Not progressing  Visit Information  Last PT Received On: 06/22/12 Assistance Needed: +2    Subjective Data  Subjective: Pt in bed with covers over his head, reports he needs to use the bathroom.   Cognition  Cognition Overall Cognitive Status: Difficult to assess Area of Impairment: Attention;Following commands;Safety/judgement;Awareness of errors Difficult to assess due to: Level of arousal Arousal/Alertness: Lethargic Behavior During Session: Lethargic Current Attention Level: Sustained Following Commands:  Follows one step commands with increased time;Follows one step commands inconsistently Safety/Judgement: Decreased safety judgement for  tasks assessed;Decreased awareness of safety precautions Safety/Judgement - Other Comments: pt lets go of RW, pt leans heavily to left side Awareness of Errors: Assistance required to identify errors made;Assistance required to correct errors made Awareness of Errors - Other Comments: pt tells therapist not to let him fall, but lets go of walker and leans heavily on left side, indicates he is standing upright even when leaning forward and holding on to urinal.  Cognition - Other Comments: Pt continues to be slow to process information.  Pt sometimes spatially unaware - indicates he feet are touching the ground when they are not and that he is standing up straight when he is leaning.    Balance     End of Session PT - End of Session Equipment Utilized During Treatment: Gait belt Activity Tolerance: Patient limited by fatigue Patient left: in chair;with call bell/phone within reach Nurse Communication: Mobility status   GP     Luis Oconnor, SPTA 06/22/2012, 12:06 PM

## 2012-06-23 ENCOUNTER — Inpatient Hospital Stay (HOSPITAL_COMMUNITY): Payer: Medicaid Other

## 2012-06-23 ENCOUNTER — Encounter (HOSPITAL_COMMUNITY): Payer: Self-pay | Admitting: Radiology

## 2012-06-23 ENCOUNTER — Inpatient Hospital Stay (HOSPITAL_COMMUNITY)
Admission: RE | Admit: 2012-06-23 | Discharge: 2012-07-04 | DRG: 945 | Disposition: A | Discharge status: Home / Self Care | Payer: Medicaid Other | Source: Intra-hospital | Attending: Physical Medicine & Rehabilitation | Admitting: Physical Medicine & Rehabilitation

## 2012-06-23 DIAGNOSIS — G3184 Mild cognitive impairment, so stated: Secondary | ICD-10-CM | POA: Diagnosis present

## 2012-06-23 DIAGNOSIS — I10 Essential (primary) hypertension: Secondary | ICD-10-CM

## 2012-06-23 DIAGNOSIS — Z598 Other problems related to housing and economic circumstances: Secondary | ICD-10-CM

## 2012-06-23 DIAGNOSIS — Z806 Family history of leukemia: Secondary | ICD-10-CM

## 2012-06-23 DIAGNOSIS — Z8249 Family history of ischemic heart disease and other diseases of the circulatory system: Secondary | ICD-10-CM

## 2012-06-23 DIAGNOSIS — E876 Hypokalemia: Secondary | ICD-10-CM | POA: Diagnosis present

## 2012-06-23 DIAGNOSIS — G936 Cerebral edema: Secondary | ICD-10-CM | POA: Diagnosis present

## 2012-06-23 DIAGNOSIS — I619 Nontraumatic intracerebral hemorrhage, unspecified: Secondary | ICD-10-CM | POA: Insufficient documentation

## 2012-06-23 DIAGNOSIS — Z5987 Material hardship due to limited financial resources, not elsewhere classified: Secondary | ICD-10-CM

## 2012-06-23 DIAGNOSIS — K5900 Constipation, unspecified: Secondary | ICD-10-CM | POA: Diagnosis present

## 2012-06-23 DIAGNOSIS — Z79899 Other long term (current) drug therapy: Secondary | ICD-10-CM

## 2012-06-23 DIAGNOSIS — R51 Headache: Secondary | ICD-10-CM | POA: Diagnosis present

## 2012-06-23 DIAGNOSIS — R519 Headache, unspecified: Secondary | ICD-10-CM

## 2012-06-23 DIAGNOSIS — Z5189 Encounter for other specified aftercare: Principal | ICD-10-CM

## 2012-06-23 DIAGNOSIS — R42 Dizziness and giddiness: Secondary | ICD-10-CM | POA: Diagnosis present

## 2012-06-23 DIAGNOSIS — R279 Unspecified lack of coordination: Secondary | ICD-10-CM | POA: Diagnosis present

## 2012-06-23 DIAGNOSIS — E871 Hypo-osmolality and hyponatremia: Secondary | ICD-10-CM

## 2012-06-23 MED ORDER — POLYETHYLENE GLYCOL 3350 17 G PO PACK
17.0000 g | PACK | Freq: Every day | ORAL | Status: DC | PRN
  Administered 2012-06-24: 17 g via ORAL
  Filled 2012-06-23: qty 1

## 2012-06-23 MED ORDER — OXYCODONE HCL 5 MG PO TABS
5.0000 mg | ORAL_TABLET | ORAL | Status: DC | PRN
  Administered 2012-06-24: 5 mg via ORAL
  Administered 2012-06-25 (×3): 10 mg via ORAL
  Administered 2012-06-25: 5 mg via ORAL
  Administered 2012-06-25 – 2012-06-27 (×7): 10 mg via ORAL
  Administered 2012-06-28 – 2012-07-03 (×11): 5 mg via ORAL
  Administered 2012-07-03 – 2012-07-04 (×2): 10 mg via ORAL
  Filled 2012-06-23: qty 1
  Filled 2012-06-23: qty 2
  Filled 2012-06-23 (×2): qty 1
  Filled 2012-06-23: qty 2
  Filled 2012-06-23: qty 1
  Filled 2012-06-23: qty 2
  Filled 2012-06-23: qty 1
  Filled 2012-06-23: qty 2
  Filled 2012-06-23 (×2): qty 1
  Filled 2012-06-23 (×7): qty 2
  Filled 2012-06-23 (×4): qty 1
  Filled 2012-06-23 (×2): qty 2
  Filled 2012-06-23 (×2): qty 1
  Filled 2012-06-23: qty 2

## 2012-06-23 MED ORDER — ONDANSETRON HCL 4 MG/2ML IJ SOLN: 4.0000 mg | INTRAMUSCULAR | Status: DC | PRN

## 2012-06-23 MED ORDER — AMLODIPINE BESYLATE 10 MG PO TABS
10.0000 mg | ORAL_TABLET | Freq: Every day | ORAL | Status: DC
  Administered 2012-06-25 – 2012-07-04 (×10): 10 mg via ORAL
  Filled 2012-06-23 (×12): qty 1

## 2012-06-23 MED ORDER — LEVETIRACETAM 500 MG PO TABS
500.0000 mg | ORAL_TABLET | Freq: Two times a day (BID) | ORAL | Status: DC
  Administered 2012-06-23 – 2012-06-30 (×14): 500 mg via ORAL
  Filled 2012-06-23 (×16): qty 1

## 2012-06-23 MED ORDER — TRAZODONE HCL 50 MG PO TABS: 25.0000 mg | ORAL_TABLET | Freq: Every evening | ORAL | Status: DC | PRN

## 2012-06-23 MED ORDER — ENALAPRIL MALEATE 10 MG PO TABS
10.0000 mg | ORAL_TABLET | Freq: Every day | ORAL | Status: DC
  Filled 2012-06-23 (×2): qty 1

## 2012-06-23 MED ORDER — ACETAMINOPHEN 325 MG PO TABS
325.0000 mg | ORAL_TABLET | ORAL | Status: DC | PRN
  Administered 2012-06-24 – 2012-07-02 (×15): 650 mg via ORAL
  Filled 2012-06-23 (×16): qty 2

## 2012-06-23 MED ORDER — BISACODYL 10 MG RE SUPP
10.0000 mg | Freq: Every day | RECTAL | Status: DC | PRN
  Administered 2012-06-24: 10 mg via RECTAL
  Filled 2012-06-23: qty 1

## 2012-06-23 MED ORDER — PNEUMOCOCCAL VAC POLYVALENT 25 MCG/0.5ML IJ INJ
0.5000 mL | INJECTION | INTRAMUSCULAR | Status: AC
  Filled 2012-06-23: qty 0.5

## 2012-06-23 MED ORDER — POLYETHYLENE GLYCOL 3350 17 G PO PACK
17.0000 g | PACK | Freq: Every day | ORAL | Status: DC
  Administered 2012-06-24: 17 g via ORAL
  Filled 2012-06-23 (×2): qty 1

## 2012-06-23 MED ORDER — ONDANSETRON HCL 4 MG PO TABS: 4.0000 mg | ORAL_TABLET | ORAL | Status: DC | PRN

## 2012-06-23 MED ORDER — LABETALOL HCL 200 MG PO TABS
200.0000 mg | ORAL_TABLET | Freq: Two times a day (BID) | ORAL | Status: DC
  Administered 2012-06-23: 200 mg via ORAL
  Filled 2012-06-23 (×4): qty 1

## 2012-06-23 MED ORDER — GUAIFENESIN-DM 100-10 MG/5ML PO SYRP: 5.0000 mL | ORAL_SOLUTION | Freq: Four times a day (QID) | ORAL | Status: DC | PRN

## 2012-06-23 MED ORDER — SPIRONOLACTONE 50 MG PO TABS
50.0000 mg | ORAL_TABLET | Freq: Two times a day (BID) | ORAL | Status: DC
  Filled 2012-06-23 (×3): qty 1

## 2012-06-23 MED ORDER — POTASSIUM CHLORIDE CRYS ER 20 MEQ PO TBCR
20.0000 meq | EXTENDED_RELEASE_TABLET | Freq: Two times a day (BID) | ORAL | Status: AC
  Administered 2012-06-23 – 2012-06-24 (×3): 20 meq via ORAL
  Filled 2012-06-23 (×3): qty 1

## 2012-06-23 MED ORDER — ALUM & MAG HYDROXIDE-SIMETH 200-200-20 MG/5ML PO SUSP: 30.0000 mL | ORAL | Status: DC | PRN

## 2012-06-23 NOTE — Progress Notes (Signed)
Patient received at 1750 alert and oriented x 2-3 . Right scalp incision with staples intact . Bruising noted to left  forearm. Oriented patient to room and call bell system . Bed alarm initiated. Patient reported he wants to wait for girlfriend to come in before signing safety plan with RN. Patient verbalized understanding of safety protocol . Continue with plan of care .                                          Cleotilde Neer

## 2012-06-23 NOTE — Progress Notes (Signed)
Overall Plan of Care [facility]) Patient Details Name: Luis Oconnor MRN: [id number] DOB: 11-Oct-1955  Diagnosis:  Right temporal-parietal IPH  Co-morbidities: htn, hyponatremia, hypokalemia  Functional Problem List  Patient demonstrates impairments in the following areas: Balance, Behavior, Bladder, Bowel, Cognition, Endurance, Linguistic, Medication Management, Motor, Nutrition, Perception and Vision  Basic ADL's: eating, grooming, bathing, dressing and toileting Advanced ADL's: simple meal preparation  Transfers:  bed mobility, bed to chair, toilet, tub/shower, car and furniture Locomotion:  ambulation, wheelchair mobility and stairs  Additional Impairments:  Social Cognition   problem solving, memory, attention and awareness, Leisure Awareness and Discharge Disposition  Anticipated Outcomes Item Anticipated Outcome  Eating/Swallowing    Basic self-care  supervision  Tolieting  supervision  Bowel/Bladder  Mod independent   Transfers  supervision  Locomotion  supervision  Communication    Cognition  Supervision-Min assist   Pain    Safety/Judgment  Supervision-Min A assist   Other     Therapy Plan: PT Intensity: Minimum of 1-2 x/day ,45 to 90 minutes PT Frequency: 5 out of 7 days PT Duration Estimated Length of Stay: 2 weeks OT Intensity: Minimum of 1-2 x/day, 45 to 90 minutes OT Frequency: 5 out of 7 days OT Duration/Estimated Length of Stay: 2 1/2 weeks SLP Intensity: Minumum of 1-2 x/day, 30 to 90 minutes SLP Frequency: 5 out of 7 days SLP Duration/Estimated Length of Stay: 2-3 weeks    Team Interventions: Item RN PT OT SLP SW TR Other  Self Care/Advanced ADL Retraining   x      Neuromuscular Re-Education  x x      Therapeutic Activities  x x x     UE/LE Strength Training/ROM  x x      UE/LE Coordination Activities  x x      Visual/Perceptual Remediation/Compensation   x      DME/Adaptive Equipment Instruction  x x      Therapeutic Exercise  x x       Balance/Vestibular Training x x x      Patient/Family Education x x x x     Cognitive Remediation/Compensation x x x x     Functional Mobility Training x x x      Ambulation/Gait Training x x       Stair Training  x       Wheelchair Propulsion/Positioning  x       Functional Electrical Stimulation  x       Community Reintegration  x x      Dysphagia/[employer] Film/video editor         Bladder Management x        Bowel Management x        Disease Management/Prevention x        Pain Management x x       Medication Management x        Skin Care/Wound Management x  x      Splinting/Orthotics  x x      Discharge Planning  x x x     Psychosocial Support   x x                            Team Discharge Planning: Destination: PT-Home ,OT- Home , SLP-Home Projected Follow-up: PT-Home health PT, OT-  Outpatient OT, SLP-Home Health SLP;24 hour supervision/assistance Projected Equipment Needs: PT- , OT-  , SLP-None recommended  by SLP Patient/family involved in discharge planning: PT- Patient,  OT-Patient;Family member/caregiver, SLP-Patient;Family member/caregiver  MD ELOS: 2.5 weeks Medical Rehab Prognosis:  Excellent Assessment: The patient has been admitted for CIR therapies. The team will be addressing, functional mobility, strength, stamina, balance, safety, adaptive techniques/equipment, self-care, bowel and bladder mgt, patient and caregiver education, NMR, CPT, communication, establishment of sleep-wake cycle, behavior. Goals have been set at supervision to minimal assist.  Ranelle Oyster, MD, [facility] .      See Team Conference Notes for weekly updates to the plan of care

## 2012-06-23 NOTE — Progress Notes (Signed)
12 lead EKG completed and Harvel Ricks, PA called to confirm results. Per PA continue to monitor pt and will compare previous readings to current EKG. Pt in no distress, Will continue to monitor pt

## 2012-06-23 NOTE — PMR Pre-admission (Signed)
PMR Admission Coordinator Pre-Admission Assessment  Patient: Luis Oconnor is an 57 y.o., male MRN: [id number] DOB: 09-Sep-1955 Height: 5\' 4"  (162.6 cm) Weight: 66.5 kg (146 lb 9.7 oz) (pt weighed utilizing bed scale)              Insurance Information PRIMARY: Medicaid pending Medicaid Application Date: 06/22/12     Case Manager:  Disability Application Date: 3/14     Case Worker:   Emergency Contact Information Contact Information   Name Relation Home Work Mobile   Percy Sister   [phone]   Jacquelin Hawking Long-term partner   [phone]     Current Medical History  Patient Admitting Diagnosis: left temporal-parietal ICH.   History of Present Illness: 57 y.o. male with history of HTN-non compliant with medications. Was admitted on 06/14/12 with right sided headache, blurred vision and confusion. CT head done revealing 3.6 x 5.8 x 6.5 cm right temporoparietal intraparenchymal hematoma with mass effect and 4 mm right to left midline shift. Dr. Gerlene Fee was consulted and patient taken to OR emergently for right temporoparietal craniotomy for evacuation of hematoma. Post op with fever and elevated BP. Fever monitored and likely due to ICH. CCM following for management of medical issues/BP management. PT/OT evaluations done and patient limited by visual deficits as well as poor attention, ataxia and balance deficits.  06/23/12 UPDATE: CT of head 06/23/12: Findings: Persistent 2 mm of right to left midline shift. Postoperative changes of right parietal craniotomy. Right temporal parenchymal hematoma with surrounding vasogenic edema shows expected evolutionary changes. Edema in the right parietal lobe appears similar. Improvement in pneumocephalus associated with  decompression. Streak artifact is present in the left frontal  region. Blood along the falx appears similar. There is no  hydrocephalus. Third and fourth ventricles appear decompressed.  IMPRESSION:  Expected evolution of right temporal  hematoma and postoperative  changes of right parietal craniotomy. Unchanged 2 mm right to left  midline shift.  Total: 6=NIH   Past Medical History  Past Medical History  Diagnosis Date  . Hypertension    Family History  family history is not on file.  Prior Rehab/Hospitalizations: none   Current Medications  Current facility-administered medications:acetaminophen (TYLENOL) tablet 650 mg, 650 mg, Oral, Q4H PRN, Merwyn Katos, MD;  amLODipine (NORVASC) tablet 10 mg, 10 mg, Oral, Daily, Merwyn Katos, MD, 10 mg at 06/23/12 1002;  enalapril (VASOTEC) tablet 10 mg, 10 mg, Oral, Daily, Merwyn Katos, MD, 10 mg at 06/23/12 1004;  hydrALAZINE (APRESOLINE) injection 10-40 mg, 10-40 mg, Intravenous, Q4H PRN, Merwyn Katos, MD, 20 mg at 06/21/12 7846 hydrochlorothiazide (HYDRODIURIL) tablet 25 mg, 25 mg, Oral, Daily, [name], MD, 25 mg at 06/23/12 1004;  HYDROcodone-acetaminophen (NORCO/VICODIN) 5-325 MG per tablet 1 tablet, 1 tablet, Oral, Q4H PRN, [name], MD, 1 tablet at 06/21/12 1238;  labetalol (NORMODYNE) tablet 200 mg, 200 mg, Oral, BID, Merwyn Katos, MD, 200 mg at 06/23/12 1006 levETIRAcetam (KEPPRA) tablet 500 mg, 500 mg, Oral, BID, [name], MD, 500 mg at 06/23/12 1006;  ondansetron [facility]) injection 4 mg, 4 mg, Intravenous, Q4H PRN, [name], MD, 4 mg at 06/20/12 2336;  ondansetron (ZOFRAN) tablet 4 mg, 4 mg, Oral, Q4H PRN, [name], MD;  oxyCODONE-acetaminophen (PERCOCET/ROXICET) 5-325 MG per tablet 1-2 tablet, 1-2 tablet, Oral, Q4H PRN, Carmela Hurt, MD, 2 tablet at 06/21/12 9629 promethazine (PHENERGAN) tablet 12.5-25 mg, 12.5-25 mg, Oral, Q4H PRN, [name], MD;  spironolactone (  ALDACTONE) tablet 50 mg, 50 mg, Oral, BID, Merwyn Katos, MD, 50 mg at 06/23/12 1005  Patients Current Diet: Carb Control  Precautions / Restrictions Precautions Precautions: Fall Precaution Comments: BP under control today per  RN. 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)  Current Functional Level Cognition  Arousal/Alertness: Lethargic Overall Cognitive Status: Impaired Overall Cognitive Status: Impaired Difficult to assess due to: Level of arousal Current Attention Level: Sustained Attention - Other Comments: with mod A Orientation Level: Oriented to person;Disoriented to time;Disoriented to situation;Oriented to place Following Commands: Follows one step commands with increased time;Follows one step commands inconsistently Safety/Judgement: Decreased safety judgement for tasks assessed;Decreased awareness of safety precautions Safety/Judgement - Other Comments: pt lets go of RW, pt leans heavily to left side Awareness of Errors: Assistance required to identify errors made;Assistance required to correct errors made Awareness of Errors - Other Comments: pt tells therapist not to let him fall, but lets go of walker and leans heavily on left side, indicates he is standing upright even when leaning forward and holding on to urinal.  Awareness of Deficits: requires mod A Cognition - Other Comments: Pt does present wtih perceptual deficits, especially with trying to don glasses. Pt with poor spacial awarenss seen with sittting Attention: Focused;Sustained Focused Attention: Impaired Focused Attention Impairment: Verbal basic Sustained Attention: Impaired Sustained Attention Impairment: Verbal basic Awareness: Impaired Awareness Impairment: Emergent impairment Problem Solving: Impaired Problem Solving Impairment: Functional basic Executive Function: Reasoning;Decision Making;Self Monitoring;Self  Correcting Reasoning: Impaired Reasoning Impairment: Functional basic;Verbal basic Decision Making: Impaired Decision Making Impairment: Functional basic Self Monitoring: Impaired Self Monitoring Impairment: Functional basic Self Correcting: Impaired Self Correcting Impairment: Functional basic Behaviors: Impulsive;Restless;Perseveration Safety/Judgment: Impaired    Extremity Assessment (includes Sensation/Coordination)  RUE ROM/Strength/Tone: Deficits;Due to impaired cognition  RLE ROM/Strength/Tone: Deficits RLE ROM/Strength/Tone Deficits: grossly 4/5 per functional assessment, however pain in head is overpowering his ability to move.      ADLs  Toilet Transfer: Simulated;Maximal assistance (lift and lower assistance) Toilet Transfer Method: Stand pivot Toilet Transfer Equipment:  (to recliner) Transfers/Ambulation Related to ADLs: Pt able to come to EOB with extra time and max encouragment and verbal cues. Pt able to maintain sitting balance EOB (swaying back and forth) with min A in prep for ADL tasks and answering Orientation questions. Pt oriented to situation but not date. Pt able to perform sit to stand from EOB with mod to max A 3x. Pt unable to maintain standing for any length of time due to fatigue and decreased attention., Stand pivot with 3rd sit to stand with max. Pt with difficulty weight shifting feet to step towards chair. ADL Comments: Pt requires increased time to process information and then perform a task. Pt had wife and 2 other family members present. Discussed progress and goals to work towards. Pt reports vision is blurried in all fields (including looking to the right). To the left pt reports vision fades away    Mobility  Bed Mobility: Supine to Sit;Scooting to HOB Supine to Sit: 3: Mod assist Sitting - Scoot to Edge of Bed: 4: Min assist Sit to Supine: 4: Min assist    Transfers  Transfers: Sit to Stand;Stand to Sit Sit to Stand: 3: Mod assist;With upper  extremity assist;From bed Stand  to Sit: 3: Mod assist;With upper extremity assist;To chair/3-in-1 Stand Pivot Transfers: 3: Mod assist    Ambulation / Gait / Stairs / Wheelchair Mobility  Ambulation/Gait Ambulation/Gait Assistance: 1: +2 Total assist Ambulation/Gait: Patient Percentage: 50% Ambulation Distance (Feet): 30 Feet Assistive device: Rolling walker;2 person hand held assist Ambulation/Gait Assistance Details: Attempted re-introducing RW to pt however, pt cont's to have signifcant difficulty using safely-- Pt often lets go of RW, tries to walk outside/around RW.  (A) due to left lateral lean and to manage walker.  With Bil HHA, pt leaned heavily to left and needed (A) to stay upright/postural control/balance.  Staggering gait. Gait Pattern: Step-through pattern;Decreased stride length;Lateral trunk lean to left;Narrow base of support Gait velocity: decreased General Gait Details: Pt still requiring increased (A) for ambulation with Bil HHA compared to PT session on 06/18/12 per notes.      Posture / Balance Static Sitting Balance Static Sitting - Balance Support: Feet supported Static Sitting - Level of Assistance: 4: Min assist Static Sitting - Comment/# of Minutes: 5-10 min Static Standing Balance Static Standing - Balance Support: Right upper extremity supported Static Standing - Level of Assistance: 3: Mod assist    Special needs/care consideration  Skin: (R)Crani incision staples intact          Bowel mgmt: LBM unknown Bladder mgmt: incontinent at times Safety: patient is impulsive    Previous Home Environment Living Arrangements: Spouse/significant other (girlfriend) Lives With: Jacquelin Hawking Available Help at Discharge: Family;Available 24 hours/day Type of Home: Apartment Home Layout: One level Home Access: Level entry Bathroom Shower/Tub: Engineer, manufacturing systems: Standard Bathroom Accessibility: Yes How Accessible: Accessible via walker Home Care  Services: No Additional Comments: difficult to assess due to pain is consuming his thoughts.  He is slow to move and slow to answer any questions during treatment session.   Discharge Living Setting Plans for Discharge Living Setting: Patient's home;Lives with long-term girlfriend Type of Home at Discharge: Apartment Discharge Home Layout: One level Discharge Home Access: Level entry Discharge Bathroom Shower/Tub: Tub/shower unit Discharge Bathroom Toilet: Standard Discharge Bathroom Accessibility: Yes How Accessible: Accessible via walker Do you have any problems obtaining your medications?: Yes -- Medicaid pending  Social/Family/Support Systems Patient Roles: Partner, brother Contact Information: [phone] Anticipated Caregiver: Jacquelin Hawking Anticipated Caregiver's Contact Information: [phone] Ability/Limitations of Caregiver: none Caregiver Availability: 24/7 Discharge Plan Discussed with Primary Caregiver: Yes Is Caregiver In Agreement with Plan?: Yes Does Caregiver/Family have Issues with Lodging/Transportation while Pt is in Rehab?: No  Goals/Additional Needs Patient/Family Goal for Rehab: Min Assist - supervision Expected length of stay: 2-3 weeks Cultural Considerations: none Dietary Needs: Carb modified Equipment Needs: TBD Pt/Family Agrees to Admission and willing to participate: Yes Program Orientation Provided & Reviewed with Pt/Caregiver Including Roles  & Responsibilities: Yes  Decrease burden of Care through IP rehab admission: Decrease number of caregivers, Bowel and bladder program and Patient/family education  Possible need for SNF placement upon discharge:No  Patient Condition: This patient's medical status has not changed since the consult dated: 06/19/12 in which the rehab physician determined that the patient's condition is appropriate for inpatient rehabilitation. See "History of Present Illness" above for medical update. Functionally, patient's  participation has improved, ambulating 30' with total assist. Patient's medical and functional status update has been discussed with the rehab physician, who has determined that pt is still appropriate for CIR. Will admit to inpatient rehab today.  Preadmission Screen Completed By:  Meryl Dare, 06/23/2012 11:04 AM ______________________________________________________________________  Discussed status with Dr. Riley Kill on 06/23/12 at 11:20 AM and received telephone approval for admission today.  Admission Coordinator:  Meryl Dare, time 11:20 AM / Date 06/23/12

## 2012-06-23 NOTE — H&P (Signed)
Physical Medicine and Rehabilitation Admission H&P    Chief Complaint  Patient presents with  . Headache  : HPI: Luis Oconnor is a 57 y.o. male with history of HTN-no medications due to financial constraints. He was admitted on 06/14/12 with right sided headache, blurred vision and confusion. CT head done revealing 3.6 x 5.8 x 6.5 cm right temporoparietal intraparenchymal hematoma with mass effect and 4 mm right to left midline shift. Dr. Kritzer was consulted and patient taken to OR emergently for right temporoparietal craniotomy for evacuation of hematoma. Post op with fever and elevated BP. Fever being monitored off antibiotics as felt to be due to ICH. Follow up CCT with improvement in pneumocephalus, no hydo- cephalus and no change in edema.  PT/OT evaluations done and patient limited by visual deficits as well as poor attention, ataxia and balance deficits. He continues to have bouts of lethargy with complaints of severe HA. Therapy team/MD recommending CIR and patient admitted today for progressive therapies.    Review of Systems  Eyes: Positive for photophobia.  Respiratory: Negative for shortness of breath.   Cardiovascular: Negative for chest pain and palpitations.  Genitourinary: Positive for frequency.  Neurological: Positive for dizziness and headaches.   Past Medical History  Diagnosis Date  . Hypertension   . Ulcer    Past Surgical History  Procedure Laterality Date  . Craniotomy Right 06/14/2012    Procedure: CRANIOTOMY HEMATOMA EVACUATION SUBDURAL;  Surgeon: Randy O Kritzer, MD;  Location: [facility];  Service: Neurosurgery;  Laterality: Right;   Family History  Problem Relation Age of Onset  . Hypertension Mother   . Hypertension Father   . Cancer Brother     leukemia  . Renal Disease Brother     Social History:  Lives with significant other (been together 18  Years). Works part time as a cook at [employer]. He reports that he has never smoked. He does not  have any smokeless tobacco history on file. He reports that  drinks beer occassionally.  He reports that he does not use illicit drugs.  Allergies  Allergen Reactions  . Clonidine Derivatives Nausea Only    Scheduled Meds: . amLODipine  10 mg Oral Daily  . enalapril  10 mg Oral Daily  . hydrochlorothiazide  25 mg Oral Daily  . labetalol  200 mg Oral BID  . levETIRAcetam  500 mg Oral BID  . spironolactone  50 mg Oral BID    Medications Prior to Admission  Medication Sig Dispense Refill  . hydrochlorothiazide (HYDRODIURIL) 25 MG tablet Take 1 tablet (25 mg total) by mouth daily.  30 tablet  0  . metoprolol (LOPRESSOR) 100 MG tablet Take 0.5 tablets (50 mg total) by mouth 2 (two) times daily.  30 tablet  0  . oxymetazoline (AFRIN) 0.05 % nasal spray Place 2 sprays into the nose 2 (two) times daily.        Home: Home Living Lives With: Other (Comment) (unknown at this time) Available Help at Discharge: Family;Available 24 hours/day Type of Home: Apartment Home Access: Level entry Home Layout: One level Bathroom Shower/Tub: Tub/shower unit Bathroom Toilet: Standard Bathroom Accessibility: Yes How Accessible: Accessible via walker Additional Comments: difficult to assess due to pain is consuming his thoughts.  He is slow to move and slow to answer any questions during treatment session.    Functional History: Prior Function Able to Take Stairs?: Yes Driving: No (Rode a bike.) Vocation: Part time employment (restaurant work)  Functional Status:    Mobility: Bed Mobility Bed Mobility: Supine to Sit;Sitting - Scoot to Edge of Bed Supine to Sit: 4: Min assist Sitting - Scoot to Edge of Bed: 4: Min assist Sit to Supine: 4: Min assist Transfers Transfers: Sit to Stand;Stand to Sit Sit to Stand: 4: Min assist;With upper extremity assist;From bed Stand to Sit: 4: Min assist;With upper extremity assist;To bed Stand Pivot Transfers: 3: Mod assist Ambulation/Gait Ambulation/Gait  Assistance: 1: +2 Total assist Ambulation/Gait: Patient Percentage: 60% Ambulation Distance (Feet): 60 Feet Assistive device: 2 person hand held assist Ambulation/Gait Assistance Details: 2 person HHA for balance and stability.  Cues to look up.  Gait Pattern: Step-through pattern;Scissoring;Narrow base of support Gait velocity: decreased General Gait Details: Pt requires increased (A) for ambulation due to unsteadiness and LOB, especially to left.    ADL: ADL Toilet Transfer: Simulated;Maximal assistance (lift and lower assistance) Toilet Transfer Method: Stand pivot Toilet Transfer Equipment:  (to recliner) Transfers/Ambulation Related to ADLs: Pt able to come to EOB with extra time and max encouragment and verbal cues. Pt able to maintain sitting balance EOB (swaying back and forth) with min A in prep for ADL tasks and answering Orientation questions. Pt oriented to situation but not date. Pt able to perform sit to stand from EOB with mod to max A 3x. Pt unable to maintain standing for any length of time due to fatigue and decreased attention., Stand pivot with 3rd sit to stand with max. Pt with difficulty weight shifting feet to step towards chair. ADL Comments: Pt requires increased time to process information and then perform a task. Pt had wife and 2 other family members present. Discussed progress and goals to work towards. Pt reports vision is blurried in all fields (including looking to the right). To the left pt reports vision fades away  Cognition: Cognition Overall Cognitive Status: Impaired Arousal/Alertness: Lethargic Orientation Level: Oriented to person;Disoriented to time;Disoriented to situation;Oriented to place Attention: Focused;Sustained Focused Attention: Impaired Focused Attention Impairment: Verbal basic Sustained Attention: Impaired Sustained Attention Impairment: Verbal basic Awareness: Impaired Awareness Impairment: Emergent impairment Problem Solving:  Impaired Problem Solving Impairment: Functional basic Executive Function: Reasoning;Decision Making;Self Monitoring;Self Correcting Reasoning: Impaired Reasoning Impairment: Functional basic;Verbal basic Decision Making: Impaired Decision Making Impairment: Functional basic Self Monitoring: Impaired Self Monitoring Impairment: Functional basic Self Correcting: Impaired Self Correcting Impairment: Functional basic Behaviors: Impulsive;Restless;Perseveration Safety/Judgment: Impaired Cognition Overall Cognitive Status: Impaired Area of Impairment: Attention;Following commands;Safety/judgement;Awareness of deficits Difficult to assess due to: Level of arousal Arousal/Alertness: Lethargic Orientation Level: Person;Place;Situation Behavior During Session: Lethargic Current Attention Level: Sustained Attention - Other Comments: Pt keeps closing his eyes.  Needs lots of encouragement to participate in therapy. Following Commands: Follows one step commands inconsistently;Follows one step commands with increased time Safety/Judgement: Decreased awareness of safety precautions;Decreased safety judgement for tasks assessed Safety/Judgement - Other Comments: pt lets go of RW, pt leans heavily to left side Awareness of Errors: Assistance required to identify errors made;Assistance required to correct errors made Awareness of Errors - Other Comments: Pt concerned about falling but has difficulty maintaining focus on task to correct errors. Awareness of Deficits: requires mod A Cognition - Other Comments: Pt has difficulty staying focused on task.  Looks down and closes his eyes often.  Physical Exam: Blood pressure 109/71, pulse 63, temperature 98.8 F (37.1 C), temperature source Oral, resp. rate 18, height 5' 4" (1.626 m), weight 66.5 kg (146 lb 9.7 oz), SpO2 99.00%. Physical Exam  Constitutional: He appears well-nourished. He appears lethargic.  Keeps eyes closed   and holding his head.    HENT:  Head: Normocephalic and atraumatic.  Right Ear: External ear normal.  Left Ear: External ear normal.  Nose: Nose normal.  Mouth/Throat: Oropharynx is clear and moist.  Right crani incision with staples in place.   Eyes: Conjunctivae are normal. Pupils are equal, round, and reactive to light. Right eye exhibits no discharge. Left eye exhibits discharge.  Neck: Normal range of motion. No JVD present. No tracheal deviation present. No thyromegaly present.  Cardiovascular: Normal rate and regular rhythm.  Exam reveals no gallop and no friction rub.   No murmur heard. Pulmonary/Chest: Effort normal. No respiratory distress. He has no wheezes. He has no rales. He exhibits no tenderness.  Abdominal: Soft. Bowel sounds are normal. He exhibits no distension. There is no tenderness. There is no rebound.  Musculoskeletal: He exhibits no edema.  Lymphadenopathy:    He has cervical adenopathy.  Neurological: He appears lethargic.  Internally distracted and complaints of dizziness. Speech clear. Needs redirection to answer. Able to follow simple commands. Oriented to month, year, DOB but perseverated on "27" as his age. Moves all 4 but inconsistently participates in MMT. Sensation grossly intact to basicPP and LT.    Psychiatric:  Lethargic, limited attention, sometimes irritable.     No results found for this or any previous visit (from the past 48 hour(s)). Ct Head Wo Contrast  06/23/2012  *RADIOLOGY REPORT*  Clinical Data: Follow-up.  No significant status changes recently.  CT HEAD WITHOUT CONTRAST  Technique:  Contiguous axial images were obtained from the base of the skull through the vertex without contrast.  Comparison: 06/19/2012.  Findings: Persistent 2 mm of right to left midline shift. Postoperative changes of right parietal craniotomy.  Right temporal parenchymal hematoma with surrounding vasogenic edema shows expected evolutionary changes.  Edema in the right parietal lobe appears  similar.  Improvement in pneumocephalus associated with decompression.  Streak artifact is present in the left frontal region.  Blood along the falx appears similar.  There is no hydrocephalus.  Third and fourth ventricles appear decompressed.  IMPRESSION: Expected evolution of right temporal hematoma and postoperative changes of right parietal craniotomy.  Unchanged 2 mm right to left midline shift.   Original Report Authenticated By: Geoffrey Lamke, M.D.     Post Admission Physician Evaluation: 1. Functional deficits secondary  to right temporal-parietal IPH. 2. Patient is admitted to receive collaborative, interdisciplinary care between the physiatrist, rehab nursing staff, and therapy team. 3. Patient's level of medical complexity and substantial therapy needs in context of that medical necessity cannot be provided at a lesser intensity of care such as a SNF. 4. Patient has experienced substantial functional loss from his/her baseline which was documented above under the "Functional History" and "Functional Status" headings.  Judging by the patient's diagnosis, physical exam, and functional history, the patient has potential for functional progress which will result in measurable gains while on inpatient rehab.  These gains will be of substantial and practical use upon discharge  in facilitating mobility and self-care at the household level. 5. Physiatrist will provide 24 hour management of medical needs as well as oversight of the therapy plan/treatment and provide guidance as appropriate regarding the interaction of the two. 6. 24 hour rehab nursing will assist with bladder management, bowel management, safety, skin/wound care, disease management, medication administration, pain management and patient education  and help integrate therapy concepts, techniques,education, etc. 7. PT will assess and treat for/with: Lower extremity strength, range of motion, stamina,   balance, functional mobility, safety,  adaptive techniques and equipment, NMR, pain mgt, safety, cognitive perceptual awareness..   Goals are: supervision to minimal assist. 8. OT will assess and treat for/with: ADL's, functional mobility, safety, upper extremity strength, adaptive techniques and equipment, ADL's, functional mobility, safety, upper extremity strength, adaptive techniques and equipment, NMR, cognitive perceptual training, pain   Goals are: min assist. 9. SLP will assess and treat for/with: speech, communication, cognition.  Goals are: supervision to minimal assist. 10. Case Management and Social Worker will assess and treat for psychological issues and discharge planning. 11. Team conference will be held weekly to assess progress toward goals and to determine barriers to discharge. 12. Patient will receive at least 3 hours of therapy per day at least 5 days per week. 13. ELOS: 2-3 weeks      Prognosis:  good   Medical Problem List and Plan: 1. DVT Prophylaxis/Anticoagulation: Mechanical: Sequential compression devices, below knee Bilateral lower extremities 2. Pain Management:  May need long acting medication to help with symptoms. However, want to limit neurosedating meds as possible. 3. Mood:  Patient cognitively impaired due to ICH. Will monitor as mentation improves. Will have LCSW follow along for input/evaluation as appropriate. 4. Neuropsych: This patient is not capable of making decisions on his/her own behalf. 5. 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 BP now down to 110 range. Continue Vasotec, norvasc, Labetalol and aldactone--will discontinue HCTZ.  Need to monitor HR on labetalol--has been in 50's range in last 24 hours.  6. Hyponatremia: ?Cerebral salt wasting due to BI compounded by diuretics. Will discontinue HCTZ  7. Hypokalemia: supplement and recheck in am.  8. Seizure prophylaxis: continue keppra bid.  9. Dizziness: Will check orthostatic BP. May be symptomatic due to significant drop in  BP. Will set parameters on BP medicaitons.  10. Constipation : will set bowel program.   Tina Temme T. Tulio Facundo, MD, FAAPMR  06/23/2012 

## 2012-06-23 NOTE — Progress Notes (Signed)
Admitting patient to CIR today. Dr Gerlene Fee reports pt is ready to d/c to CIR and patient is in agreement with plan. For questions call [phone]

## 2012-06-23 NOTE — Progress Notes (Signed)
Lavinia Mcneely, PTA [phone] 06/23/2012  

## 2012-06-23 NOTE — H&P (View-Only) (Signed)
Physical Medicine and Rehabilitation Admission H&P    Chief Complaint  Patient presents with  . Headache  : HPI: Luis Oconnor is a 57 y.o. male with history of HTN-no medications due to financial constraints. He was admitted on 06/14/12 with right sided headache, blurred vision and confusion. CT head done revealing 3.6 x 5.8 x 6.5 cm right temporoparietal intraparenchymal hematoma with mass effect and 4 mm right to left midline shift. Dr. Gerlene Fee was consulted and patient taken to OR emergently for right temporoparietal craniotomy for evacuation of hematoma. Post op with fever and elevated BP. Fever being monitored off antibiotics as felt to be due to ICH. Follow up CCT with improvement in pneumocephalus, no hydo- cephalus and no change in edema.  PT/OT evaluations done and patient limited by visual deficits as well as poor attention, ataxia and balance deficits. He continues to have bouts of lethargy with complaints of severe HA. Therapy team/MD recommending CIR and patient admitted today for progressive therapies.    Review of Systems  Eyes: Positive for photophobia.  Respiratory: Negative for shortness of breath.   Cardiovascular: Negative for chest pain and palpitations.  Genitourinary: Positive for frequency.  Neurological: Positive for dizziness and headaches.   Past Medical History  Diagnosis Date  . Hypertension   . Ulcer    Past Surgical History  Procedure Laterality Date  . Craniotomy Right 06/14/2012    Procedure: CRANIOTOMY HEMATOMA EVACUATION SUBDURAL;  Surgeon: [name], MD;  Location: [facility];  Service: Neurosurgery;  Laterality: Right;   Family History  Problem Relation Age of Onset  . Hypertension Mother   . Hypertension Father   . Cancer Brother     leukemia  . Renal Disease Brother     Social History:  Lives with significant other (been together 18  Years). 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 He reports that he has never smoked. He does not  have any smokeless tobacco history on file. He reports that  drinks beer occassionally.  He reports that he does not use illicit drugs.  Allergies  Allergen Reactions  . Clonidine Derivatives Nausea Only    Scheduled Meds: . amLODipine  10 mg Oral Daily  . enalapril  10 mg Oral Daily  . hydrochlorothiazide  25 mg Oral Daily  . labetalol  200 mg Oral BID  . levETIRAcetam  500 mg Oral BID  . spironolactone  50 mg Oral BID    Medications Prior to Admission  Medication Sig Dispense Refill  . hydrochlorothiazide (HYDRODIURIL) 25 MG tablet Take 1 tablet (25 mg total) by mouth daily.  30 tablet  0  . metoprolol (LOPRESSOR) 100 MG tablet Take 0.5 tablets (50 mg total) by mouth 2 (two) times daily.  30 tablet  0  . oxymetazoline (AFRIN) 0.05 % nasal spray Place 2 sprays into the nose 2 (two) times daily.        Home: Home Living Lives With: Other (Comment) (unknown at this time) Available Help at Discharge: Family;Available 24 hours/day Type of Home: Apartment Home Access: Level entry Home Layout: One level Bathroom Shower/Tub: Engineer, manufacturing systems: Standard Bathroom Accessibility: Yes How Accessible: Accessible via walker Additional Comments: difficult to assess due to pain is consuming his thoughts.  He is slow to move and slow to answer any questions during treatment session.    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)  Functional Status:  Mobility: Bed Mobility Bed Mobility: Supine to Sit;Sitting - Scoot to Edge of Bed Supine to Sit: 4: Min assist Sitting - Scoot to [place] of Bed: 4: Min assist Sit to Supine: 4: Min assist Transfers Transfers: Sit to Stand;Stand to Sit Sit to Stand: 4: Min assist;With upper extremity assist;From bed Stand to Sit: 4: Min assist;With upper extremity assist;To bed Stand Pivot Transfers: 3: Mod assist Ambulation/Gait Ambulation/Gait  Assistance: 1: +2 Total assist Ambulation/Gait: Patient Percentage: 60% Ambulation Distance (Feet): 60 Feet Assistive device: 2 person hand held assist Ambulation/Gait Assistance Details: 2 person HHA for balance and stability.  Cues to look up.  Gait Pattern: Step-through pattern;Scissoring;Narrow base of support Gait velocity: decreased General Gait Details: Pt requires increased (A) for ambulation due to unsteadiness and LOB, especially to left.    ADL: ADL Toilet Transfer: Simulated;Maximal assistance (lift and lower assistance) Toilet Transfer Method: Stand pivot Toilet Transfer Equipment:  (to recliner) Transfers/Ambulation Related to ADLs: Pt able to come to EOB with extra time and max encouragment and verbal cues. Pt able to maintain sitting balance EOB (swaying back and forth) with min A in prep for ADL tasks and answering Orientation questions. Pt oriented to situation but not date. Pt able to perform sit to stand from EOB with mod to max A 3x. Pt unable to maintain standing for any length of time due to fatigue and decreased attention., Stand pivot with 3rd sit to stand with max. Pt with difficulty weight shifting feet to step towards chair. ADL Comments: Pt requires increased time to process information and then perform a task. Pt had wife and 2 other family members present. Discussed progress and goals to work towards. Pt reports vision is blurried in all fields (including looking to the right). To the left pt reports vision fades away  Cognition: Cognition Overall Cognitive Status: Impaired Arousal/Alertness: Lethargic Orientation Level: Oriented to person;Disoriented to time;Disoriented to situation;Oriented to place Attention: Focused;Sustained Focused Attention: Impaired Focused Attention Impairment: Verbal basic Sustained Attention: Impaired Sustained Attention Impairment: Verbal basic Awareness: Impaired Awareness Impairment: Emergent impairment Problem Solving:  Impaired Problem Solving Impairment: Functional basic Executive Function: Reasoning;Decision Making;Self Monitoring;Self Correcting Reasoning: Impaired Reasoning Impairment: Functional basic;Verbal basic Decision Making: Impaired Decision Making Impairment: Functional basic Self Monitoring: Impaired Self Monitoring Impairment: Functional basic Self Correcting: Impaired Self Correcting Impairment: Functional basic Behaviors: Impulsive;Restless;Perseveration Safety/Judgment: Impaired Cognition Overall Cognitive Status: Impaired Area of Impairment: Attention;Following commands;Safety/judgement;Awareness of deficits Difficult to assess due to: Level of arousal Arousal/Alertness: Lethargic Orientation Level: Person;Place;Situation Behavior During Session: Lethargic Current Attention Level: Sustained Attention - Other Comments: Pt keeps closing his eyes.  Needs lots of encouragement to participate in therapy. Following Commands: Follows one step commands inconsistently;Follows one step commands with increased time Safety/Judgement: Decreased awareness of safety precautions;Decreased safety judgement for tasks assessed Safety/Judgement - Other Comments: pt lets go of RW, pt leans heavily to left side Awareness of Errors: Assistance required to identify errors made;Assistance required to correct errors made Awareness of Errors - Other Comments: Pt concerned about falling but has difficulty maintaining focus on task to correct errors. Awareness of Deficits: requires mod A Cognition - Other Comments: Pt has difficulty staying focused on task.  Looks down and closes his eyes often.  Physical Exam: Blood pressure 109/71, pulse 63, temperature 98.8 F (37.1 C), temperature source Oral, resp. rate 18, height 5\' 4"  (1.626 m), weight 66.5 kg (146 lb 9.7 oz), SpO2 99.00%. Physical Exam  Constitutional: He appears well-nourished. He appears lethargic.  Keeps eyes closed  and holding his head.    HENT:  Head: Normocephalic and atraumatic.  Right Ear: External ear normal.  Left Ear: External ear normal.  Nose: Nose normal.  Mouth/Throat: Oropharynx is clear and moist.  Right crani incision with staples in place.   Eyes: Conjunctivae are normal. Pupils are equal, round, and reactive to light. Right eye exhibits no discharge. Left eye exhibits discharge.  Neck: Normal range of motion. No JVD present. No tracheal deviation present. No thyromegaly present.  Cardiovascular: Normal rate and regular rhythm.  Exam reveals no gallop and no friction rub.   No murmur heard. Pulmonary/Chest: Effort normal. No respiratory distress. He has no wheezes. He has no rales. He exhibits no tenderness.  Abdominal: Soft. Bowel sounds are normal. He exhibits no distension. There is no tenderness. There is no rebound.  Musculoskeletal: He exhibits no edema.  Lymphadenopathy:    He has cervical adenopathy.  Neurological: He appears lethargic.  Internally distracted and complaints of dizziness. Speech clear. Needs redirection to answer. Able to follow simple commands. Oriented to month, year, DOB but perseverated on "46" as his age. Moves all 4 but inconsistently participates in MMT. Sensation grossly intact to basicPP and LT.    Psychiatric:  Lethargic, limited attention, sometimes irritable.     No results found for this or any previous visit (from the past 48 hour(s)). Ct Head Wo Contrast  06/23/2012  *RADIOLOGY REPORT*  Clinical Data: Follow-up.  No significant status changes recently.  CT HEAD WITHOUT CONTRAST  Technique:  Contiguous axial images were obtained from the base of the skull through the vertex without contrast.  Comparison: 06/19/2012.  Findings: Persistent 2 mm of right to left midline shift. Postoperative changes of right parietal craniotomy.  Right temporal parenchymal hematoma with surrounding vasogenic edema shows expected evolutionary changes.  Edema in the right parietal lobe appears  similar.  Improvement in pneumocephalus associated with decompression.  Streak artifact is present in the left frontal region.  Blood along the falx appears similar.  There is no hydrocephalus.  Third and fourth ventricles appear decompressed.  IMPRESSION: Expected evolution of right temporal hematoma and postoperative changes of right parietal craniotomy.  Unchanged 2 mm right to left midline shift.   Original Report Authenticated By: Andreas Newport, M.D.     Post Admission Physician Evaluation: 1. Functional deficits secondary  to right temporal-parietal IPH. 2. Patient is admitted to receive collaborative, interdisciplinary care between the physiatrist, rehab nursing staff, and therapy team. 3. Patient's level of medical complexity and substantial therapy needs in context of that medical necessity cannot be provided at a lesser intensity of care such as a SNF. 4. Patient has experienced substantial functional loss from his/her baseline which was documented above under the "Functional History" and "Functional Status" headings.  Judging by the patient's diagnosis, physical exam, and functional history, the patient has potential for functional progress which will result in measurable gains while on inpatient rehab.  These gains will be of substantial and practical use upon discharge  in facilitating mobility and self-care at the household level. 5. Physiatrist will provide 24 hour management of medical needs as well as oversight of the therapy plan/treatment and provide guidance as appropriate regarding the interaction of the two. 6. 24 hour rehab nursing will assist with bladder management, bowel management, safety, skin/wound care, disease management, medication administration, pain management and patient education  and help integrate therapy concepts, techniques,education, etc. 7. PT will assess and treat for/with: Lower extremity strength, range of motion, stamina,  balance, functional mobility, safety,  adaptive techniques and equipment, NMR, pain mgt, safety, cognitive perceptual awareness..   Goals are: supervision to minimal assist. 8. OT will assess and treat for/with: ADL's, functional mobility, safety, upper extremity strength, adaptive techniques and equipment, ADL's, functional mobility, safety, upper extremity strength, adaptive techniques and equipment, NMR, cognitive perceptual training, pain   Goals are: min assist. 9. SLP will assess and treat for/with: speech, communication, cognition.  Goals are: supervision to minimal assist. 10. Case Management and Social Worker will assess and treat for psychological issues and discharge planning. 11. Team conference will be held weekly to assess progress toward goals and to determine barriers to discharge. 12. Patient will receive at least 3 hours of therapy per day at least 5 days per week. 13. ELOS: 2-3 weeks      Prognosis:  good   Medical Problem List and Plan: 1. DVT Prophylaxis/Anticoagulation: Mechanical: Sequential compression devices, below knee Bilateral lower extremities 2. Pain Management:  May need long acting medication to help with symptoms. However, want to limit neurosedating meds as possible. 3. Mood:  Patient cognitively impaired due to ICH. Will monitor as mentation improves. Will have LCSW follow along for input/evaluation as appropriate. 4. Neuropsych: This patient is not capable of making decisions on his/her own behalf. 5. 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 BP now down to 110 range. Continue Vasotec, norvasc, Labetalol and aldactone--will discontinue HCTZ.  Need to monitor HR on labetalol--has been in 50's range in last 24 hours.  6. Hyponatremia: ?Cerebral salt wasting due to BI compounded by diuretics. Will discontinue HCTZ  7. Hypokalemia: supplement and recheck in am.  8. Seizure prophylaxis: continue keppra bid.  9. Dizziness: Will check orthostatic BP. May be symptomatic due to significant drop in  BP. Will set parameters on BP medicaitons.  10. Constipation : will set bowel program.   Ranelle Oyster, MD, [facility]  06/23/2012

## 2012-06-23 NOTE — Progress Notes (Signed)
Physical Therapy Treatment Patient Details Name: Luis Oconnor MRN: [id number] DOB: 1955-11-15 Today's Date: 06/23/2012 Time: 1005-1027 PT Time Calculation (min): 22 min  PT Assessment / Plan / Recommendation Comments on Treatment Session  Pt still lethargic but easier to arouse.  Pt educated on importance of getting out of bed and moving.  Pt requires more time to process instructions but generally was able to do what was asked - decreased (A) needed for bed mobility.  Pt continues to require +2 (A) for ambulation due to LOB.    Follow Up Recommendations  CIR     Does the patient have the potential to tolerate intense rehabilitation     Barriers to Discharge        Equipment Recommendations  None recommended by PT    Recommendations for Other Services    Frequency Min 4X/week   Plan Discharge plan remains appropriate;Frequency remains appropriate    Precautions / Restrictions Precautions Precautions: Fall Restrictions Weight Bearing Restrictions: No   Pertinent Vitals/Pain Pt denies pain    Mobility  Bed Mobility Bed Mobility: Supine to Sit;Sitting - Scoot to Edge of Bed Supine to Sit: 4: Min assist Sitting - Scoot to [place] of Bed: 4: Min assist Details for Bed Mobility Assistance: increased time to process, delayed response, cues to stay on task, (A) for stability in sitting. Transfers Transfers: Sit to Stand;Stand to Sit Sit to Stand: 4: Min assist;With upper extremity assist;From bed Stand to Sit: 4: Min assist;With upper extremity assist;To bed Details for Transfer Assistance: (A) for stability once standing.  Pt indicated he felt dizzy initially upon standing.  Pt sat back in bed prior to taking BP.  BP in sitting 139/99.  BP in standing 109/77.  Ambulation/Gait Ambulation/Gait Assistance: 1: +2 Total assist Ambulation/Gait: Patient Percentage: 60% Ambulation Distance (Feet): 60 Feet Assistive device: 2 person hand held assist Ambulation/Gait Assistance Details:  2 person HHA for balance and stability.  Cues to look up.  Gait Pattern: Step-through pattern;Scissoring;Narrow base of support Gait velocity: decreased General Gait Details: Pt requires increased (A) for ambulation due to unsteadiness and LOB, especially to left.    Exercises     PT Diagnosis:    PT Problem List:   PT Treatment Interventions:     PT Goals Acute Rehab PT Goals Time For Goal Achievement: 07/01/12 Potential to Achieve Goals: Good Pt will go Supine/Side to Sit: with supervision PT Goal: Supine/Side to Sit - Progress: Progressing toward goal Pt will go Sit to Stand: with supervision PT Goal: Sit to Stand - Progress: Progressing toward goal Pt will go Stand to Sit: with supervision PT Goal: Stand to Sit - Progress: Progressing toward goal Pt will Ambulate: >150 feet;with min assist;with least restrictive assistive device PT Goal: Ambulate - Progress: Progressing toward goal  Visit Information  Last PT Received On: 06/23/12 Assistance Needed: +2    Subjective Data  Subjective: Pt received in bed.  Denies pain.    Cognition  Cognition Overall Cognitive Status: Impaired Area of Impairment: Attention;Following commands;Safety/judgement;Awareness of deficits Arousal/Alertness: Lethargic Orientation Level: Person;Place;Situation Behavior During Session: Lethargic Current Attention Level: Sustained Attention - Other Comments: Pt keeps closing his eyes.  Needs lots of encouragement to participate in therapy. Following Commands: Follows one step commands inconsistently;Follows one step commands with increased time Safety/Judgement: Decreased awareness of safety precautions;Decreased safety judgement for tasks assessed Awareness of Errors: Assistance required to identify errors made;Assistance required to correct errors made Awareness of Errors - Other Comments: Pt concerned about  falling but has difficulty maintaining focus on task to correct errors. Cognition - Other  Comments: Pt has difficulty staying focused on task.  Looks down and closes his eyes often.    Balance     End of Session PT - End of Session Equipment Utilized During Treatment: Gait belt Activity Tolerance: Patient limited by fatigue Patient left: in chair;with call bell/phone within reach;with family/visitor present Nurse Communication: Mobility status   GP     Enid Baas, SPTA 06/23/2012, 11:57 AM

## 2012-06-23 NOTE — Discharge Summary (Signed)
  Physician Discharge Summary  Patient ID: Luis Oconnor MRN: [id number] DOB/AGE: 1955-07-16 57 y.o.  Admit date: 06/14/2012 Discharge date: 06/23/2012  Admission Diagnoses:  Discharge Diagnoses:  Active Problems:   Malignant hypertension   Fever   Discharged Condition: good  Hospital Course: Came to er after onset of headache. Found to have right temporal hemorrhage c/w hypertensive hemorrhage. Takenm to OR for craniotomy. Did well post op. Some residual blood deep in cavity, but only 1 mm of shift. Was doing much better. Slowly increased activity. By pod 9 was ready to go to rehab. They felt he was a good candidate, and was discharged for CIR.  Consults: rehabilitation medicine: CCM  Significant Diagnostic Studies: none  Treatments: surgery: right temporal craniotomy  Discharge Exam: Blood pressure 128/82, pulse 58, temperature 98.2 F (36.8 C), temperature source Oral, resp. rate 18, height 5\' 4"  (1.626 m), weight 66.5 kg (146 lb 9.7 oz), SpO2 99.00%. Incision/Wound:healing well; awake, conversnat  Disposition: 01-Home or Self Care  Discharge Orders   Future Orders Complete By Expires     Call MD for:  difficulty breathing, headache or visual disturbances  As directed     Call MD for:  hives  As directed     Call MD for:  persistant nausea and vomiting  As directed     Call MD for:  redness, tenderness, or signs of infection (pain, swelling, redness, odor or green/yellow discharge around incision site)  As directed     Call MD for:  severe uncontrolled pain  As directed     Call MD for:  temperature >100.4  As directed     Diet general  As directed     Discharge instructions  As directed     Comments:      Mostly bedrest. Get up 9 or 10 times each day and walk for 15-20 minutes each time. Very little sitting the first week. No riding in the car until your first post op appointment. If you had neck surgery...may shower from the chest down. If you had low back  surgery....you may shower with a saran wrap covering over the incision. Take your pain medicine as needed...and other medicines that you are instructed to take. Call for an appointment...[phone].        Medication List    TAKE these medications       hydrochlorothiazide 25 MG tablet  Commonly known as:  HYDRODIURIL  Take 1 tablet (25 mg total) by mouth daily.     metoprolol 100 MG tablet  Commonly known as:  LOPRESSOR  Take 0.5 tablets (50 mg total) by mouth 2 (two) times daily.     oxymetazoline 0.05 % nasal spray  Commonly known as:  AFRIN  Place 2 sprays into the nose 2 (two) times daily.         At home rest most of the time. Get up 9 or 10 times each day and take a 15 or 20 minute walk. No riding in the car and to your first postoperative appointment. If you have neck surgery you may shower from the chest down starting on the third postoperative day. If you had back surgery he may start showering on the third postoperative day with saran wrap wrapped around your incisional area 3 times. After the shower remove the saran wrap. Take pain medicine as needed and other medications as instructed. Call my office for an appointment.  SignedReinaldo Meeker, MD 06/23/2012, 10:40 AM

## 2012-06-23 NOTE — Interval H&P Note (Signed)
Luis Oconnor was admitted today to Inpatient Rehabilitation with the diagnosis of right temporal-parietal intraparenchymal hemorrhage.  The patient's history has been reviewed, patient examined, and there is no change in status.  Patient continues to be appropriate for intensive inpatient rehabilitation.  I have reviewed the patient's chart and labs.  Questions were answered to the patient's satisfaction.  Lakeya Mulka T 06/23/2012, 8:45 PM

## 2012-06-24 ENCOUNTER — Inpatient Hospital Stay (HOSPITAL_COMMUNITY): Payer: Medicaid Other | Admitting: Occupational Therapy

## 2012-06-24 ENCOUNTER — Inpatient Hospital Stay (HOSPITAL_COMMUNITY): Payer: Medicaid Other | Admitting: Speech Pathology

## 2012-06-24 ENCOUNTER — Inpatient Hospital Stay (HOSPITAL_COMMUNITY): Payer: Medicaid Other | Admitting: Physical Therapy

## 2012-06-24 DIAGNOSIS — R519 Headache, unspecified: Secondary | ICD-10-CM

## 2012-06-24 DIAGNOSIS — R51 Headache: Secondary | ICD-10-CM

## 2012-06-24 DIAGNOSIS — I619 Nontraumatic intracerebral hemorrhage, unspecified: Secondary | ICD-10-CM

## 2012-06-24 DIAGNOSIS — E871 Hypo-osmolality and hyponatremia: Secondary | ICD-10-CM

## 2012-06-24 DIAGNOSIS — I10 Essential (primary) hypertension: Secondary | ICD-10-CM

## 2012-06-24 LAB — CBC WITH DIFFERENTIAL/PLATELET
Basophils Absolute: 0.1 10*3/uL (ref 0.0–0.1)
HCT: 43.1 % (ref 39.0–52.0)
Lymphocytes Relative: 19 % (ref 12–46)
Monocytes Absolute: 0.9 10*3/uL (ref 0.1–1.0)
Neutro Abs: 3.7 10*3/uL (ref 1.7–7.7)
RBC: 5.45 MIL/uL (ref 4.22–5.81)
RDW: 12 % (ref 11.5–15.5)
WBC: 6 10*3/uL (ref 4.0–10.5)

## 2012-06-24 LAB — COMPREHENSIVE METABOLIC PANEL
ALT: 16 U/L (ref 0–53)
AST: 18 U/L (ref 0–37)
CO2: 24 mEq/L (ref 19–32)
Chloride: 89 mEq/L — ABNORMAL LOW (ref 96–112)
Creatinine, Ser: 1.2 mg/dL (ref 0.50–1.35)
GFR calc non Af Amer: 65 mL/min — ABNORMAL LOW (ref >90)
Sodium: 126 mEq/L — ABNORMAL LOW (ref 135–145)
Total Bilirubin: 1 mg/dL (ref 0.3–1.2)

## 2012-06-24 MED ORDER — POLYETHYLENE GLYCOL 3350 17 G PO PACK
17.0000 g | PACK | Freq: Two times a day (BID) | ORAL | Status: DC
  Administered 2012-06-25 – 2012-07-04 (×18): 17 g via ORAL
  Filled 2012-06-24 (×22): qty 1

## 2012-06-24 MED ORDER — LABETALOL HCL 100 MG PO TABS
100.0000 mg | ORAL_TABLET | Freq: Two times a day (BID) | ORAL | Status: DC
  Administered 2012-06-24 – 2012-07-03 (×18): 100 mg via ORAL
  Filled 2012-06-24 (×20): qty 1

## 2012-06-24 MED ORDER — ENALAPRIL MALEATE 5 MG PO TABS
5.0000 mg | ORAL_TABLET | Freq: Every day | ORAL | Status: DC
  Administered 2012-06-25: 5 mg via ORAL
  Filled 2012-06-24 (×3): qty 1

## 2012-06-24 MED ORDER — SPIRONOLACTONE 25 MG PO TABS
25.0000 mg | ORAL_TABLET | Freq: Two times a day (BID) | ORAL | Status: DC
  Administered 2012-06-24: 25 mg via ORAL
  Filled 2012-06-24 (×4): qty 1

## 2012-06-24 NOTE — Evaluation (Signed)
Physical Therapy Assessment and Plan  Patient Details  Name: Luis Oconnor MRN: [id number] Date of Birth: [birth year]  PT Diagnosis: Abnormality of gait, Cognitive deficits, Difficulty walking and Muscle weakness Rehab Potential: Good ELOS: 2 weeks   Today's Date: 06/24/2012 Time: 1000-1055 55 minutes  Problem List:  Patient Active Problem List  Diagnosis  . Malignant hypertension  . Fever  . Cerebral parenchymal hemorrhage  . HTN (hypertension)  . Hyponatremia  . Headache    Past Medical History:  Past Medical History  Diagnosis Date  . Hypertension   . Ulcer    Past Surgical History:  Past Surgical History  Procedure Laterality Date  . Craniotomy Right 06/14/2012    Procedure: CRANIOTOMY HEMATOMA EVACUATION SUBDURAL;  Surgeon: [name], MD;  Location: [facility];  Service: Neurosurgery;  Laterality: Right;    Assessment & Plan Clinical Impression: Patient is a 57 y.o. year old male with recent admission to the hospital on 06/14/12 with right sided headache, blurred vision and confusion. CT head done revealing 3.6 x 5.8 x 6.5 cm right temporoparietal intraparenchymal hematoma with mass effect and 4 mm right to left midline shift. Dr. Gerlene Fee was consulted and patient taken to OR emergently for right temporoparietal craniotomy for evacuation of hematoma. Post op with fever and elevated BP. Fever being monitored off antibiotics as felt to be due to ICH. Follow up CCT with improvement in pneumocephalus, no hydo- cephalus and no change in edema.  Patient transferred to CIR on 06/23/2012 .   Patient currently requires mod with mobility secondary to muscle weakness, decreased coordination, decreased initiation, decreased attention, decreased awareness, decreased safety awareness and delayed processing and decreased standing balance, decreased postural control and decreased balance strategies.  Prior to hospitalization, patient was independent  with mobility and lived with  Significant other in a Apartment home.  Home access is  Level entry.  Patient will benefit from skilled PT intervention to maximize safe functional mobility, minimize fall risk and decrease caregiver burden for planned discharge home with 24 hour supervision.  Anticipate patient will benefit from follow up HH at discharge.  PT - End of Session Activity Tolerance: Tolerates 30+ min activity with multiple rests Endurance Deficit: Yes PT Assessment Rehab Potential: Good PT Plan PT Intensity: Minimum of 1-2 x/day ,45 to 90 minutes PT Frequency: 5 out of 7 days PT Duration Estimated Length of Stay: 2 weeks PT Treatment/Interventions: Ambulation/gait training;Discharge planning;Functional mobility training;Therapeutic Activities;Therapeutic Exercise;Wheelchair propulsion/positioning;Neuromuscular re-education;Balance/vestibular training;Cognitive remediation/compensation;DME/adaptive equipment instruction;Pain management;Splinting/orthotics;UE/LE Strength taining/ROM;UE/LE Media planner education;Functional electrical stimulation;Community reintegration PT Recommendation Follow Up Recommendations: Home health PT Patient destination: Home  Skilled Therapeutic Intervention  Gait training without AD multiple attempts 25'-30'.  Pt reports feeling "unsteady", gait with small BOS and decreased step and stride length due to perceptual impairments.  Stair training x 3 stairs with B handrails with min-mod cuing for sequencing and motor planning, mod A for eccentric control.  Standing balance with mini squats and marches with mod A for LE strength and balance.  Reaching task with objects to L due to L ? visual deficts vs inattention.  Pt over reaching with L UE to find object.  Pt requires frequent rests and c/o feeling "tired" throughout session requiring frequent breaks.  PT Evaluation Precautions/Restrictions Precautions Precautions: Fall Precaution Comments: BP,  orthostatic Restrictions Weight Bearing Restrictions: No  Pain Pain Assessment Pain Assessment: No/denies pain  Home Living/Prior Functioning Home Living Lives With: Significant other Available Help at Discharge: Friend(s);Family Type of Home:  Apartment Home Access: Level entry Home Layout: One level Bathroom Shower/Tub: Engineer, manufacturing systems: Standard Prior Function Level of Independence: Independent with gait;Independent with transfers;Independent with basic ADLs Able to Take Stairs?: Yes Driving: No Vocation: Part time employment  Cognition Overall Cognitive Status: Impaired Arousal/Alertness: Lethargic Orientation Level: Oriented to person;Oriented to situation Attention: Focused;Sustained Focused Attention: Appears intact Sustained Attention: Appears intact Sustained Attention Impairment: Functional complex Awareness: Impaired Awareness Impairment: Intellectual impairment;Emergent impairment Initiating: Impaired Initiating Impairment: Verbal basic;Functional basic Safety/Judgment: Impaired Sensation Sensation Light Touch: Appears Intact Proprioception: Appears Intact Coordination Gross Motor Movements are Fluid and Coordinated: No Fine Motor Movements are Fluid and Coordinated: Yes Coordination and Movement Description: small BOS and step length ? perceptual deficits Motor  Motor Motor: Abnormal postural alignment and control Motor - Skilled Clinical Observations: generalized weakness  Mobility Transfers Stand Pivot Transfers: 4: Min assist Stand Pivot Transfer Details (indicate cue type and reason): cues for sequencing, wt shifts and balance Locomotion  Ambulation Ambulation: Yes Ambulation/Gait Assistance: 3: Mod assist Ambulation Distance (Feet): 30 Feet Assistive device: None Ambulation/Gait Assistance Details: assist for wt shift and steadying assist for balance, pt with small BOS and decreased step/stride length Stairs / Additional  Locomotion Stairs: Yes Stairs Assistance: 4: Min assist Stair Management Technique: Two Occupational hygienist Mobility: Yes Wheelchair Assistance: 5: Investment banker, operational: Both upper extremities Wheelchair Parts Management: Needs assistance Distance: 100'  Trunk/Postural Assessment  Cervical Assessment Cervical Assessment: Within Functional Limits Thoracic Assessment Thoracic Assessment: Within Functional Limits Lumbar Assessment Lumbar Assessment: Within Functional Limits Postural Control Postural Control:  (delayed righting reactions)  Balance Static Sitting Balance Static Sitting - Level of Assistance: 5: Stand by assistance Static Standing Balance Static Standing - Balance Support: During functional activity Static Standing - Level of Assistance: 3: Mod assist Extremity Assessment  RUE Assessment RUE Assessment: Within Functional Limits LUE Assessment LUE Assessment: Within Functional Limits RLE Assessment RLE Assessment: Within Functional Limits LLE Assessment LLE Assessment: Within Functional Limits  FIM:  FIM - Bed/Chair Transfer Bed/Chair Transfer: 4: Chair or W/C > Bed: Min A (steadying Pt. > 75%);4: Bed > Chair or W/C: Min A (steadying Pt. > 75%) FIM - Locomotion: Wheelchair Distance: 100' Locomotion: Wheelchair: 2: Travels 50 - 149 ft with supervision, cueing or coaxing FIM - Locomotion: Ambulation Ambulation/Gait Assistance: 3: Mod assist Locomotion: Ambulation: 1: Travels less than 50 ft with moderate assistance (Pt: 50 - 74%) FIM - Locomotion: Stairs Locomotion: Stairs: 1: Up and Down < 4 stairs with minimal assistance (Pt.>75%)   Refer to Care Plan for Long Term Goals  Recommendations for other services: Neuropsych  Discharge Criteria: Patient will be discharged from PT if patient refuses treatment 3 consecutive times without medical reason, if treatment goals not met, if there is a change in medical status, if patient  makes no progress towards goals or if patient is discharged from hospital.  The above assessment, treatment plan, treatment alternatives and goals were discussed and mutually agreed upon: by patient  [facility] 06/24/2012, 10:57 AM

## 2012-06-24 NOTE — Care Management Note (Signed)
    Page 1 of 1   06/24/2012     8:15:47 AM   CARE MANAGEMENT NOTE 06/24/2012  Patient:  Luis Oconnor, Luis Oconnor   Account Number:  [id number]  Date Initiated:  06/24/2012  Documentation initiated by:  [facility]  Subjective/Objective Assessment:   admitted after rt temporoparietal craniotomy for evacuation of hematoma     Action/Plan:   PT/OT evals- recommended inpatient rehab   Anticipated DC Date:     Anticipated DC Plan:        DC Planning Services  CM consult      Choice offered to / List presented to:             Status of service:  Completed, signed off Medicare Important Message given?   (If response is "NO", the following Medicare IM given date fields will be blank) Date Medicare IM given:   Date Additional Medicare IM given:    Discharge Disposition:  IP REHAB FACILITY  Per UR Regulation:  Reviewed for med. necessity/level of care/duration of stay  If discussed at Long Length of Stay Meetings, dates discussed:   06/23/2012    Comments:

## 2012-06-24 NOTE — Evaluation (Signed)
Occupational Therapy Assessment and Plan  Patient Details  Name: SUMEET GETER MRN: [id number] Date of Birth: [birth year]  OT Diagnosis: acute pain, cognitive deficits, disturbance of vision and muscle weakness (generalized) Rehab Potential: Rehab Potential: Good ELOS: 2 1/2 weeks   Today's Date: 06/24/2012 Time: 0730-0825 Time Calculation (min): 55 min  Problem List:  Patient Active Problem List  Diagnosis  . Malignant hypertension  . Fever  . Cerebral parenchymal hemorrhage  . HTN (hypertension)  . Hyponatremia  . Headache    Past Medical History:  Past Medical History  Diagnosis Date  . Hypertension   . Ulcer    Past Surgical History:  Past Surgical History  Procedure Laterality Date  . Craniotomy Right 06/14/2012    Procedure: CRANIOTOMY HEMATOMA EVACUATION SUBDURAL;  Surgeon: [name], MD;  Location: [facility];  Service: Neurosurgery;  Laterality: Right;    Assessment & Plan Clinical Impression: Patient is a 57 y.o. year old male with history of HTN-no medications due to financial constraints. He was admitted on 06/14/12 with right sided headache, blurred vision and confusion. CT head done revealing 3.6 x 5.8 x 6.5 cm right temporoparietal intraparenchymal hematoma with mass effect and 4 mm right to left midline shift. Dr. Gerlene Fee was consulted and patient taken to OR emergently for right temporoparietal craniotomy for evacuation of hematoma. Post op with fever and elevated BP. Fever being monitored off antibiotics as felt to be due to ICH. Follow up CCT with improvement in pneumocephalus, no hydo- cephalus and no change in edema. PT/OT evaluations done and patient limited by visual deficits as well as poor attention, ataxia and balance deficits. He continues to have bouts of lethargy with complaints of severe HA.   Patient transferred to CIR on 06/23/2012 .    Patient currently requires mod with basic self-care skills and basic mobility  secondary to muscle  weakness, decreased cardiorespiratoy endurance and acute HA, decreased coordination, decreased visual perceptual skills and left field visual deficit, decreased attention to left, decreased initiation, decreased attention, decreased awareness, decreased problem solving, decreased safety awareness and delayed processing and decreased standing balance, decreased postural control, decreased balance strategies and difficulty maintaining precautions.  Prior to hospitalization, patient could complete ADL with independent .  Patient will benefit from skilled intervention to decrease level of assist with basic self-care skills and increase independence with basic self-care skills prior to discharge home with care partner.  Anticipate patient will require 24 hour supervision and follow up home health.  OT - End of Session Activity Tolerance: Tolerates 30+ min activity with multiple rests Endurance Deficit: Yes OT Assessment Rehab Potential: Good OT Plan OT Intensity: Minimum of 1-2 x/day, 45 to 90 minutes OT Frequency: 5 out of 7 days OT Duration/Estimated Length of Stay: 2 1/2 weeks OT Treatment/Interventions: Balance/vestibular training;Cognitive remediation/compensation;Community reintegration;Discharge planning;DME/adaptive equipment instruction;Functional mobility training;Neuromuscular re-education;Patient/family education;Self Care/advanced ADL retraining;Skin care/wound managment;Psychosocial support;Pain management;Therapeutic Activities;Therapeutic Exercise;UE/LE Coordination activities;UE/LE Strength taining/ROM;Visual/perceptual remediation/compensation;Wheelchair propulsion/positioning OT Recommendation Patient destination: Home Follow Up Recommendations: Outpatient OT   Skilled Therapeutic Intervention   OT Evaluation Precautions/Restrictions  Precautions Precautions: Fall Precaution Comments: BP, orthostatic Restrictions Weight Bearing Restrictions: No General Chart Reviewed:  Yes Family/Caregiver Present: Yes (wife Rosemarry) Vital Signs Therapy Vitals Temp: 98.1 F (36.7 C) Temp src: Oral Pulse Rate: 56 Resp: 18 BP: 125/80 mmHg Patient Position, if appropriate: Lying Oxygen Therapy SpO2: 98 % O2 Device: None (Room air) Pain Pain Assessment Pain Score: 10-Worst pain ever Pain Type: Acute pain Pain Location: Head Pain  Onset: On-going Pain Intervention(s): RN made aware Home Living/Prior Functioning Home Living Lives With: Significant other Available Help at Discharge: Family;Available 24 hours/day Type of Home: Apartment Home Access: Level entry Home Layout: One level Bathroom Shower/Tub: Engineer, manufacturing systems: Standard ADL  see FIM Vision/Perception  Vision - History Baseline Vision: Wears glasses only for reading Patient Visual Report: Blurring of vision;Unable to keep objects in focus Vision - Assessment Vision Assessment: Vision tested Saccades: Additional eye shifts occurred during testing;Additional head turns occurred during testing;Undershoots;Decreased speed of saccadic movement Convergence: Impaired (comment) Visual Fields: Left visual field deficit Perception Perception: Impaired Spatial Orientation: impaired Praxis Praxis: Intact  Cognition Overall Cognitive Status: Impaired Arousal/Alertness: Awake/alert Orientation Level: Oriented to person;Oriented to situation;Oriented to place Attention: Focused;Sustained Focused Attention: Appears intact Sustained Attention: Impaired Sustained Attention Impairment: Verbal complex;Functional complex Awareness: Impaired Awareness Impairment: Intellectual impairment;Emergent impairment Executive Function: Organizing;Initiating Reasoning: Impaired Reasoning Impairment: Verbal complex;Functional complex Initiating: Impaired Initiating Impairment: Verbal basic;Functional basic Behaviors: Perseveration Safety/Judgment: Impaired Sensation Sensation Light Touch: Appears  Intact Proprioception: Appears Intact Coordination Gross Motor Movements are Fluid and Coordinated: Yes Fine Motor Movements are Fluid and Coordinated: Yes Motor  Motor Motor - Skilled Clinical Observations: generalized weakness Mobility    Bed mobility performing with min A with tactile cues for initiation and follow through to come to EOB Transfer bed to chair with stand pivot with mod A with c/o mild dizziness Trunk/Postural Assessment  Cervical Assessment Cervical Assessment: Within Functional Limits Thoracic Assessment Thoracic Assessment: Within Functional Limits Lumbar Assessment Lumbar Assessment: Within Functional Limits Postural Control Postural Control:  (fair)  Balance  sitting balance min A to supervision Standing balance during functional activity: min to mod A Extremity/Trunk Assessment RUE Assessment RUE Assessment: Within Functional Limits LUE Assessment LUE Assessment: Within Functional Limits  FIM:  FIM - Bathing Bathing Steps Patient Completed: Chest;Right Arm;Left Arm;Abdomen;Left upper leg;Right upper leg;Buttocks;Front perineal area;Right lower leg (including foot);Left lower leg (including foot) Bathing: 4: Min-Patient completes 8-9 62f 10 parts or 75+ percent FIM - Upper Body Dressing/Undressing Upper body dressing/undressing steps patient completed: Thread/unthread left sleeve of pullover shirt/dress;Put head through opening of pull over shirt/dress;Pull shirt over trunk Upper body dressing/undressing: 4: Min-Patient completed 75 plus % of tasks FIM - Lower Body Dressing/Undressing Lower body dressing/undressing steps patient completed: Thread/unthread left underwear leg;Pull underwear up/down;Thread/unthread right pants leg;Thread/unthread left pants leg;Don/Doff left sock;Don/Doff right shoe;Don/Doff left shoe;Don/Doff right sock;Fasten/unfasten right shoe Lower body dressing/undressing: 4: Min-Patient completed 75 plus % of tasks FIM - Bed/Chair  Transfer Bed/Chair Transfer: 4: Supine > Sit: Min A (steadying Pt. > 75%/lift 1 leg);4: Bed > Chair or W/C: Min A (steadying Pt. > 75%);4: Chair or W/C > Bed: Min A (steadying Pt. > 75%)   Refer to Care Plan for Long Term Goals  Recommendations for other services: Neuropsych  Discharge Criteria: Patient will be discharged from OT if patient refuses treatment 3 consecutive times without medical reason, if treatment goals not met, if there is a change in medical status, if patient makes no progress towards goals or if patient is discharged from hospital.  The above assessment, treatment plan, treatment alternatives and goals were discussed and mutually agreed upon: by patient and by family  1:1 OT eval initiated with OT role, purpose and goals discussed. Self care retraining at sink level with focus on sit to to stands, standing balance, attention to task, visual attention, perceptual skills, simple problem solving, activity tolerance, working memory. Pt with significant difficulty with orienting clothing and  donning due to perceptual difficulties requiring max - total A. Pt with difficulty tracking; additional head turns with scanning.  Roney Mans [facility] 06/24/2012, 8:34 AM

## 2012-06-24 NOTE — Evaluation (Signed)
Speech Language Pathology Assessment and Plan  Patient Details  Name: Luis Oconnor MRN: [id number] Date of Birth: 01-03-1956  SLP Diagnosis: Cognitive Impairments  Rehab Potential: Good ELOS: 2-3 weeks   Today's Date: 06/24/2012 Time: 1100-1155 Time Calculation (min): 55 min  Skilled Therapeutic Intervention: Administered cognitive-linguistic evaluation. Please see below for details. Pt and his wife educated on current cognitive impairments and goals of skilled SLP intervention. Both verbalized understanding.   Problem List:  Patient Active Problem List  Diagnosis  . Malignant hypertension  . Fever  . Cerebral parenchymal hemorrhage  . HTN (hypertension)  . Hyponatremia  . Headache   Past Medical History:  Past Medical History  Diagnosis Date  . Hypertension   . Ulcer    Past Surgical History:  Past Surgical History  Procedure Laterality Date  . Craniotomy Right 06/14/2012    Procedure: CRANIOTOMY HEMATOMA EVACUATION SUBDURAL;  Surgeon: [name], MD;  Location: [facility];  Service: Neurosurgery;  Laterality: Right;    Assessment / Plan / Recommendation Clinical Impression  Patient is a 57 y.o. year old male with history of HTN-no medications due to financial constraints. He was admitted on 06/14/12 with right sided headache, blurred vision and confusion. CT head done revealing 3.6 x 5.8 x 6.5 cm right temporoparietal intraparenchymal hematoma with mass effect and 4 mm right to left midline shift. Patient taken to OR emergently for right temporoparietal craniotomy for evacuation of hematoma. Post op with fever and elevated BP. Fever being monitored off antibiotics as felt to be due to ICH. Follow up CCT with improvement in pneumocephalus, no hydo- cephalus and no change in edema. He continues to have bouts of lethargy with complaints of severe HA. Patient transferred to CIR on 06/23/2012 and presents with moderate cognitive impairments characterized by decreased visual  perceptual skills, decreased attention to left, decreased initiation, decreased attention, decreased awareness, decreased problem solving, deceased working memory, decreased safety awareness and delayed processing. Patient will benefit from skilled SLP intervention to maximize cognitive function and increase functional independence prior to discharge home with care partner. Anticipate patient will require 24 hour supervision and follow up home health.     SLP Assessment  Patient will need skilled Speech Lanaguage Pathology Services during CIR admission    Recommendations  Oral Care Recommendations: Oral care BID Patient destination: Home Follow up Recommendations: Home Health SLP;24 hour supervision/assistance Equipment Recommended: None recommended by SLP    SLP Frequency 5 out of 7 days   SLP Treatment/Interventions Cueing hierarchy;Cognitive remediation/compensation;Environmental controls;Internal/external aids;Patient/family education;Therapeutic Activities;Functional tasks    Pain Pain Assessment Pain Assessment: No/denies pain Prior Functioning Type of Home: Apartment Lives With: Significant other Available Help at Discharge: Friend(s);Family Vocation: Part time employment  Short Term Goals: Week 1: SLP Short Term Goal 1 (Week 1): Pt will identify 2 cognitive impairments with Mod A question and semantic cues.  SLP Short Term Goal 2 (Week 1): Pt will demonstrate selective attention during a functional and familiar task for 15 minutes with Mod A verbal cues for redirection. SLP Short Term Goal 3 (Week 1): Pt will demonstrate attention to left field of enviornment during functional and familiar tasks with mod A question and verbal cues.  SLP Short Term Goal 4 (Week 1): pt will demonstrate functional problem solving for functional and familiar tasks with Min A verbal cues.  SLP Short Term Goal 5 (Week 1): Pt will recall new, daily events with use of external memory aids with Min A  verbal and  question cues.   See FIM for current functional status Refer to Care Plan for Long Term Goals  Recommendations for other services: None  Discharge Criteria: Patient will be discharged from SLP if patient refuses treatment 3 consecutive times without medical reason, if treatment goals not met, if there is a change in medical status, if patient makes no progress towards goals or if patient is discharged from hospital.  The above assessment, treatment plan, treatment alternatives and goals were discussed and mutually agreed upon: by patient and by family  Luis Oconnor 06/24/2012, 1:15 PM

## 2012-06-24 NOTE — Progress Notes (Signed)
Occupational Therapy Session Note  Patient Details  Name: Luis Oconnor MRN: [id number] Date of Birth: Nov 25, 1955  Today's Date: 06/24/2012 Time: 1400-1430 Time Calculation (min): 30 min   Skilled Therapeutic Interventions/Progress Updates:    1:1 Focus on functional ambulation without a AD with attention to right and left environment, visual scanning, performing visual scanning activities (line bisection, letter cancellation, and drawing a clock). Pt continues to demonstrate inattention to left and difficulty with scanning. When pt reads wall sign on his left he misses the beginning of the words or letter; but when reads it aloud pt makes up words with decreased awareness of made up words.  Therapy Documentation Precautions:  Precautions Precautions: Fall Precaution Comments: BP, orthostatic Restrictions Weight Bearing Restrictions: No Pain: Pain Assessment Pain Assessment: No/denies pain  See FIM for current functional status  Therapy/Group: Individual Therapy  Roney Mans [facility] 06/24/2012, 3:52 PM

## 2012-06-24 NOTE — Progress Notes (Signed)
Patient information reviewed and entered into eRehab system by Demaria Deeney, RN, CRRN, PPS Coordinator.  Information including medical coding and functional independence measure will be reviewed and updated through discharge.    

## 2012-06-24 NOTE — Progress Notes (Signed)
Subjective/Complaints:   Objective: Vital Signs: Blood pressure 125/80, pulse 56, temperature 98.1 F (36.7 C), temperature source Oral, resp. rate 18, height 5\' 6"  (1.676 m), weight 62.5 kg (137 lb 12.6 oz), SpO2 98.00%. Ct Head Wo Contrast  06/23/2012  *RADIOLOGY REPORT*  Clinical Data: Follow-up.  No significant status changes recently.  CT HEAD WITHOUT CONTRAST  Technique:  Contiguous axial images were obtained from the base of the skull through the vertex without contrast.  Comparison: 06/19/2012.  Findings: Persistent 2 mm of right to left midline shift. Postoperative changes of right parietal craniotomy.  Right temporal parenchymal hematoma with surrounding vasogenic edema shows expected evolutionary changes.  Edema in the right parietal lobe appears similar.  Improvement in pneumocephalus associated with decompression.  Streak artifact is present in the left frontal region.  Blood along the falx appears similar.  There is no hydrocephalus.  Third and fourth ventricles appear decompressed.  IMPRESSION: Expected evolution of right temporal hematoma and postoperative changes of right parietal craniotomy.  Unchanged 2 mm right to left midline shift.   Original Report Authenticated By: Andreas Newport, M.D.     Recent Labs  06/24/12 0535  WBC 6.0  HGB 15.8  HCT 43.1  PLT 287    Recent Labs  06/24/12 0535  NA 126*  K 3.8  CL 89*  GLUCOSE 115*  BUN 26*  CREATININE 1.20  CALCIUM 9.1   CBG (last 3)   Recent Labs  06/21/12 1100  GLUCAP 143*    Wt Readings from Last 3 Encounters:  06/23/12 62.5 kg (137 lb 12.6 oz)  06/20/12 66.5 kg (146 lb 9.7 oz)  06/20/12 66.5 kg (146 lb 9.7 oz)    Physical Exam:  Constitutional: He appears well-nourished.   HENT:  Head: Normocephalic and atraumatic.  Right Ear: External ear normal.  Left Ear: External ear normal.  Nose: Nose normal.  Mouth/Throat: Oropharynx is clear and moist.  Right crani incision with staples in place. Wound  well approximated Eyes: Conjunctivae are normal. Pupils are equal, round, and reactive to light. Right eye exhibits no discharge. Left eye exhibits discharge.  Neck: Normal range of motion. No JVD present. No tracheal deviation present. No thyromegaly present.  Cardiovascular: Normal rate and regular rhythm. Exam reveals no gallop and no friction rub.  No murmur heard.  Pulmonary/Chest: Effort normal. No respiratory distress. He has no wheezes. He has no rales. He exhibits no tenderness.  Abdominal: Soft. Bowel sounds are normal. He exhibits no distension. There is no tenderness. There is no rebound.  Musculoskeletal: He exhibits no edema.  Lymphadenopathy:  He has cervical adenopathy.  Neurological:    Awakened easily. Knew month. Missed day by one day. Followed all simple commands. Moved all 4's with 3+ to 4/5 strength. Sensed pain in all 4 limbs. Cn exam non-focal this am.   Psychiatric:  More alert and appropriate.  Assessment/Plan: 1. 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 Physiatrist is providing close team supervision and 24 hour management of active medical problems listed below. Physiatrist and rehab team continue to assess barriers to discharge/monitor patient progress toward functional and medical goals. FIM:                                 Medical Problem List and Plan:  1. DVT Prophylaxis/Anticoagulation: Mechanical: Sequential compression devices, below knee Bilateral  lower extremities  2. Pain Management: monitor for now. rx pain but want to limit neurosedating meds as possible.  3. 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  . Will have LCSW follow along for input/evaluation as appropriate.  4. Neuropsych: This patient is not capable of making decisions on his/her own behalf.  5. 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 BP now down to 110 range. Continue Vasotec, norvasc, Labetalol and aldactone-- discontinued HCTZ. Need to monitor HR on labetalol--has been in 50's range in last 24 hours.  6. Hyponatremia: may be central. Likely from HCTZ- some improvement today 126. Recheck tomorrow 7. Hypokalemia: supplement and recheck in am.  8. Seizure prophylaxis: continue keppra bid.  9. Dizziness:   check orthostatic BP's. May be symptomatic due to significant drop in BP.  Have set parameters on BP medicaitons.  10. Constipation :   bowel program.    LOS (Days) 1 A FACE TO FACE EVALUATION WAS PERFORMED  Wiliam Cauthorn T 06/24/2012 8:17 AM

## 2012-06-25 ENCOUNTER — Inpatient Hospital Stay (HOSPITAL_COMMUNITY): Payer: Medicaid Other | Admitting: Speech Pathology

## 2012-06-25 ENCOUNTER — Inpatient Hospital Stay (HOSPITAL_COMMUNITY): Payer: Medicaid Other | Admitting: Physical Therapy

## 2012-06-25 ENCOUNTER — Inpatient Hospital Stay (HOSPITAL_COMMUNITY): Payer: Self-pay

## 2012-06-25 ENCOUNTER — Inpatient Hospital Stay (HOSPITAL_COMMUNITY): Payer: Medicaid Other

## 2012-06-25 ENCOUNTER — Encounter (HOSPITAL_COMMUNITY): Payer: Self-pay | Admitting: Occupational Therapy

## 2012-06-25 LAB — BASIC METABOLIC PANEL
BUN: 25 mg/dL — ABNORMAL HIGH (ref 6–23)
CO2: 23 mEq/L (ref 19–32)
Chloride: 89 mEq/L — ABNORMAL LOW (ref 96–112)
Creatinine, Ser: 1.12 mg/dL (ref 0.50–1.35)
Glucose, Bld: 111 mg/dL — ABNORMAL HIGH (ref 70–99)

## 2012-06-25 MED ORDER — SODIUM CHLORIDE 0.9 % IV SOLN
INTRAVENOUS | Status: AC
  Administered 2012-06-25 – 2012-06-26 (×2): via INTRAVENOUS

## 2012-06-25 MED ORDER — FENTANYL 12 MCG/HR TD PT72
12.5000 ug | MEDICATED_PATCH | TRANSDERMAL | Status: DC
  Administered 2012-06-25 – 2012-07-01 (×3): 12.5 ug via TRANSDERMAL
  Filled 2012-06-25 (×3): qty 1

## 2012-06-25 NOTE — Progress Notes (Signed)
Occupational Therapy Session Note  Patient Details  Name: Luis Oconnor MRN: [id number] Date of Birth: March 20, 1956  Today's Date: 06/25/2012 Time: 0730-0830 Time Calculation (min): 60 min   Skilled Therapeutic Interventions/Progress Updates:    1;1 Pt asleep in bed when arrived. Pt required mod cuing to arouse and come to EOB. Pt with c/o HA . Sat EOB for a few minutes before walking to the bathroom to let BP become normal to decrease chance of getting dizziness. Pt ambulates with min HHA with cuing to look for objects in his left field.  Focus on sustained attention, functional problem solving, sit to stands, standing balance, initiation with all tasks. Pt required mod cuing to help orient clothing before donning. Pt does demonstrate awareness that he has trouble oriented clothing.  Therapy Documentation Precautions:  Precautions Precautions: Fall Precaution Comments: BP, orthostatic Restrictions Weight Bearing Restrictions: No Pain: Pain Assessment Pain Assessment: 0-10 Pain Score: Asleep Pain Type: Acute pain Pain Location: Head Pain Orientation: Right Pain Descriptors: Headache Pain Intervention(s): Medication (See eMAR)  See FIM for current functional status  Therapy/Group: Individual Therapy  Roney Mans [facility] 06/25/2012, 8:09 AM

## 2012-06-25 NOTE — Progress Notes (Signed)
Lloyd Ayo, PTA [phone] 06/25/2012  

## 2012-06-25 NOTE — Progress Notes (Signed)
Physical Therapy Note  Patient Details  Name: Luis Oconnor MRN: [id number] Date of Birth: Oct 15, 1955 Today's Date: 06/25/2012  Time: 1000-1057 57 minutes  No c/o pain.  Treatment session focused on increasing activity tolerance and standing endurance.  Gait training without AD with min steadying assist 100'x3, 50'x3.  Pt with improved endurance, decreased trunk control and increased LE buckling when fatigued.  Standing balance/reaching activity with horseshoe toss without UE support with min A, able to stand > 5 minutes without rest.  Able to pick up objects from floor and reach out of BOS with min A.  Nu step for UE/LE strength and endurance x 5 minutes with 3 short rest breaks.  Step ups to 4'' step without UE support with mod A for balance and strength.  Pt with R LE weaker than L, requires mod A to maintain balance with step ups and frequent rest breaks throughout.  Individual therapy  Luis Oconnor 06/25/2012, 10:58 AM

## 2012-06-25 NOTE — Progress Notes (Signed)
[facility] Individual Statement of Services  Patient Name:  Luis Oconnor  Date:  06/25/2012  Welcome to the [facility].  Our goal is to provide you with an individualized program based on your diagnosis and situation, designed to meet your specific needs.  With this comprehensive rehabilitation program, you will be expected to participate in at least 3 hours of rehabilitation therapies Monday-Friday, with modified therapy programming on the weekends.  Your rehabilitation program will include the following services:  Physical Therapy (PT), Occupational Therapy (OT), Speech Therapy (ST), 24 hour per day rehabilitation nursing, Therapeutic Recreaction (TR), Neuropsychology, Case Management (Social Worker), Rehabilitation Medicine, Nutrition Services and Pharmacy Services  Weekly team conferences will be held on Tuesdays to discuss your progress.  Your  Social Worker will talk with you frequently to get your input and to update you on team discussions.  Team conferences with you and your family in attendance may also be held.  Expected length of stay: 2 weeks  Overall anticipated outcome: supervision  Depending on your progress and recovery, your program may change.  Your  Social Worker will coordinate services and will keep you informed of any changes.  Your  Social Worker's name and contact numbers are listed  below.  The following services may also be recommended but are not provided by the [facility]:   Driving Evaluations  Home Health Rehabiltiation Services  Outpatient Rehabilitatation [facility]  Vocational Rehabilitation   Arrangements will be made to provide these services after discharge if needed.  Arrangements include referral to agencies that provide these services.  Your insurance has been verified to be:  Medicaid application pending Your primary doctor is:  None  Pertinent information will be shared with your  doctor and your insurance company.   Social Worker:  [place], [place] [phone] or [phone]  Information discussed with and copy given to patient by: Amada Jupiter, 06/25/2012, 12:47 PM

## 2012-06-25 NOTE — Progress Notes (Signed)
Pt. continues to c/o headache..."10/10."  Reports minimal relief from Oxy IR 10 mg... X 3 today. Fentyl patch 12.5mg  per orders. Appetite very poor.  Did eat ensure pudding.  Drowsy, arouses easily. IV started, fluids infusing at 1700 per orders.

## 2012-06-25 NOTE — Progress Notes (Signed)
Subjective/Complaints: Had some dizziness yesterday. Arousal can wax and wane.. A 12 point review of systems has been performed and if not noted above is otherwise negative.   Objective: Vital Signs: Blood pressure 143/98, pulse 70, temperature 98.3 F (36.8 C), temperature source Oral, resp. rate 18, height 5\' 6"  (1.676 m), weight 62.9 kg (138 lb 10.7 oz), SpO2 99.00%. No results found.  Recent Labs  06/24/12 0535  WBC 6.0  HGB 15.8  HCT 43.1  PLT 287    Recent Labs  06/24/12 0535 06/25/12 0555  NA 126* 123*  K 3.8 4.5  CL 89* 89*  GLUCOSE 115* 111*  BUN 26* 25*  CREATININE 1.20 1.12  CALCIUM 9.1 9.2   CBG (last 3)  No results found for this basename: GLUCAP,  in the last 72 hours  Wt Readings from Last 3 Encounters:  06/24/12 62.9 kg (138 lb 10.7 oz)  06/20/12 66.5 kg (146 lb 9.7 oz)  06/20/12 66.5 kg (146 lb 9.7 oz)    Physical Exam:  Constitutional: He appears well-nourished.   HENT:  Head: Normocephalic and atraumatic.  Right Ear: External ear normal.  Left Ear: External ear normal.  Nose: Nose normal.  Mouth/Throat: Oropharynx is clear and moist.  Right crani incision with staples in place. Wound well approximated Eyes: Conjunctivae are normal. Pupils are equal, round, and reactive to light. Right eye exhibits no discharge. Left eye exhibits discharge.  Neck: Normal range of motion. No JVD present. No tracheal deviation present. No thyromegaly present.  Cardiovascular: Normal rate and regular rhythm. Exam reveals no gallop and no friction rub.  No murmur heard.  Pulmonary/Chest: Effort normal. No respiratory distress. He has no wheezes. He has no rales. He exhibits no tenderness.  Abdominal: Soft. Bowel sounds are normal. He exhibits no distension. There is no tenderness. There is no rebound.  Musculoskeletal: He exhibits no edema.  Lymphadenopathy:  He has cervical adenopathy.  Neurological:    Awakened easily. Oriented to place, name, month.   Followed all simple commands. Moved all 4's with 3+ to 4/5 strength. Sensed pain in all 4 limbs. Cn exam non-focal this am.   Psychiatric:  Generally  alert and appropriate.  Assessment/Plan: 1. 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 Physiatrist is providing close team supervision and 24 hour management of active medical problems listed below. Physiatrist and rehab team continue to assess barriers to discharge/monitor patient progress toward functional and medical goals. FIM: FIM - Bathing Bathing Steps Patient Completed: Chest;Right Arm;Left Arm;Abdomen;Left upper leg;Right upper leg;Buttocks;Front perineal area;Right lower leg (including foot);Left lower leg (including foot) Bathing: 4: Steadying assist  FIM - Upper Body Dressing/Undressing Upper body dressing/undressing steps patient completed: Thread/unthread left sleeve of pullover shirt/dress;Put head through opening of pull over shirt/dress;Pull shirt over trunk;Thread/unthread right sleeve of pullover shirt/dresss Upper body dressing/undressing: 4: Min-Patient completed 75 plus % of tasks FIM - Lower Body Dressing/Undressing Lower body dressing/undressing steps patient completed: Thread/unthread left underwear leg;Pull underwear up/down;Thread/unthread right pants leg;Thread/unthread left pants leg;Don/Doff left sock;Don/Doff right shoe;Don/Doff left shoe;Don/Doff right sock;Fasten/unfasten right shoe;Thread/unthread right underwear leg;Pull pants up/down Lower body dressing/undressing: 4: Min-Patient completed 75 plus % of tasks  FIM - Toileting Toileting steps completed by patient: Adjust clothing prior to toileting;Performs perineal hygiene Toileting: 3: Mod-Patient completed 2 of 3 steps  FIM - Archivist Transfers: 4-To toilet/BSC: Min A (steadying Pt. > 75%);4-From toilet/BSC: Min A  (steadying  Pt. > 75%)  FIM - Bed/Chair Transfer Bed/Chair Transfer: 4: Supine > Sit: Min A (steadying Pt. > 75%/lift 1 leg);4: Bed > Chair or W/C: Min A (steadying Pt. > 75%);4: Chair or W/C > Bed: Min A (steadying Pt. > 75%)  FIM - Locomotion: Wheelchair Distance: 100' Locomotion: Wheelchair: 2: Travels 50 - 149 ft with supervision, cueing or coaxing FIM - Locomotion: Ambulation Ambulation/Gait Assistance: 3: Mod assist Locomotion: Ambulation: 1: Travels less than 50 ft with moderate assistance (Pt: 50 - 74%)  Comprehension Comprehension Mode: Auditory Comprehension: 4-Understands basic 75 - 89% of the time/requires cueing 10 - 24% of the time  Expression Expression Mode: Verbal Expression: 4-Expresses basic 75 - 89% of the time/requires cueing 10 - 24% of the time. Needs helper to occlude trach/needs to repeat words.  Social Interaction Social Interaction: 4-Interacts appropriately 75 - 89% of the time - Needs redirection for appropriate language or to initiate interaction.  Problem Solving Problem Solving: 3-Solves basic 50 - 74% of the time/requires cueing 25 - 49% of the time  Memory Memory: 3-Recognizes or recalls 50 - 74% of the time/requires cueing 25 - 49% of the time Medical Problem List and Plan:  1. DVT Prophylaxis/Anticoagulation: Mechanical: Sequential compression devices, below knee Bilateral lower extremities  2. Pain Management: monitor for now. rx pain but want to limit neurosedating meds as possible.  3. 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  . Will have LCSW follow along for input/evaluation as appropriate.  4. Neuropsych: This patient is not capable of making decisions on his/her own behalf.   -add ritalin for stimulation/attention 5. 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 BP now down to 110 range. Continue Vasotec, norvasc, Labetalol and aldactone-- discontinued HCTZ. Need to monitor HR on labetalol--has been in 50's range in last 24  hours.  6. Hyponatremia: may be central. ?diuretic relatd. hctz stopped. Hold aldactone also 7. Hypokalemia: supplement   8. Seizure prophylaxis: continue keppra bid.  9. Dizziness:   checking orthostatic BP's. May be symptomatic due to significant drop in BP.  Have set parameters on BP medicaitons.  10. Constipation :   bowel program.    LOS (Days) 2 A FACE TO FACE EVALUATION WAS PERFORMED  Luis Oconnor T 06/25/2012 8:12 AM

## 2012-06-25 NOTE — Progress Notes (Signed)
Speech Language Pathology Daily Session Note  Patient Details  Name: Luis Oconnor MRN: [id number] Date of Birth: 16-Apr-1955  Today's Date: 06/25/2012 Time: 1100-1135 Time Calculation (min): 35 min  Short Term Goals: Week 1: SLP Short Term Goal 1 (Week 1): Pt will identify 2 cognitive impairments with Mod A question and semantic cues.  SLP Short Term Goal 2 (Week 1): Pt will demonstrate selective attention during a functional and familiar task for 15 minutes with Mod A verbal cues for redirection. SLP Short Term Goal 3 (Week 1): Pt will demonstrate attention to left field of enviornment during functional and familiar tasks with mod A question and verbal cues.  SLP Short Term Goal 4 (Week 1): pt will demonstrate functional problem solving for functional and familiar tasks with Min A verbal cues.  SLP Short Term Goal 5 (Week 1): Pt will recall new, daily events with use of external memory aids with Min A verbal and question cues.   Skilled Therapeutic Interventions: Treatment focus on cognitive tasks. Pt lethargic and reports "a splitting headache." RN made aware and reported pt was pre-medicated. SLP facilitated session by providing Mod A verbal and question cues during a sorting task for functional problem solving, organization, left inattention and sustained attention to task. Pt also required multiple repetitions of directions to task due to decreased attention from internal distractions. Pt also refused lunch early tray. Treatment session ended early due to pt's severe headache which was impacting his ability to participate.    FIM:  Comprehension Comprehension Mode: Auditory Comprehension: 4-Understands basic 75 - 89% of the time/requires cueing 10 - 24% of the time Expression Expression: 4-Expresses basic 75 - 89% of the time/requires cueing 10 - 24% of the time. Needs helper to occlude trach/needs to repeat words. Social Interaction Social Interaction: 2-Interacts appropriately 25 -  49% of time - Needs frequent redirection. Problem Solving Problem Solving: 3-Solves basic 50 - 74% of the time/requires cueing 25 - 49% of the time Memory Memory: 3-Recognizes or recalls 50 - 74% of the time/requires cueing 25 - 49% of the time  Pain Pain Assessment Pain Assessment: 0-10 Pain Score: 10-Worst pain ever Pain Type: Acute pain Pain Location: Head Pain Orientation: Right Pain Descriptors: Headache Pain Onset: On-going Patients Stated Pain Goal: 2 Pain Intervention(s): RN made aware, pt pre-medicated   Therapy/Group: Individual Therapy  Daleysa Kristiansen 06/25/2012, 12:13 PM

## 2012-06-25 NOTE — Progress Notes (Signed)
Physical Therapy Session Note  Patient Details  Name: FAARIS ARIZPE MRN: [id number] Date of Birth: 1956-02-26  Today's Date: 06/25/2012 Time: 1305-1330 Time Calculation (min): 25 min  Short Term Goals: Week 1:  PT Short Term Goal 1 (Week 1): Pt will perform dynamic balance during functional task with min A PT Short Term Goal 2 (Week 1): Pt will gait in controlled environment with min A 100' PT Short Term Goal 3 (Week 1): Pt will demo emergent awareness during functional task with mod A  Skilled Therapeutic Interventions/Progress Updates:    Focused session on overall activity tolerance/endurance and gait on carpeted surface; propelled w/c with LE's (intermittent use of UEs) on unit with cues needed for attention to task. Min A with gait with target for pt to increase gait distance. Pt lethargic throughout session. Transferred back to bed at end of session with bed alarm on.  Therapy Documentation Precautions:  Precautions Precautions: Fall Precaution Comments: BP, orthostatic Restrictions Weight Bearing Restrictions: No    Pain: C/o headache - RN aware.  See FIM for current functional status  Therapy/Group: Individual Therapy  Karolee Stamps [facility] 06/25/2012, 2:00 PM

## 2012-06-26 ENCOUNTER — Inpatient Hospital Stay (HOSPITAL_COMMUNITY): Payer: Self-pay | Admitting: Speech Pathology

## 2012-06-26 ENCOUNTER — Inpatient Hospital Stay (HOSPITAL_COMMUNITY): Payer: Medicaid Other | Admitting: Physical Therapy

## 2012-06-26 ENCOUNTER — Encounter (HOSPITAL_COMMUNITY): Payer: Self-pay | Admitting: Occupational Therapy

## 2012-06-26 DIAGNOSIS — I10 Essential (primary) hypertension: Secondary | ICD-10-CM

## 2012-06-26 DIAGNOSIS — I619 Nontraumatic intracerebral hemorrhage, unspecified: Secondary | ICD-10-CM

## 2012-06-26 LAB — BASIC METABOLIC PANEL
CO2: 24 mEq/L (ref 19–32)
Calcium: 9 mg/dL (ref 8.4–10.5)
Chloride: 96 mEq/L (ref 96–112)
Creatinine, Ser: 1.07 mg/dL (ref 0.50–1.35)
Glucose, Bld: 106 mg/dL — ABNORMAL HIGH (ref 70–99)

## 2012-06-26 MED ORDER — ENALAPRIL MALEATE 10 MG PO TABS
10.0000 mg | ORAL_TABLET | Freq: Every day | ORAL | Status: DC
  Administered 2012-06-26 – 2012-06-27 (×2): 10 mg via ORAL
  Filled 2012-06-26 (×3): qty 1

## 2012-06-26 NOTE — Progress Notes (Signed)
Occupational Therapy Session Note  Patient Details  Name: Luis Oconnor MRN: [id number] Date of Birth: Nov 23, 1955  Today's Date: 06/26/2012 Time: 0730-0825 Time Calculation (min): 55 min   Skilled Therapeutic Interventions/Progress Updates:    1:1 self care retraining at sink level (pt's Choice). Focus on functional ambulation without UE support, sit to stands, standing balance, simple problem solving, visual attention to left to locate objects, spatial perceptual and how to self correct mistakes, initiation and termination of task, selective attention with task. Pt improved with ability to orient clothing and don but functionally when answering phone and trying to shave with razor cap on requiring mod A. Pt continues to be fatigue and reports he feels like his head is swimming.   Therapy Documentation Precautions:  Precautions Precautions: Fall Precaution Comments: BP, orthostatic Restrictions Weight Bearing Restrictions: No Pain:  c/o mild HA but able to participate   See FIM for current functional status  Therapy/Group: Individual Therapy  Roney Mans [facility] 06/26/2012, 8:26 AM

## 2012-06-26 NOTE — Progress Notes (Signed)
Physical Therapy Session Note  Patient Details  Name: Luis Oconnor MRN: [id number] Date of Birth: 11/03/1955  Today's Date: 06/26/2012 Time: 1430-1500 Time Calculation (min): 30 min  Short Term Goals: Week 1:  PT Short Term Goal 1 (Week 1): Pt will perform dynamic balance during functional task with min A PT Short Term Goal 2 (Week 1): Pt will gait in controlled environment with min A 100' PT Short Term Goal 3 (Week 1): Pt will demo emergent awareness during functional task with mod A  Skilled Therapeutic Interventions/Progress Updates:   Patient reporting significant HA and had not eaten lunch yet.  Allowed patient 30 minutes to eat lunch and receive pain medication for HA.  Patient participated in gait group with focus on dynamic standing balance with alternating LE hip flexion marches with UE support for single limb stance training, LE strengthening with 10 reps sit <> stand with minimal UE use, car transfer training with min A, stair negotiation training with bilat rails and supervision-min A and gait training with HHA and verbal cues for increased step/stride length and stance time.    Therapy Documentation Precautions:  Precautions Precautions: Fall Precaution Comments: BP, orthostatic Restrictions Weight Bearing Restrictions: No General: Amount of Missed PT Time (min): 30 Minutes Missed Time Reason: Pain Vital Signs: Therapy Vitals Temp: 98.2 F (36.8 C) Temp src: Oral Pulse Rate: 57 Resp: 16 BP: 104/51 mmHg Patient Position, if appropriate: Sitting Oxygen Therapy SpO2: 100 % Pain: Pain Assessment Pain Score: 10-Worst pain ever Pain Type: Acute pain Pain Location: Head Pain Descriptors: Headache Pain Frequency: Constant Pain Intervention(s): Medication (See eMAR)  See FIM for current functional status  Therapy/Group: Group Therapy  Edman Circle Faucette 06/26/2012, 5:18 PM

## 2012-06-26 NOTE — Progress Notes (Signed)
Speech Language Pathology Daily Session Note  Patient Details  Name: Luis Oconnor MRN: [id number] Date of Birth: 1955-09-27  Today's Date: 06/26/2012 Time: 2130-8657 Time Calculation (min): 45 min  Short Term Goals: Week 1: SLP Short Term Goal 1 (Week 1): Pt will identify 2 cognitive impairments with Mod A question and semantic cues.  SLP Short Term Goal 1 - Progress (Week 1): Progressing toward goal SLP Short Term Goal 2 (Week 1): Pt will demonstrate selective attention during a functional and familiar task for 15 minutes with Mod A verbal cues for redirection. SLP Short Term Goal 2 - Progress (Week 1): Progressing toward goal SLP Short Term Goal 3 (Week 1): Pt will demonstrate attention to left field of enviornment during functional and familiar tasks with mod A question and verbal cues.  SLP Short Term Goal 3 - Progress (Week 1): Progressing toward goal SLP Short Term Goal 4 (Week 1): pt will demonstrate functional problem solving for functional and familiar tasks with Min A verbal cues.  SLP Short Term Goal 4 - Progress (Week 1): Progressing toward goal SLP Short Term Goal 5 (Week 1): Pt will recall new, daily events with use of external memory aids with Min A verbal and question cues.   Skilled Therapeutic Interventions: Treatment focused on facilitation of cognitive-linguistic recovery. Moderate-max verbal and visual cueing provided for attention to left visual field during a functional ADL. Supervision cueing provided for selective attention to self feeding task for 15 minutes. Functional problem solving required moderate cueing. Patient participated in reading and writing task in which SLP provided moderate-max cues to facilitate appropriate visual spacial abilites for functionality. Overall, patient making steady progress with functional goals.    FIM:  Comprehension Comprehension Mode: Auditory Comprehension: 5-Follows basic conversation/direction: With no  assist Expression Expression Mode: Verbal Expression: 5-Expresses basic 90% of the time/requires cueing < 10% of the time. Social Interaction Social Interaction: 3-Interacts appropriately 50 - 74% of the time - May be physically or verbally inappropriate. Problem Solving Problem Solving: 4-Solves basic 75 - 89% of the time/requires cueing 10 - 24% of the time Memory Memory: 3-Recognizes or recalls 50 - 74% of the time/requires cueing 25 - 49% of the time  Pain Pain Assessment Pain Assessment: 0-10 Pain Score:   5 Pain Type: Acute pain Pain Location: Head Pain Orientation: Right Pain Descriptors: Aching Pain Frequency: Constant Pain Onset: On-going Patients Stated Pain Goal: 3 Pain Intervention(s): RN made aware  Therapy/Group: Individual Therapy  Ferdinand Lango MA, CCC-SLP [phone]   Ferdinand Lango Meryl 06/26/2012, 9:28 AM

## 2012-06-26 NOTE — Progress Notes (Signed)
Subjective/Complaints: Very alert this am. Occasional headache but tolerable. A 12 point review of systems has been performed and if not noted above is otherwise negative.   Objective: Vital Signs: Blood pressure 156/98, pulse 54, temperature 98.8 F (37.1 C), temperature source Oral, resp. rate 18, height 5\' 6"  (1.676 m), weight 62.9 kg (138 lb 10.7 oz), SpO2 100.00%. No results found.  Recent Labs  06/24/12 0535  WBC 6.0  HGB 15.8  HCT 43.1  PLT 287    Recent Labs  06/25/12 0555 06/26/12 0610  NA 123* 128*  K 4.5 4.9  CL 89* 96  GLUCOSE 111* 106*  BUN 25* 23  CREATININE 1.12 1.07  CALCIUM 9.2 9.0   CBG (last 3)  No results found for this basename: GLUCAP,  in the last 72 hours  Wt Readings from Last 3 Encounters:  06/24/12 62.9 kg (138 lb 10.7 oz)  06/20/12 66.5 kg (146 lb 9.7 oz)  06/20/12 66.5 kg (146 lb 9.7 oz)    Physical Exam:  Constitutional: He appears well-nourished.   HENT:  Head: Normocephalic and atraumatic.  Right Ear: External ear normal.  Left Ear: External ear normal.  Nose: Nose normal.  Mouth/Throat: Oropharynx is clear and moist.  Right crani incision with staples in place. Wound well approximated Eyes: Conjunctivae are normal. Pupils are equal, round, and reactive to light. Right eye exhibits no discharge. Left eye exhibits discharge.  Neck: Normal range of motion. No JVD present. No tracheal deviation present. No thyromegaly present.  Cardiovascular: Normal rate and regular rhythm. Exam reveals no gallop and no friction rub.  No murmur heard.  Pulmonary/Chest: Effort normal. No respiratory distress. He has no wheezes. He has no rales. He exhibits no tenderness.  Abdominal: Soft. Bowel sounds are normal. He exhibits no distension. There is no tenderness. There is no rebound.  Musculoskeletal: He exhibits no edema.  Lymphadenopathy:  He has cervical adenopathy.  Neurological:    Awakened easily. Oriented to place, name, month.  Followed  all simple commands. Moved all 4's with 3+ to 4/5 strength. Sensed pain in all 4 limbs. Cn exam non-focal this am.   Psychiatric:  Generally  alert and appropriate.  Assessment/Plan: 1. 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 Physiatrist is providing close team supervision and 24 hour management of active medical problems listed below. Physiatrist and rehab team continue to assess barriers to discharge/monitor patient progress toward functional and medical goals. FIM: FIM - Bathing Bathing Steps Patient Completed: Chest;Right Arm;Left Arm;Abdomen;Left upper leg;Right upper leg;Buttocks;Front perineal area;Right lower leg (including foot);Left lower leg (including foot) Bathing: 4: Steadying assist  FIM - Upper Body Dressing/Undressing Upper body dressing/undressing steps patient completed: Thread/unthread left sleeve of pullover shirt/dress;Put head through opening of pull over shirt/dress;Pull shirt over trunk;Thread/unthread right sleeve of pullover shirt/dresss Upper body dressing/undressing: 4: Min-Patient completed 75 plus % of tasks FIM - Lower Body Dressing/Undressing Lower body dressing/undressing steps patient completed: Thread/unthread left underwear leg;Pull underwear up/down;Thread/unthread right pants leg;Thread/unthread left pants leg;Don/Doff left sock;Don/Doff right shoe;Don/Doff left shoe;Don/Doff right sock;Fasten/unfasten right shoe;Thread/unthread right underwear leg;Pull pants up/down Lower body dressing/undressing: 4: Min-Patient completed 75 plus % of tasks  FIM - Toileting Toileting steps completed by patient: Adjust clothing prior to toileting;Performs perineal hygiene Toileting: 3: Mod-Patient completed 2 of 3 steps  FIM - Archivist Transfers: 4-To toilet/BSC: Min A (steadying Pt. > 75%);4-From toilet/BSC: Min A (steadying Pt. >  75%)  FIM - Bed/Chair Transfer Bed/Chair Transfer: 4: Chair or W/C > Bed: Min A (steadying Pt. > 75%);4: Bed > Chair or W/C: Min A (steadying Pt. > 75%)  FIM - Locomotion: Wheelchair Distance: 100' Locomotion: Wheelchair: 2: Travels 50 - 149 ft with supervision, cueing or coaxing FIM - Locomotion: Ambulation Ambulation/Gait Assistance: 3: Mod assist Locomotion: Ambulation: 2: Travels 50 - 149 ft with minimal assistance (Pt.>75%)  Comprehension Comprehension Mode: Auditory Comprehension: 4-Understands basic 75 - 89% of the time/requires cueing 10 - 24% of the time  Expression Expression Mode: Verbal Expression: 4-Expresses basic 75 - 89% of the time/requires cueing 10 - 24% of the time. Needs helper to occlude trach/needs to repeat words.  Social Interaction Social Interaction: 2-Interacts appropriately 25 - 49% of time - Needs frequent redirection.  Problem Solving Problem Solving: 3-Solves basic 50 - 74% of the time/requires cueing 25 - 49% of the time  Memory Memory: 3-Recognizes or recalls 50 - 74% of the time/requires cueing 25 - 49% of the time Medical Problem List and Plan:  1. DVT Prophylaxis/Anticoagulation: Mechanical: Sequential compression devices, below knee Bilateral lower extremities  2. Pain Management: monitor for now. rx pain but want to limit neurosedating meds as possible.  3. 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 Continue to follow  4. Neuropsych: This patient is not capable of making decisions on his/her own behalf.   -continue ritalin for stimulation/attention 5. 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 BP now down to 110 range. Increase Vasotec, continue norvasc, Labetalol -diuretics dc'ed due to hyponatremia.    6. Hyponatremia: diuretics stopped. Na+ 128 today. Will continue IV NS tonight then dc. Recheck monday 7. Hypokalemia: supplement   8. Seizure prophylaxis: continue keppra bid.  9. Dizziness:   checking  orthostatic BP's. May be symptomatic due to significant drop in BP.  Have set parameters on BP medicaitons.  10. Constipation :   bowel program.    LOS (Days) 3 A FACE TO FACE EVALUATION WAS PERFORMED  Luis Oconnor T 06/26/2012 8:08 AM

## 2012-06-26 NOTE — Progress Notes (Signed)
Physical Therapy Note  Patient Details  Name: GLENDON DUNWOODY MRN: [id number] Date of Birth: 10-Jun-1955 Today's Date: 06/26/2012  Time: 1030-1056 26 minutes  Pt c/o headache, RN aware, pt medicated prior to session.  Pt continues with lethargy and pain limiting progress with functional mobility, gait and balance.  Treatment session focused on improving activity tolerance and LE strength.  Gait training 3 minutes x 3 with min-mod A, increased assist when fatigued.  Standing therex and sit to stand training with short rest breaks to promote activity tolerance, pt limited by lethargy throughout.  Individual therapy   Jovonna Nickell 06/26/2012, 10:56 AM

## 2012-06-27 ENCOUNTER — Encounter (HOSPITAL_COMMUNITY): Payer: Self-pay | Admitting: Occupational Therapy

## 2012-06-27 ENCOUNTER — Inpatient Hospital Stay (HOSPITAL_COMMUNITY): Payer: Self-pay | Admitting: Physical Therapy

## 2012-06-27 ENCOUNTER — Inpatient Hospital Stay (HOSPITAL_COMMUNITY): Payer: Medicaid Other | Admitting: *Deleted

## 2012-06-27 ENCOUNTER — Inpatient Hospital Stay (HOSPITAL_COMMUNITY): Payer: Medicaid Other | Admitting: Speech Pathology

## 2012-06-27 DIAGNOSIS — I619 Nontraumatic intracerebral hemorrhage, unspecified: Secondary | ICD-10-CM

## 2012-06-27 DIAGNOSIS — I10 Essential (primary) hypertension: Secondary | ICD-10-CM

## 2012-06-27 DIAGNOSIS — R51 Headache: Secondary | ICD-10-CM

## 2012-06-27 MED ORDER — ENALAPRIL MALEATE 10 MG PO TABS
10.0000 mg | ORAL_TABLET | Freq: Two times a day (BID) | ORAL | Status: DC
  Administered 2012-06-27 – 2012-07-04 (×14): 10 mg via ORAL
  Filled 2012-06-27 (×16): qty 1

## 2012-06-27 NOTE — Progress Notes (Signed)
Physical Therapy Note  Patient Details  Name: Luis Oconnor MRN: [id number] Date of Birth: Jan 01, 1956 Today's Date: 06/27/2012  1300-1355 (55 minutes) group Pain: no reported pain Pt participated in PT group session focused on gait training/safety/endurance. Pt ambulates 120 feet X 3 with min assist at waist/ decreased step length bilaterally and vcs for direction finding; Nustep Level 4 X 10 minutes for activity tolerance.    Kj Imbert,JIM 06/27/2012, 1:47 PM

## 2012-06-27 NOTE — Progress Notes (Signed)
Speech Language Pathology Daily Session Note  Patient Details  Name: SKEETER SHEARD MRN: [id number] Date of Birth: 1955/09/28  Today's Date: 06/27/2012 Time: 4098-1191 Time Calculation (min): 20 min  Short Term Goals: Week 1: SLP Short Term Goal 1 (Week 1): Pt will identify 2 cognitive impairments with Mod A question and semantic cues.  SLP Short Term Goal 1 - Progress (Week 1): Progressing toward goal SLP Short Term Goal 2 (Week 1): Pt will demonstrate selective attention during a functional and familiar task for 15 minutes with Mod A verbal cues for redirection. SLP Short Term Goal 2 - Progress (Week 1): Progressing toward goal SLP Short Term Goal 3 (Week 1): Pt will demonstrate attention to left field of enviornment during functional and familiar tasks with mod A question and verbal cues.  SLP Short Term Goal 3 - Progress (Week 1): Progressing toward goal SLP Short Term Goal 4 (Week 1): pt will demonstrate functional problem solving for functional and familiar tasks with Min A verbal cues.  SLP Short Term Goal 4 - Progress (Week 1): Progressing toward goal SLP Short Term Goal 5 (Week 1): Pt will recall new, daily events with use of external memory aids with Min A verbal and question cues.   Skilled Therapeutic Interventions: Patient seen to address cognitive-lingusitic goals, however session was limited due to patient's fatigue and c/o "I feel woozy". Patient declined any of his lunch, and responded to SLP's questions accurately in regards to tasks/activities completed today, however session ended early secondary to patient unable to maintain attention and alertness.   FIM:  Comprehension Comprehension Mode: Auditory Comprehension: 5-Follows basic conversation/direction: With extra time/assistive device Expression Expression Mode: Verbal Expression: 5-Expresses basic needs/ideas: With extra time/assistive device Social Interaction Social Interaction: 4-Interacts appropriately 75 -  89% of the time - Needs redirection for appropriate language or to initiate interaction. Problem Solving Problem Solving: 4-Solves basic 75 - 89% of the time/requires cueing 10 - 24% of the time Memory Memory: 3-Recognizes or recalls 50 - 74% of the time/requires cueing 25 - 49% of the time FIM - Eating Eating Activity: 5: Set-up assist for open containers  Pain    Therapy/Group: Individual Therapy  Pablo Lawrence 06/27/2012, 4:21 PM  Angela Nevin, MA, CCC-SLP [facility] Speech-Language Pathologist

## 2012-06-27 NOTE — Progress Notes (Addendum)
Luis Oconnor is a 57 y.o. male 1956/03/10 [id number]  Subjective: No new complaints, except for some HAs. No new problems. Slept well. Feeling OK.  Objective: Vital signs in last 24 hours: Temp:  [98.2 F (36.8 C)-98.6 F (37 C)] 98.6 F (37 C) (03/29 0700) Pulse Rate:  [56-91] 56 (03/29 0700) Resp:  [16-18] 18 (03/29 0700) BP: (104-174)/(51-95) 174/94 mmHg (03/29 0700) SpO2:  [98 %-100 %] 98 % (03/29 0700) Weight change:  Last BM Date: 06/25/12  Intake/Output from previous day: 03/28 0701 - 03/29 0700 In: 360 [P.O.:360] Out: 300 [Urine:300] Last cbgs: CBG (last 3)  No results found for this basename: GLUCAP,  in the last 72 hours   Physical Exam General: No apparent distress    HEENT: moist mucosa Lungs: Normal effort. Lungs clear to auscultation, no crackles or wheezes. Cardiovascular: Regular rate and rhythm, no edema Musculoskeletal:  No change from before Neurological: No new neurological deficits Wounds: N/A    Skin: clear Alert, cooperative   Lab Results: BMET    Component Value Date/Time   NA 128* 06/26/2012 0610   K 4.9 06/26/2012 0610   CL 96 06/26/2012 0610   CO2 24 06/26/2012 0610   GLUCOSE 106* 06/26/2012 0610   BUN 23 06/26/2012 0610   CREATININE 1.07 06/26/2012 0610   CALCIUM 9.0 06/26/2012 0610   GFRNONAA 74* 06/26/2012 0610   GFRAA 86* 06/26/2012 0610   CBC    Component Value Date/Time   WBC 6.0 06/24/2012 0535   RBC 5.45 06/24/2012 0535   HGB 15.8 06/24/2012 0535   HCT 43.1 06/24/2012 0535   PLT 287 06/24/2012 0535   MCV 79.1 06/24/2012 0535   MCH 29.0 06/24/2012 0535   MCHC 36.7* 06/24/2012 0535   RDW 12.0 06/24/2012 0535   LYMPHSABS 1.1 06/24/2012 0535   MONOABS 0.9 06/24/2012 0535   EOSABS 0.3 06/24/2012 0535   BASOSABS 0.1 06/24/2012 0535    Studies/Results: No results found.  Medications: I have reviewed the patient's current medications.  Assessment/Plan:   1. DVT Prophylaxis/Anticoagulation: Mechanical: Sequential compression  devices, below knee Bilateral lower extremities  2. Pain Management: monitor for now. rx pain but want to limit neurosedating meds as possible.  3. 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 Continue to follow  4. Neuropsych: This patient is not capable of making decisions on his/her own behalf.  -continue ritalin for stimulation/attention  5. 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 BP now down to 110 range. Increase Vasotec, continue norvasc, Labetalol -diuretics dc'ed due to hyponatremia. Re-eval tomorrow 6. Hyponatremia: diuretics stopped. Na+ is better. Will continue IV NS tonight then dc. Recheck monday  7. Hypokalemia: supplement  8. Seizure prophylaxis: continue keppra bid.  9. Dizziness: checking orthostatic BP's. May be symptomatic due to significant drop in BP. Have set parameters on BP medicaitons.  10. Constipation : bowel program.  11. HAs. Will watch    Length of stay, days: 4  Sonda Primes , MD 06/27/2012, 10:06 AM

## 2012-06-27 NOTE — Progress Notes (Signed)
Physical Therapy Note  Patient Details  Name: Luis Oconnor MRN: [id number] Date of Birth: Nov 18, 1955 Today's Date: 06/27/2012  Time: 1015-1057 42 minutes  Pt continues to c/o headache 8/10, RN aware.  Treatment session focused on building activity tolerance and LE strength through gait training multiple attempts of 100' to 150' with short rest breaks to improve endurance.  Pt continues to require min A for balance during gait, tends to have knees buckle when fatigued requiring min-mod A to correct.  Standing therex with mod cuing for attention and technique.  Stair negotiation for LE strength and endurance 3 x 6 stairs min A to close supervision with B handrails.  Pt continues to be limited by extreme lethargy throughout sessions limiting functional gains.  Individual therapy   Reta Norgren 06/27/2012, 10:57 AM

## 2012-06-27 NOTE — Progress Notes (Signed)
Occupational Therapy Session Note  Patient Details  Name: Luis Oconnor MRN: [id number] Date of Birth: Jun 13, 1955  Today's Date: 06/27/2012 Time: 0800-0855 Time Calculation (min): 55 min   Skilled Therapeutic Interventions/Progress Updates:    1:1 self care retraining at shower level. Pt in bed and required more than reasonable time to get out of bed and go into the bathroom. Pt required cuing for each step of process to take of each piece of clothing and transfer into shower. Pt kept asking "whats next," prompted with questioning cues. Pt slow in all tasks. Place clothing on bed, to his left when dressing- pt needed cuing for each piece of clothing to recall where they were set out (on his left). Therapist sitting on bed next to clothing, pt thought her pants were his to don. Pt continues to require mod A to initiate all task with tactile cuing.  Therapy Documentation Precautions:  Precautions Precautions: Fall Precaution Comments: BP, orthostatic Restrictions Weight Bearing Restrictions: No Pain:  c/o 10/10 RN aware  See FIM for current functional status  Therapy/Group: Individual Therapy  Roney Mans [facility] 06/27/2012, 8:29 AM

## 2012-06-28 ENCOUNTER — Ambulatory Visit (HOSPITAL_COMMUNITY): Payer: Self-pay | Admitting: *Deleted

## 2012-06-28 NOTE — Plan of Care (Signed)
Problem: RH BLADDER ELIMINATION Goal: RH STG MANAGE BLADDER WITH ASSISTANCE STG Manage Bladder With Min Assistance  Outcome: Progressing Assist to stand at side of bed and use urinal

## 2012-06-28 NOTE — Progress Notes (Signed)
At 2121 B/P=167/112, verified manually.Patient complained of headache, PRN tylenol given. Paged Dr. Cato Mulligan at 2130 and order rec'd. At 2153 Vasotec given. At 0016 B/P=153/95. Patient with constant complaint of headache. Crani incision with staples-C, D & I. PRN tylenol also given at 0521. And PRN  oxy IR given at 0521. Luis Oconnor A

## 2012-06-28 NOTE — Progress Notes (Signed)
Physical Therapy Note  Patient Details  Name: Luis Oconnor MRN: [id number] Date of Birth: 1955-05-18 Today's Date: 06/28/2012  1300 Pt missed 60 minute PT group session (gait group) secondary to refusal ("not feeling well").  [name],JIM 06/28/2012, 1:51 PM

## 2012-06-28 NOTE — Plan of Care (Signed)
Problem: RH PAIN MANAGEMENT Goal: RH STG PAIN MANAGED AT OR BELOW PT'S PAIN GOAL Less than or equal to 3.  Outcome: Not Progressing Pt consistently complains of headache rated >7/10, tylenol and Oxy IR given as ordered, fentanyl patch in place

## 2012-06-28 NOTE — Progress Notes (Signed)
Luis Oconnor is a 57 y.o. male [birth year] [id number]  Subjective: No new complaints. No HAs. No new problems. Slept well. Feeling OK.  Objective: Vital signs in last 24 hours: Temp:  [98.4 F (36.9 C)-98.8 F (37.1 C)] 98.4 F (36.9 C) (03/30 0455) Pulse Rate:  [55-66] 60 (03/30 0500) Resp:  [18] 18 (03/30 0455) BP: (143-167)/(82-112) 153/87 mmHg (03/30 0500) SpO2:  [97 %-98 %] 97 % (03/30 0455) Weight change:  Last BM Date: 06/25/12  Intake/Output from previous day: 03/29 0701 - 03/30 0700 In: 600 [P.O.:600] Out: 1225 [Urine:1225] Last cbgs: CBG (last 3)  No results found for this basename: GLUCAP,  in the last 72 hours   Physical Exam General: No apparent distress    HEENT: moist mucosa Lungs: Normal effort. Lungs clear to auscultation, no crackles or wheezes. Cardiovascular: Regular rate and rhythm, no edema Musculoskeletal:  No change from before Neurological: No new neurological deficits Wounds: N/A    Skin: clear Alert, cooperative   Lab Results: BMET    Component Value Date/Time   NA 128* 06/26/2012 0610   K 4.9 06/26/2012 0610   CL 96 06/26/2012 0610   CO2 24 06/26/2012 0610   GLUCOSE 106* 06/26/2012 0610   BUN 23 06/26/2012 0610   CREATININE 1.07 06/26/2012 0610   CALCIUM 9.0 06/26/2012 0610   GFRNONAA 74* 06/26/2012 0610   GFRAA 86* 06/26/2012 0610   CBC    Component Value Date/Time   WBC 6.0 06/24/2012 0535   RBC 5.45 06/24/2012 0535   HGB 15.8 06/24/2012 0535   HCT 43.1 06/24/2012 0535   PLT 287 06/24/2012 0535   MCV 79.1 06/24/2012 0535   MCH 29.0 06/24/2012 0535   MCHC 36.7* 06/24/2012 0535   RDW 12.0 06/24/2012 0535   LYMPHSABS 1.1 06/24/2012 0535   MONOABS 0.9 06/24/2012 0535   EOSABS 0.3 06/24/2012 0535   BASOSABS 0.1 06/24/2012 0535    Studies/Results: No results found.  Medications: I have reviewed the patient's current medications.  Assessment/Plan:   1. DVT Prophylaxis/Anticoagulation: Mechanical: Sequential compression devices, below  knee Bilateral lower extremities  2. Pain Management: monitor for now. rx pain but want to limit neurosedating meds as possible.  3. 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 Continue to follow  4. Neuropsych: This patient is not capable of making decisions on his/her own behalf.  -continue ritalin for stimulation/attention  5. 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 BP now down to 110 range. Increase Vasotec, continue norvasc, Labetalol -diuretics dc'ed due to hyponatremia. Re-eval tomorrow 6. Hyponatremia: diuretics stopped. Na+ is better. Will continue IV NS tonight then dc. Recheck monday  7. Hypokalemia: supplement  8. Seizure prophylaxis: continue keppra bid.  9. Dizziness: checking orthostatic BP's. May be symptomatic due to significant drop in BP. Have set parameters on BP medicaitons.  10. Constipation : bowel program.  11. HAs. Will watch - better    Length of stay, days: 5  Sonda Primes , MD 06/28/2012, 9:49 AM

## 2012-06-28 NOTE — Plan of Care (Signed)
Problem: RH BOWEL ELIMINATION Goal: RH STG MANAGE BOWEL W/MEDICATION W/ASSISTANCE STG Manage Bowel with Medication with Min Assistance.  Outcome: Progressing Receives miralax BID

## 2012-06-29 ENCOUNTER — Inpatient Hospital Stay (HOSPITAL_COMMUNITY): Payer: Self-pay | Admitting: Occupational Therapy

## 2012-06-29 ENCOUNTER — Inpatient Hospital Stay (HOSPITAL_COMMUNITY): Payer: Medicaid Other | Admitting: Speech Pathology

## 2012-06-29 ENCOUNTER — Encounter (HOSPITAL_COMMUNITY): Payer: Self-pay | Admitting: Occupational Therapy

## 2012-06-29 ENCOUNTER — Inpatient Hospital Stay (HOSPITAL_COMMUNITY): Payer: Medicaid Other | Admitting: Physical Therapy

## 2012-06-29 DIAGNOSIS — I619 Nontraumatic intracerebral hemorrhage, unspecified: Secondary | ICD-10-CM

## 2012-06-29 DIAGNOSIS — I10 Essential (primary) hypertension: Secondary | ICD-10-CM

## 2012-06-29 LAB — BASIC METABOLIC PANEL
BUN: 21 mg/dL (ref 6–23)
CO2: 26 mEq/L (ref 19–32)
Calcium: 9.1 mg/dL (ref 8.4–10.5)
GFR calc non Af Amer: >90 mL/min (ref >90)
Glucose, Bld: 111 mg/dL — ABNORMAL HIGH (ref 70–99)
Potassium: 4.8 mEq/L (ref 3.5–5.1)

## 2012-06-29 MED ORDER — METHYLPHENIDATE HCL 5 MG PO TABS
5.0000 mg | ORAL_TABLET | Freq: Two times a day (BID) | ORAL | Status: DC
  Administered 2012-06-29 – 2012-07-02 (×7): 5 mg via ORAL
  Filled 2012-06-29 (×7): qty 1

## 2012-06-29 NOTE — Progress Notes (Signed)
Subjective/Complaints: Intermittent headaches. Medication controls them A 12 point review of systems has been performed and if not noted above is otherwise negative.   Objective: Vital Signs: Blood pressure 141/89, pulse 58, temperature 98.2 F (36.8 C), temperature source Oral, resp. rate 19, height 5\' 6"  (1.676 m), weight 62.9 kg (138 lb 10.7 oz), SpO2 98.00%. No results found. No results found for this basename: WBC, HGB, HCT, PLT,  in the last 72 hours No results found for this basename: NA, K, CL, CO, GLUCOSE, BUN, CREATININE, CALCIUM,  in the last 72 hours CBG (last 3)  No results found for this basename: GLUCAP,  in the last 72 hours  Wt Readings from Last 3 Encounters:  06/24/12 62.9 kg (138 lb 10.7 oz)  06/20/12 66.5 kg (146 lb 9.7 oz)  06/20/12 66.5 kg (146 lb 9.7 oz)    Physical Exam:  Constitutional: He appears well-nourished.   HENT:  Head: Normocephalic and atraumatic.  Right Ear: External ear normal.  Left Ear: External ear normal.  Nose: Nose normal.  Mouth/Throat: Oropharynx is clear and moist.  Right crani incision intact.  Eyes: Conjunctivae are normal. Pupils are equal, round, and reactive to light. Right eye exhibits no discharge. Left eye exhibits no discharge.  Neck: Normal range of motion. No JVD present. No tracheal deviation present. No thyromegaly present.  Cardiovascular: Normal rate and regular rhythm. Exam reveals no gallop and no friction rub.  No murmur heard.  Pulmonary/Chest: Effort normal. No respiratory distress. He has no wheezes. He has no rales. He exhibits no tenderness.  Abdominal: Soft. Bowel sounds are normal. He exhibits no distension. There is no tenderness. There is no rebound.  Musculoskeletal: He exhibits no edema.  Lymphadenopathy: none  Neurological:    Alert and oriented. Appropriate conversation.  Moved all 4's with 3+ to 4/5 strength. Sensed pain in all 4 limbs. Cn exam non-focal this am.   Psychiatric:  Generally  alert  and appropriate.  Assessment/Plan: 1. 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 Physiatrist is providing close team supervision and 24 hour management of active medical problems listed below. Physiatrist and rehab team continue to assess barriers to discharge/monitor patient progress toward functional and medical goals. FIM: FIM - Bathing Bathing Steps Patient Completed: Chest;Right Arm;Right lower leg (including foot);Left lower leg (including foot);Left Arm;Abdomen;Front perineal area;Buttocks;Left upper leg;Right upper leg Bathing: 0: Activity did not occur  FIM - Upper Body Dressing/Undressing Upper body dressing/undressing steps patient completed: Put head through opening of pull over shirt/dress;Thread/unthread left sleeve of pullover shirt/dress;Thread/unthread right sleeve of pullover shirt/dresss;Pull shirt over trunk Upper body dressing/undressing: 0: Activity did not occur FIM - Lower Body Dressing/Undressing Lower body dressing/undressing steps patient completed: Thread/unthread right underwear leg;Thread/unthread left underwear leg;Pull underwear up/down;Thread/unthread right pants leg;Thread/unthread left pants leg;Pull pants up/down;Don/Doff right sock;Don/Doff left sock;Don/Doff right shoe;Don/Doff left shoe Lower body dressing/undressing: 0: Activity did not occur  FIM - Toileting Toileting steps completed by patient: Adjust clothing prior to toileting;Performs perineal hygiene;Adjust clothing after toileting Toileting: 4: Assist with fasteners  FIM - Toilet Transfers Toilet Transfers: 4-From toilet/BSC: Min A (steadying Pt. > 75%);4-To toilet/BSC: Min A (steadying Pt. > 75%)  FIM - Bed/Chair Transfer Bed/Chair Transfer: 4: Bed > Chair or W/C: Min A (steadying Pt. > 75%);4: Chair or W/C > Bed: Min A (steadying Pt. > 75%)  FIM - Locomotion:  Wheelchair Distance: 100' Locomotion: Wheelchair: 2: Travels 50 -  149 ft with supervision, cueing or coaxing FIM - Locomotion: Ambulation Ambulation/Gait Assistance: 3: Mod assist Locomotion: Ambulation: 2: Travels 50 - 149 ft with minimal assistance (Pt.>75%)  Comprehension Comprehension Mode: Auditory Comprehension: 5-Follows basic conversation/direction: With extra time/assistive device  Expression Expression Mode: Verbal Expression: 5-Expresses basic needs/ideas: With extra time/assistive device  Social Interaction Social Interaction: 4-Interacts appropriately 75 - 89% of the time - Needs redirection for appropriate language or to initiate interaction.  Problem Solving Problem Solving: 4-Solves basic 75 - 89% of the time/requires cueing 10 - 24% of the time  Memory Memory: 3-Recognizes or recalls 50 - 74% of the time/requires cueing 25 - 49% of the time Medical Problem List and Plan:  1. DVT Prophylaxis/Anticoagulation: Mechanical: Sequential compression devices, below knee Bilateral lower extremities  2. Pain Management: monitor for now. rx pain but want to limit neurosedating meds as possible.  3. 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 Continue to follow  4. Neuropsych: This patient is not capable of making decisions on his/her own behalf.   -continue ritalin for stimulation/attention--it has been helpful 5. 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 BP now down to 110 range. Increasd Vasotec, continue norvasc, Labetalol, -diuretics dc'ed due to hyponatremia.  -still having an occasional spike, may be correlated to when he's having a headache    6. Hyponatremia: diuretics stopped. Na+ 128 most recently. Labs pending today.  7. Hypokalemia: supplement   8. Seizure prophylaxis: continue keppra bid.  9. Dizziness:   ?orthostasis.   Have set parameters on BP medicaitons.  10. Constipation :   bowel program.    LOS (Days) 6 A FACE TO FACE  EVALUATION WAS PERFORMED  Luis Oconnor T 06/29/2012 8:05 AM

## 2012-06-29 NOTE — Progress Notes (Signed)
Physical Therapy Note  Patient Details  Name: Luis Oconnor MRN: [id number] Date of Birth: 1956/01/05 Today's Date: 06/29/2012  Time: 1000-1055 55 minutes  No c/o pain.  Balance training with step ups and tap ups to 4'' block with min A.  Pt still with R LE weakness > L.  Sit to stand training with focus on trunk control and standing posture with improvement with verbal and tactile cuing.  Gait training with stepping over and around obstacles with close supervision-min A for turning and backward stepping.  Gait training in controlled environment with close supervision, intermittent min A with head turns and direction changes.  Nu step for LE/UE strength and endurance x 8 minutes with cues to keep steps per minute > 60.  Pt more aroused today but continues to need frequent rest breaks due to lethargy.  Individual therapy   DONAWERTH,KAREN 06/29/2012, 10:55 AM

## 2012-06-29 NOTE — Progress Notes (Signed)
Occupational Therapy Session Note  Patient Details  Name: Luis Oconnor MRN: [id number] Date of Birth: 1955-05-03  Today's Date: 06/29/2012 Time: 0730-0830 Time Calculation (min): 60 min  Short Term Goals: Week 1:  OT Short Term Goal 1 (Week 1): Pt will initiate a functional task within 2 min OT Short Term Goal 2 (Week 1): Pt will locate items in left field with min questioning cues OT Short Term Goal 3 (Week 1): Pt will perform tub shower transfer with supervision OT Short Term Goal 4 (Week 1): Pt will demonstrate selective attention in a minimal distracting environment with min cuing  Skilled Therapeutic Interventions/Progress Updates:    1:1 self care retraining at shower level. Pt still had clothes on from Saturday when dressed with therapy and slept in shoes. Pt took 5-7 min with verbal and tactile cuing to initiate coming to EOB to proceed forward with morning routine. Pt required cuing throughout session for initiation and attention to the left (mod cuing). Pt able to orient clothing with improving only needing one cue for donning pants with questioning cue. Pt began to eat breakfast with fingers, fork on pt's left side- questioning cue for him to look for fork to use it. Functional ambulation around room performed with min guard to close supervision.   Therapy Documentation Precautions:  Precautions Precautions: Fall Precaution Comments: BP, orthostatic Restrictions Weight Bearing Restrictions: No Pain: Pain Assessment Pain Assessment: 0-10 Pain Score: 10-Worst pain ever Pain Type: Acute pain Pain Location: Head Pain Orientation: Right Pain Descriptors: Headache Pain Frequency: Intermittent Pain Onset: On-going Pain Intervention(s): Medication (See eMAR)  See FIM for current functional status  Therapy/Group: Individual Therapy  Roney Mans [facility] 06/29/2012, 8:18 AM

## 2012-06-29 NOTE — Progress Notes (Signed)
Occupational Therapy Session Note  Patient Details  Name: Luis Oconnor MRN: [id number] Date of Birth: [birth year]  Today's Date: 06/29/2012 Time: 1430-1500 Time Calculation (min): 30 min  Short Term Goals: Week 1:  OT Short Term Goal 1 (Week 1): Pt will initiate a functional task within 2 min OT Short Term Goal 2 (Week 1): Pt will locate items in left field with min questioning cues OT Short Term Goal 3 (Week 1): Pt will perform tub shower transfer with supervision OT Short Term Goal 4 (Week 1): Pt will demonstrate selective attention in a minimal distracting environment with min cuing  Skilled Therapeutic Interventions/Progress Updates:    1:1 when arrived pt laying in bed and when therapist entered the room pt reports he had to go to the bathroom. Pt didn't use call bell to call to use bathroom; had waited for someone to come into the room. Focus on attention to locate items on the left side of his environment with mod cuing to locate each item in his left field, Pt perseverated on performing hygiene in bathroom with decreased attention to how many times he had done this.   Therapy Documentation Precautions:  Precautions Precautions: Fall Precaution Comments: BP, orthostatic Restrictions Weight Bearing Restrictions: No Pain:  c/o bottom sore after going to the bathroom  See FIM for current functional status  Therapy/Group: Individual Therapy  Roney Mans [facility] 06/29/2012, 3:45 PM

## 2012-06-29 NOTE — Progress Notes (Signed)
Speech Language Pathology Daily Session Note  Patient Details  Name: Luis Oconnor MRN: [id number] Date of Birth: [birth year]  Today's Date: 06/29/2012 Time: 7829-5621 Time Calculation (min): 40 min  Short Term Goals: Week 1: SLP Short Term Goal 1 (Week 1): Pt will identify 2 cognitive impairments with Mod A question and semantic cues.  SLP Short Term Goal 1 - Progress (Week 1): Progressing toward goal SLP Short Term Goal 2 (Week 1): Pt will demonstrate selective attention during a functional and familiar task for 15 minutes with Mod A verbal cues for redirection. SLP Short Term Goal 2 - Progress (Week 1): Progressing toward goal SLP Short Term Goal 3 (Week 1): Pt will demonstrate attention to left field of enviornment during functional and familiar tasks with mod A question and verbal cues.  SLP Short Term Goal 3 - Progress (Week 1): Progressing toward goal SLP Short Term Goal 4 (Week 1): pt will demonstrate functional problem solving for functional and familiar tasks with Min A verbal cues.  SLP Short Term Goal 4 - Progress (Week 1): Progressing toward goal SLP Short Term Goal 5 (Week 1): Pt will recall new, daily events with use of external memory aids with Min A verbal and question cues.   Skilled Therapeutic Interventions: Treatment focus on cognitive goals. SLP facilitated session by providing Mod A verbal and visual cues for problem solving and left attention during a functional money management task. Pt also required Max A verbal, visual and question cues for functional problem solving with visual-spatial task. Pt demonstrated increased sustained attention to task and increased emergent awareness of cognitive deficits.    FIM:  Comprehension Comprehension Mode: Auditory Comprehension: 5-Follows basic conversation/direction: With extra time/assistive device Expression Expression Mode: Verbal Expression: 5-Expresses basic needs/ideas: With extra time/assistive device Social  Interaction Social Interaction: 3-Interacts appropriately 50 - 74% of the time - May be physically or verbally inappropriate. Problem Solving Problem Solving: 3-Solves basic 50 - 74% of the time/requires cueing 25 - 49% of the time Memory Memory: 3-Recognizes or recalls 50 - 74% of the time/requires cueing 25 - 49% of the time  Pain Reports pain in his head, unable to rate. Pt was premedicated   Therapy/Group: Individual Therapy  Chanell Nadeau 06/29/2012, 4:25 PM

## 2012-06-30 ENCOUNTER — Inpatient Hospital Stay (HOSPITAL_COMMUNITY): Payer: Medicaid Other | Admitting: Physical Therapy

## 2012-06-30 ENCOUNTER — Inpatient Hospital Stay (HOSPITAL_COMMUNITY): Payer: Medicaid Other | Admitting: Speech Pathology

## 2012-06-30 ENCOUNTER — Inpatient Hospital Stay (HOSPITAL_COMMUNITY): Payer: Medicaid Other

## 2012-06-30 DIAGNOSIS — I10 Essential (primary) hypertension: Secondary | ICD-10-CM

## 2012-06-30 DIAGNOSIS — I619 Nontraumatic intracerebral hemorrhage, unspecified: Secondary | ICD-10-CM

## 2012-06-30 MED ORDER — CARBAMAZEPINE ER 100 MG PO TB12
100.0000 mg | ORAL_TABLET | Freq: Two times a day (BID) | ORAL | Status: DC
  Administered 2012-06-30 – 2012-07-01 (×2): 100 mg via ORAL
  Filled 2012-06-30 (×4): qty 1

## 2012-06-30 NOTE — Progress Notes (Signed)
Physical Therapy Note  Patient Details  Name: Luis Oconnor MRN: [id number] Date of Birth: 07-17-1955 Today's Date: 06/30/2012  Time 1: 800-857 57 minutes  1:1 Pt reports no headache or pain this morning.  More alert, keeping eyes open and initiating conversation this morning.  Gait training without AD with min A with 1 LOB requiring mod-max A to correct.  Pt continues to report his legs "give out" while walking.  Gait training with RW with close supervision-min A, no LOB.  Pt reports he feels more secure with RW.  Obstacle negotiation and home environment gait with RW with close supervision, mod cuing for attention to L to avoid obstacles.  Scanning task with scavenger hunt using RW, pt requires mod cuing to locate objects, gait with close supervision.   Problem solving home obstacle negotiation requires max cuing for safety, no LOB.   Time 2: 1100-1140 40 minutes  1:1  No c/o pain.  Gait training with focus on education for curb negotiation with RW.  Pt performed multiple attempts with close supervision for sequencing and safety.  Standing balance training horseshoe toss without AD with supervision for static balance, min A for dynamic reaching and bending to retrieve horseshoes off of floor.  Pt with more lethargy this session, requires more frequent cues to safely perform mobility tasks.  Amora Sheehy 06/30/2012, 8:57 AM

## 2012-06-30 NOTE — Patient Care Conference (Signed)
Inpatient RehabilitationTeam Conference and Plan of Care Update Date: 06/30/2012   Time: 2:55 PM    Patient Name: Luis Oconnor      Medical Record Number: [id number]  Date of Birth: June 28, 1955 Sex: Male         Room/Bed: 4027/4027-01 Payor Info: Payor: MEDICAID PENDING  Plan: MEDICAID PENDING  Product Type: *No Product type*     Admitting Diagnosis: ICH  Admit Date/Time:  06/23/2012  6:23 PM Admission Comments: No comment available   Primary Diagnosis:  Cerebral parenchymal hemorrhage Principal Problem: Cerebral parenchymal hemorrhage  Patient Active Problem List   Diagnosis Date Noted  . Cerebral parenchymal hemorrhage 06/24/2012  . HTN (hypertension) 06/24/2012  . Hyponatremia 06/24/2012  . Headache 06/24/2012  . Fever 06/17/2012  . Malignant hypertension 06/15/2012    Expected Discharge Date: Expected Discharge Date: 07/04/12  Team Members Present: Physician leading conference: Dr. Faith Rogue Nurse Present: Carmie End, RN PT Present: Reggy Eye, PT OT Present: Mackie Pai, OT;Ardis Rowan, COTA SLP Present: Feliberto Gottron, SLP Other (Discipline and Name): Bayard Hugger, RN and Tora Duck, PPS Coordinator     Current Status/Progress Goal Weekly Team Focus  Medical   right frontal hemorrhage. poor initiation and attention  increase attention, monitor bp, electrolyte mgt, pain control  see above, pain control, increase attention   Bowel/Bladder   Continent of bowel and bladder; uses urinal; on Miralax BID  Remain continent of bowel and bladder until discharge  Continue to monitor for incontinence, constipation, and diarrhea   Swallow/Nutrition/ Hydration             ADL's   close supervision with mod -max cuing for attention to left, alertness, initiation  supervision  awareness, alertness pain management, abililty to self correct mistakes due to perception, attention to left and initiation with less cuing   Mobility   min A-supervision  supervision   family ed, alterness, awareness   Communication             Safety/Cognition/ Behavioral Observations  Mod-Max A  Min A  attention, left inattention, problem solving, initiation, perseveraiton, awareness    Pain   Complains of headache; PRN tylenol 650 mg and oxycodone 5-10mg   Reassess effectiveness of medication; keep pain level < 3; use tylenol before narcotics  Monitor and offer pain medicine q4h and as needed   Skin   Staples to R scalp, OTA, clean, dry, intact  Incision heals without infection; prevent skin breakdown  Monitor incision    Rehab Goals Patient on target to meet rehab goals: Yes *See Care Plan and progress notes for long and short-term goals.  Barriers to Discharge: poor attention, focus, decreased attention    Possible Resolutions to Barriers:  stimulants, pain control, family ed and supervision    Discharge Planning/Teaching Needs:  Home with girlfriend and sister/ family to provide 24/7 supervision      Team Discussion:  Making good gains and nearing supervision goals.  Ready to begin family education.  Still needs cues through most activities.  Revisions to Treatment Plan:  none   Continued Need for Acute Rehabilitation Level of Care: The patient requires daily medical management by a physician with specialized training in physical medicine and rehabilitation for the following conditions: Daily direction of a multidisciplinary physical rehabilitation program to ensure safe treatment while eliciting the highest outcome that is of practical value to the patient.: Yes Daily medical management of patient stability for increased activity during participation in an intensive rehabilitation regime.: Yes Daily  analysis of laboratory values and/or radiology reports with any subsequent need for medication adjustment of medical intervention for : Neurological problems;Post surgical problems;Other (bp, pain control)  Chantelle Verdi 06/30/2012, 5:00 PM

## 2012-06-30 NOTE — Progress Notes (Signed)
Occupational Therapy Note  Patient Details  Name: Luis Oconnor MRN: [id number] Date of Birth: February 04, 1956 Today's Date: 06/30/2012  Time 9:02-10am ( ) Pt seen for 1:1 session focused on self care retraining, transfers, task initiation and follow through. Pt sitting in recliner upon arrival, agreeable to shower and dress. CGA with RW into bathroom.  After toileting, pt showered with supervision and mod verbal cueing for sequencing and perseveration. Pt dressed at EOB and completed grooming standing at sink. Pt is scanning to L during activities, although required cueing about 25% of the time during session this morning. Ended session with pt sitting in recliner with quick release belt donned and call bell within reach. No pain reported by pt.  Luis Oconnor 06/30/2012, 9:18 AM

## 2012-06-30 NOTE — Progress Notes (Signed)
Subjective/Complaints: No new issues. Slept fairly well A 12 point review of systems has been performed and if not noted above is otherwise negative.   Objective: Vital Signs: Blood pressure 150/98, pulse 68, temperature 98.7 F (37.1 C), temperature source Oral, resp. rate 20, height 5\' 6"  (1.676 m), weight 62.9 kg (138 lb 10.7 oz), SpO2 99.00%. No results found. No results found for this basename: WBC, HGB, HCT, PLT,  in the last 72 hours  Recent Labs  06/29/12 0734  NA 131*  K 4.8  CL 97  GLUCOSE 111*  BUN 21  CREATININE 0.93  CALCIUM 9.1   CBG (last 3)  No results found for this basename: GLUCAP,  in the last 72 hours  Wt Readings from Last 3 Encounters:  06/24/12 62.9 kg (138 lb 10.7 oz)  06/20/12 66.5 kg (146 lb 9.7 oz)  06/20/12 66.5 kg (146 lb 9.7 oz)    Physical Exam:  Constitutional: He appears well-nourished.   HENT:  Head: Normocephalic and atraumatic.  Right Ear: External ear normal.  Left Ear: External ear normal.  Nose: Nose normal.  Mouth/Throat: Oropharynx is clear and moist.  Right crani incision intact.  Eyes: Conjunctivae are normal. Pupils are equal, round, and reactive to light. Right eye exhibits no discharge. Left eye exhibits no discharge.  Neck: Normal range of motion. No JVD present. No tracheal deviation present. No thyromegaly present.  Cardiovascular: Normal rate and regular rhythm. Exam reveals no gallop and no friction rub.  No murmur heard.  Pulmonary/Chest: Effort normal. No respiratory distress. He has no wheezes. He has no rales. He exhibits no tenderness.  Abdominal: Soft. Bowel sounds are normal. He exhibits no distension. There is no tenderness. There is no rebound.  Musculoskeletal: He exhibits no edema.  Lymphadenopathy: none  Neurological:    Alert and oriented. Appropriate conversation.  Moved all 4's with 3+ to 4/5 strength. Sensed pain in all 4 limbs.     Psychiatric:  Generally  alert and  appropriate.  Assessment/Plan: 1. 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 Physiatrist is providing close team supervision and 24 hour management of active medical problems listed below. Physiatrist and rehab team continue to assess barriers to discharge/monitor patient progress toward functional and medical goals. FIM: FIM - Bathing Bathing Steps Patient Completed: Chest;Right Arm;Right lower leg (including foot);Left lower leg (including foot);Left Arm;Abdomen;Front perineal area;Buttocks;Left upper leg;Right upper leg Bathing: 5: Set-up assist to: Adjust water temp  FIM - Upper Body Dressing/Undressing Upper body dressing/undressing steps patient completed: Put head through opening of pull over shirt/dress;Thread/unthread left sleeve of pullover shirt/dress;Thread/unthread right sleeve of pullover shirt/dresss;Pull shirt over trunk Upper body dressing/undressing: 5: Supervision: Safety issues/verbal cues FIM - Lower Body Dressing/Undressing Lower body dressing/undressing steps patient completed: Thread/unthread right underwear leg;Thread/unthread left underwear leg;Pull underwear up/down;Thread/unthread right pants leg;Thread/unthread left pants leg;Pull pants up/down;Don/Doff right sock;Don/Doff left sock;Don/Doff right shoe;Don/Doff left shoe Lower body dressing/undressing: 5: Set-up assist to: Obtain clothing  FIM - Toileting Toileting steps completed by patient: Adjust clothing prior to toileting;Performs perineal hygiene;Adjust clothing after toileting Toileting: 5: Supervision: Safety issues/verbal cues  FIM - Toilet Transfers Toilet Transfers: 4-From toilet/BSC: Min A (steadying Pt. > 75%);4-To toilet/BSC: Min A (steadying Pt. > 75%)  FIM - Bed/Chair Transfer Bed/Chair Transfer: 5: Supine > Sit: Supervision (verbal cues/safety issues);5: Bed > Chair or W/C:  Supervision (verbal cues/safety issues);5: Chair or W/C > Bed: Supervision (verbal cues/safety  issues)  FIM - Locomotion: Wheelchair Distance: 100' Locomotion: Wheelchair: 2: Travels 50 - 149 ft with supervision, cueing or coaxing FIM - Locomotion: Ambulation Ambulation/Gait Assistance: 3: Mod assist Locomotion: Ambulation: 2: Travels 50 - 149 ft with minimal assistance (Pt.>75%)  Comprehension Comprehension Mode: Auditory Comprehension: 5-Follows basic conversation/direction: With extra time/assistive device  Expression Expression Mode: Verbal Expression: 5-Expresses basic needs/ideas: With extra time/assistive device  Social Interaction Social Interaction: 3-Interacts appropriately 50 - 74% of the time - May be physically or verbally inappropriate.  Problem Solving Problem Solving: 3-Solves basic 50 - 74% of the time/requires cueing 25 - 49% of the time  Memory Memory: 3-Recognizes or recalls 50 - 74% of the time/requires cueing 25 - 49% of the time Medical Problem List and Plan:  1. DVT Prophylaxis/Anticoagulation: Mechanical: Sequential compression devices, below knee Bilateral lower extremities  2. Pain Management: monitor for now. rx pain but want to limit neurosedating meds as possible.  3. 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 Continue to follow  4. Neuropsych: This patient is not capable of making decisions on his/her own behalf.   -continue ritalin for stimulation/attention--it has been helpful 5. 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  Verdene Rio Vasotec, continue norvasc, Labetalol, -diuretics dc'ed due to hyponatremia  -consider further increase in labetalol  -still having an occasional spike, may be correlated to when he's having a headache    6. Hyponatremia: sodium 131. Likely diuretic effect  7. Hypokalemia: supplement   8. Seizure prophylaxis: continue keppra bid.  9. Dizziness:   ?orthostasis.   Have set parameters on BP  medicaitons.  10. Constipation :   bowel program.    LOS (Days) 7 A FACE TO FACE EVALUATION WAS PERFORMED  SWARTZ,ZACHARY T 06/30/2012 7:56 AM

## 2012-06-30 NOTE — Progress Notes (Signed)
Speech Language Pathology Daily Session Note  Patient Details  Name: Luis Oconnor MRN: [id number] Date of Birth: 06-Feb-1956  Today's Date: 06/30/2012 Time: 1330-1400 Time Calculation (min): 30 min  Short Term Goals: Week 1: SLP Short Term Goal 1 (Week 1): Pt will identify 2 cognitive impairments with Mod A question and semantic cues.  SLP Short Term Goal 1 - Progress (Week 1): Progressing toward goal SLP Short Term Goal 2 (Week 1): Pt will demonstrate selective attention during a functional and familiar task for 15 minutes with Mod A verbal cues for redirection. SLP Short Term Goal 2 - Progress (Week 1): Progressing toward goal SLP Short Term Goal 3 (Week 1): Pt will demonstrate attention to left field of enviornment during functional and familiar tasks with mod A question and verbal cues.  SLP Short Term Goal 3 - Progress (Week 1): Progressing toward goal SLP Short Term Goal 4 (Week 1): pt will demonstrate functional problem solving for functional and familiar tasks with Min A verbal cues.  SLP Short Term Goal 4 - Progress (Week 1): Progressing toward goal SLP Short Term Goal 5 (Week 1): Pt will recall new, daily events with use of external memory aids with Min A verbal and question cues.   Skilled Therapeutic Interventions: Treatment focus on cognitive goals. SLP facilitated session by providing Mod A question, verbal and visual cues for left attention to a visual scanning/attention task (making a calendar).  Pt also required Min A question cues to self-monitor and correct errors throughout the task. Pt demonstrated increased sustained attention to task and increased social interaction, suspect due to decreased pain.    FIM:  Comprehension Comprehension Mode: Auditory Comprehension: 4-Understands basic 75 - 89% of the time/requires cueing 10 - 24% of the time Expression Expression Mode: Verbal Expression: 5-Expresses basic 90% of the time/requires cueing < 10% of the time. Social  Interaction Social Interaction: 5-Interacts appropriately 90% of the time - Needs monitoring or encouragement for participation or interaction. Problem Solving Problem Solving: 3-Solves basic 50 - 74% of the time/requires cueing 25 - 49% of the time Memory Memory: 3-Recognizes or recalls 50 - 74% of the time/requires cueing 25 - 49% of the time  Pain Pain Assessment Pain Assessment: No/denies pain   Therapy/Group: Individual Therapy  Sartaj Hoskin 06/30/2012, 4:44 PM

## 2012-07-01 ENCOUNTER — Inpatient Hospital Stay (HOSPITAL_COMMUNITY): Payer: Medicaid Other | Admitting: Occupational Therapy

## 2012-07-01 ENCOUNTER — Inpatient Hospital Stay (HOSPITAL_COMMUNITY): Payer: Medicaid Other | Admitting: Physical Therapy

## 2012-07-01 ENCOUNTER — Inpatient Hospital Stay (HOSPITAL_COMMUNITY): Payer: Self-pay

## 2012-07-01 DIAGNOSIS — I619 Nontraumatic intracerebral hemorrhage, unspecified: Secondary | ICD-10-CM

## 2012-07-01 DIAGNOSIS — I10 Essential (primary) hypertension: Secondary | ICD-10-CM

## 2012-07-01 MED ORDER — CARBAMAZEPINE ER 200 MG PO TB12
200.0000 mg | ORAL_TABLET | Freq: Two times a day (BID) | ORAL | Status: DC
  Administered 2012-07-01 – 2012-07-04 (×6): 200 mg via ORAL
  Filled 2012-07-01 (×8): qty 1

## 2012-07-01 NOTE — Progress Notes (Signed)
Physical Therapy Session Note  Patient Details  Name: Luis Oconnor MRN: [id number] Date of Birth: 1956/01/09  Today's Date: 07/01/2012 Time:  - 900-955 55 minutes    Skilled Therapeutic Interventions/Progress Updates:  No c/o pain.  Gait training with RW with supervision > 150', no LOB.  Gait without AD with close supervision, distance limited by fatigue.  Berg balance test performed, 44/56, pt will use RW at all times at home.  Standing balance with ball toss/bounce on compliant surfaces with close supervision-min A.  Standing squats and core activation on compliant surfaces with min A for balance especially when fatigued.  Nu step for activity tolerance training x 8 minutes level 5.  Therapy Documentation  Balance: Standardized Balance Assessment Standardized Balance Assessment: Berg Balance Test Berg Balance Test Sit to Stand: Able to stand  independently using hands Standing Unsupported: Able to stand safely 2 minutes Sitting with Back Unsupported but Feet Supported on Floor or Stool: Able to sit safely and securely 2 minutes Stand to Sit: Sits safely with minimal use of hands Transfers: Able to transfer safely, definite need of hands Standing Unsupported with Eyes Closed: Able to stand 10 seconds safely Standing Ubsupported with Feet Together: Able to place feet together independently and stand 1 minute safely From Standing, Reach Forward with Outstretched Arm: Can reach forward >12 cm safely (5") From Standing Position, Pick up Object from Floor: Able to pick up shoe, needs supervision From Standing Position, Turn to Look Behind Over each Shoulder: Turn sideways only but maintains balance Turn 360 Degrees: Able to turn 360 degrees safely but slowly Standing Unsupported, Alternately Place Feet on Step/Stool: Able to stand independently and complete 8 steps >20 seconds Standing Unsupported, One Foot in Front: Able to take small step independently and hold 30 seconds Standing on  One Leg: Able to lift leg independently and hold 5-10 seconds Total Score: 44  See FIM for current functional status  Therapy/Group: Individual Therapy  DONAWERTH,KAREN 07/01/2012, 9:56 AM

## 2012-07-01 NOTE — Progress Notes (Signed)
Occupational Therapy Session Note  Patient Details  Name: Luis Oconnor MRN: [id number] Date of Birth: 09-May-1955  Today's Date: 07/01/2012 Time: 4098-1191 Time Calculation (min): 45 min  Short Term Goals: Week 1:  OT Short Term Goal 1 (Week 1): Pt will initiate a functional task within 2 min OT Short Term Goal 2 (Week 1): Pt will locate items in left field with min questioning cues OT Short Term Goal 3 (Week 1): Pt will perform tub shower transfer with supervision OT Short Term Goal 4 (Week 1): Pt will demonstrate selective attention in a minimal distracting environment with min cuing  Skilled Therapeutic Interventions/Progress Updates:    1:1 Pt in chair with lunch in front of him. Pt has still not eaten. Heated lunch with repositioned pt and min cuing for initiation of meal. Pt then able to complete in a timely manner. Emergent awareness with min A; asked if pt needed to go to the bathroom he reported no; but then when ambulating out of room pt reports need to go to the bathroom; pt wearing pants on backwards- unaware. Difficulty with anticipatory awareness. Discussed job requirements and not being able to complete those at this time. Functional ambulation outside on uneven terrain, sit to stand from different height surfaces, standing balance, pathway finding, working memory, mod cuing for attention to left in environment.   Performed stairs to work on coordination, perceptual awareness, visual scanning and attention to left.   Therapy Documentation Precautions:  Precautions Precautions: Fall Precaution Comments: BP, orthostatic Restrictions Weight Bearing Restrictions: No Pain: Pain Assessment Pain Assessment: No/denies pain  See FIM for current functional status  Therapy/Group: Individual Therapy  Roney Mans [facility] 07/01/2012, 2:16 PM

## 2012-07-01 NOTE — Progress Notes (Addendum)
Subjective/Complaints: Still having headaches. meds help but headaches recur A 12 point review of systems has been performed and if not noted above is otherwise negative.   Objective: Vital Signs: Blood pressure 130/60, pulse 76, temperature 98.4 F (36.9 C), temperature source Oral, resp. rate 17, height 5\' 6"  (1.676 m), weight 62.9 kg (138 lb 10.7 oz), SpO2 98.00%. No results found. No results found for this basename: WBC, HGB, HCT, PLT,  in the last 72 hours  Recent Labs  06/29/12 0734  NA 131*  K 4.8  CL 97  GLUCOSE 111*  BUN 21  CREATININE 0.93  CALCIUM 9.1   CBG (last 3)  No results found for this basename: GLUCAP,  in the last 72 hours  Wt Readings from Last 3 Encounters:  06/24/12 62.9 kg (138 lb 10.7 oz)  06/20/12 66.5 kg (146 lb 9.7 oz)  06/20/12 66.5 kg (146 lb 9.7 oz)    Physical Exam:  Constitutional: He appears well-nourished.   HENT:  Head: Normocephalic and atraumatic.  Right Ear: External ear normal.  Left Ear: External ear normal.  Nose: Nose normal.  Mouth/Throat: Oropharynx is clear and moist.  Right crani incision intact.  Eyes: Conjunctivae are normal. Pupils are equal, round, and reactive to light. Right eye exhibits no discharge. Left eye exhibits no discharge.  Neck: Normal range of motion. No JVD present. No tracheal deviation present. No thyromegaly present.  Cardiovascular: Normal rate and regular rhythm. Exam reveals no gallop and no friction rub.  No murmur heard.  Pulmonary/Chest: Effort normal. No respiratory distress. He has no wheezes. He has no rales. He exhibits no tenderness.  Abdominal: Soft. Bowel sounds are normal. He exhibits no distension. There is no tenderness. There is no rebound.  Musculoskeletal: He exhibits no edema.  Lymphadenopathy: none  Neurological:    Alert and oriented. Appropriate conversation.  Moved all 4's with 3+ to 4/5 strength. Sensed pain in all 4 limbs.     Psychiatric:  Generally  alert and  appropriate.  Assessment/Plan: 1. 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 Physiatrist is providing close team supervision and 24 hour management of active medical problems listed below. Physiatrist and rehab team continue to assess barriers to discharge/monitor patient progress toward functional and medical goals. FIM: FIM - Bathing Bathing Steps Patient Completed: Chest;Right Arm;Left Arm;Abdomen;Front perineal area;Buttocks;Right upper leg;Left upper leg;Right lower leg (including foot);Left lower leg (including foot) Bathing: 5: Set-up assist to: Adjust water temp  FIM - Upper Body Dressing/Undressing Upper body dressing/undressing steps patient completed: Thread/unthread right sleeve of pullover shirt/dresss;Thread/unthread left sleeve of pullover shirt/dress;Put head through opening of pull over shirt/dress;Pull shirt over trunk Upper body dressing/undressing: 5: Supervision: Safety issues/verbal cues FIM - Lower Body Dressing/Undressing Lower body dressing/undressing steps patient completed: Thread/unthread right underwear leg;Thread/unthread left underwear leg;Pull underwear up/down;Thread/unthread right pants leg;Thread/unthread left pants leg;Pull pants up/down;Don/Doff right sock;Don/Doff left sock;Don/Doff right shoe;Don/Doff left shoe Lower body dressing/undressing: 5: Set-up assist to: Obtain clothing  FIM - Toileting Toileting steps completed by patient: Adjust clothing prior to toileting;Performs perineal hygiene;Adjust clothing after toileting Toileting Assistive Devices: Grab bar or rail for support Toileting: 5: Supervision: Safety issues/verbal cues  FIM - Diplomatic Services operational officer Devices: Grab bars;Walker Toilet Transfers: 4-To toilet/BSC: Min A (steadying Pt. > 75%);4-From toilet/BSC: Min A (steadying Pt. > 75%)  FIM -  Bed/Chair Transfer Bed/Chair Transfer: 5: Supine > Sit: Supervision (verbal cues/safety issues);5:  Sit > Supine: Supervision (verbal cues/safety issues)  FIM - Locomotion: Wheelchair Distance: 100' Locomotion: Wheelchair: 2: Travels 50 - 149 ft with supervision, cueing or coaxing FIM - Locomotion: Ambulation Ambulation/Gait Assistance: 3: Mod assist Locomotion: Ambulation: 4: Travels 150 ft or more with minimal assistance (Pt.>75%)  Comprehension Comprehension Mode: Auditory Comprehension: 4-Understands basic 75 - 89% of the time/requires cueing 10 - 24% of the time  Expression Expression Mode: Verbal Expression: 5-Expresses basic 90% of the time/requires cueing < 10% of the time.  Social Interaction Social Interaction: 4-Interacts appropriately 75 - 89% of the time - Needs redirection for appropriate language or to initiate interaction.  Problem Solving Problem Solving: 3-Solves basic 50 - 74% of the time/requires cueing 25 - 49% of the time  Memory Memory: 3-Recognizes or recalls 50 - 74% of the time/requires cueing 25 - 49% of the time Medical Problem List and Plan:  1. DVT Prophylaxis/Anticoagulation: Mechanical: Sequential compression devices, below knee Bilateral lower extremities  2. Pain Management: monitor for now. rx pain but want to limit neurosedating meds as possible.  3. 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 Continue to follow  4. Neuropsych: This patient is not capable of making decisions on his/her own behalf.   -continue ritalin for stimulation/attention--it has been helpful 5. 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  Verdene Rio Vasotec, continue norvasc, Labetalol, -diuretics dc'ed due to hyponatremia  -consider further increase in labetalol  -still having an occasional spike, may be correlated to when he's having a headache    6. Hyponatremia: sodium 131. Likely diuretic effect  7. Hypokalemia: supplement   8. Seizure  prophylaxis: changed to tegretol yesterday---increase to 200mg  bid to help with headaches also 9. Dizziness:   ?orthostasis.   Have set parameters on BP medicaitons.  10. Constipation :   bowel program.    LOS (Days) 8 A FACE TO FACE EVALUATION WAS PERFORMED  Soundra Lampley T 07/01/2012 8:29 AM

## 2012-07-01 NOTE — Progress Notes (Signed)
Speech Language Pathology Daily Session Note  Patient Details  Name: Luis Oconnor MRN: [id number] Date of Birth: 1955-11-06  Today's Date: 07/01/2012 Time: 1015-1045 Time Calculation (min): 30 min  Short Term Goals: Week 1:  see previous note (populated week 2 instead of week 1)  Skilled Therapeutic Interventions: Pt. in bed, sleepy after PT session but arousable and cooperative for cognitive therapy.  Pt. stated diagnosis and verbalized 2 deficits without cues.  SLP reviewed strategy of using edging with finger to locate items/text on left side of page.  He was only 30% accurate on visual activity involving locating/circling items in category on left side of page.  He needed max visual and verbal cues to problem solve and sequence 4 pictures.    FIM:  Comprehension Comprehension: 5-Understands basic 90% of the time/requires cueing < 10% of the time Expression Expression: 5-Expresses basic needs/ideas: With no assist Social Interaction Social Interaction: 5-Interacts appropriately 90% of the time - Needs monitoring or encouragement for participation or interaction. Problem Solving Problem Solving: 3-Solves basic 50 - 74% of the time/requires cueing 25 - 49% of the time Memory Memory: 4-Recognizes or recalls 75 - 89% of the time/requires cueing 10 - 24% of the time  Pain Pain Assessment Pain Assessment: No/denies pain Pain Score:   4 Pain Type: Acute pain Pain Location: Head Pain Descriptors: Headache Pain Onset: On-going Pain Intervention(s): Medication (See eMAR)  Therapy/Group: Individual Therapy  Royce Macadamia 07/01/2012, 12:46 PM

## 2012-07-01 NOTE — Progress Notes (Signed)
Physical Therapy Weekly Progress Note  Patient Details  Name: Luis Oconnor MRN: [id number] Date of Birth: 04-Jun-1955  Today's Date: 07/01/2012  Patient has met 3 of 3 short term goals.  Pt has improved balance and activity tolerance, able to gait with RW with supervision, gait without AD with close supervision-min A.  Standing balance and tolerance has improved and he is able to balance with close supervision.  Pt continues to be limited by decreased awareness and problem solving as well as L perceptual deficits and inattention.  Pt requires cuing for sequencing and is only able to follow 1 step commands during therapeutic activities.  Patient will continue to benefit from skilled PT intervention to enhance overall performance with activity tolerance, balance, ability to compensate for deficits, attention and awareness.  Patient progressing toward long term goals..  Continue plan of care.  PT Short Term Goals Week 1:  PT Short Term Goal 1 (Week 1): Pt will perform dynamic balance during functional task with min A PT Short Term Goal 1 - Progress (Week 1): Met PT Short Term Goal 2 (Week 1): Pt will gait in controlled environment with min A 100' PT Short Term Goal 2 - Progress (Week 1): Met PT Short Term Goal 3 (Week 1): Pt will demo emergent awareness during functional task with mod A PT Short Term Goal 3 - Progress (Week 1): Met  Skilled Therapeutic Interventions/Progress Updates:  Community reintegration;Functional electrical stimulation;Patient/family education;Stair training;UE/LE Coordination activities;UE/LE Strength taining/ROM;Pain management;Splinting/orthotics;Cognitive remediation/compensation;DME/adaptive equipment instruction;Balance/vestibular training;Neuromuscular re-education;Therapeutic Exercise;Wheelchair propulsion/positioning;Therapeutic Activities;Functional mobility training;Discharge planning;Ambulation/gait training    See FIM for current functional  status    Cedrik Heindl 07/01/2012, 7:57 AM

## 2012-07-01 NOTE — Progress Notes (Signed)
Occupational Therapy Session Note  Patient Details  Name: Luis Oconnor MRN: [id number] Date of Birth: 13-Feb-1956  Today's Date: 07/01/2012 Time: 0805-0900 Time Calculation (min): 55 min  Short Term Goals: Week 1:  OT Short Term Goal 1 (Week 1): Pt will initiate a functional task within 2 min OT Short Term Goal 2 (Week 1): Pt will locate items in left field with min questioning cues OT Short Term Goal 3 (Week 1): Pt will perform tub shower transfer with supervision OT Short Term Goal 4 (Week 1): Pt will demonstrate selective attention in a minimal distracting environment with min cuing  Skilled Therapeutic Interventions/Progress Updates:    Worked on ADL during session.  Pt overall supervision with use of RW to gather clothes and perform shower transfer.  Pt continues to need mod instructional and questionable to cueing to determine what clothes he needs to get out as well as to sequence showering task, secondary to perseverating on washing the same parts over and over.  Demonstrates decreased selective attention as well if distracted with conversation during bathing/dressing task.  When exiting the bathroom he left his walker in there and walked out using the door frame and sink to steady him for transferring to the bed.  He needed mod instructional cueing to remember to apply deodorant and get out his shoes and socks but was actually able to sequence the dressing without cueing.    Therapy Documentation Precautions:  Precautions Precautions: Fall Precaution Comments: BP, orthostatic Restrictions Weight Bearing Restrictions: No   Pain: Pain Assessment Pain Assessment: 0-10 Pain Score:   9 Pain Type: Acute pain Pain Location: Head Pain Descriptors: Headache Pain Onset: On-going Pain Intervention(s): Medication (See eMAR) ADL: See FIM for current functional status  Therapy/Group: Individual Therapy  Colbe Viviano OTR/L 07/01/2012, 11:11 AM

## 2012-07-02 ENCOUNTER — Inpatient Hospital Stay (HOSPITAL_COMMUNITY): Payer: Medicaid Other | Admitting: Speech Pathology

## 2012-07-02 ENCOUNTER — Inpatient Hospital Stay (HOSPITAL_COMMUNITY): Payer: Self-pay | Admitting: Occupational Therapy

## 2012-07-02 ENCOUNTER — Inpatient Hospital Stay (HOSPITAL_COMMUNITY): Payer: Medicaid Other | Admitting: Physical Therapy

## 2012-07-02 ENCOUNTER — Encounter (HOSPITAL_COMMUNITY): Payer: Self-pay | Admitting: Occupational Therapy

## 2012-07-02 MED ORDER — METHYLPHENIDATE HCL 5 MG PO TABS
10.0000 mg | ORAL_TABLET | Freq: Two times a day (BID) | ORAL | Status: DC
  Administered 2012-07-02 – 2012-07-04 (×4): 10 mg via ORAL
  Filled 2012-07-02 (×4): qty 2

## 2012-07-02 NOTE — Progress Notes (Signed)
Occupational Therapy Session Note  Patient Details  Name: Luis Oconnor MRN: [id number] Date of Birth: May 25, 1955  Today's Date: 07/02/2012 Time: 4098-1191 Time Calculation (min): 30 min  Short Term Goals: Week 1:  OT Short Term Goal 1 (Week 1): Pt will initiate a functional task within 2 min OT Short Term Goal 2 (Week 1): Pt will locate items in left field with min questioning cues OT Short Term Goal 3 (Week 1): Pt will perform tub shower transfer with supervision OT Short Term Goal 4 (Week 1): Pt will demonstrate selective attention in a minimal distracting environment with min cuing  Skilled Therapeutic Interventions/Progress Updates:    1:1 Focus on initiation, pathway finding around the unit, standing balance, functional ambulation with RW, safety with RW, visual scanning, visual attention to left, navigating obstacles in environement. Pt remembered he had laundry performed this am in the dryer and requested to go and get it. Pt required mod cuing for navigating to laundry room and then back to his room to hang clothes. Pt with improved visual spatial ability demonstrated with putting clothes on hanger and hanging them in closet. Practiced tub bench transfer into tub/shower versus stepping over ledge of shower with steady. Will discuss with family tomorrow in therapy.  Therapy Documentation Precautions:  Precautions Precautions: Fall Precaution Comments: BP, orthostatic Restrictions Weight Bearing Restrictions: No Pain: Pain Assessment Pain Assessment: No/denies pain  See FIM for current functional status  Therapy/Group: Individual Therapy  Roney Mans [facility] 07/02/2012, 2:58 PM

## 2012-07-02 NOTE — Progress Notes (Signed)
Subjective/Complaints: Still with initiation and awareness issues. A 12 point review of systems has been performed and if not noted above is otherwise negative.   Objective: Vital Signs: Blood pressure 151/85, pulse 52, temperature 98.2 F (36.8 C), temperature source Oral, resp. rate 20, height 5\' 6"  (1.676 m), weight 62.052 kg (136 lb 12.8 oz), SpO2 99.00%. No results found. No results found for this basename: WBC, HGB, HCT, PLT,  in the last 72 hours No results found for this basename: NA, K, CL, CO, GLUCOSE, BUN, CREATININE, CALCIUM,  in the last 72 hours CBG (last 3)  No results found for this basename: GLUCAP,  in the last 72 hours  Wt Readings from Last 3 Encounters:  07/01/12 62.052 kg (136 lb 12.8 oz)  06/20/12 66.5 kg (146 lb 9.7 oz)  06/20/12 66.5 kg (146 lb 9.7 oz)    Physical Exam:  Constitutional: He appears well-nourished.   HENT:  Head: Normocephalic and atraumatic.  Right Ear: External ear normal.  Left Ear: External ear normal.  Nose: Nose normal.  Mouth/Throat: Oropharynx is clear and moist.  Right crani incision intact.  Eyes: Conjunctivae are normal. Pupils are equal, round, and reactive to light. Right eye exhibits no discharge. Left eye exhibits no discharge.  Neck: Normal range of motion. No JVD present. No tracheal deviation present. No thyromegaly present.  Cardiovascular: Normal rate and regular rhythm. Exam reveals no gallop and no friction rub.  No murmur heard.  Pulmonary/Chest: Effort normal. No respiratory distress. He has no wheezes. He has no rales. He exhibits no tenderness.  Abdominal: Soft. Bowel sounds are normal. He exhibits no distension. There is no tenderness. There is no rebound.  Musculoskeletal: He exhibits no edema.  Lymphadenopathy: none  Neurological:    Alert and oriented. Appropriate conversation.  Moved all 4's with 3+ to 4/5 strength. Sensed pain in all 4 limbs.     Psychiatric:  Generally  alert and  appropriate.  Assessment/Plan: 1. 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 Physiatrist is providing close team supervision and 24 hour management of active medical problems listed below. Physiatrist and rehab team continue to assess barriers to discharge/monitor patient progress toward functional and medical goals. FIM: FIM - Bathing Bathing Steps Patient Completed: Chest;Right Arm;Left Arm;Abdomen;Front perineal area;Buttocks;Right upper leg;Left upper leg;Right lower leg (including foot);Left lower leg (including foot) Bathing: 5: Set-up assist to: Adjust water temp  FIM - Upper Body Dressing/Undressing Upper body dressing/undressing steps patient completed: Thread/unthread right sleeve of pullover shirt/dresss;Thread/unthread left sleeve of pullover shirt/dress;Put head through opening of pull over shirt/dress;Pull shirt over trunk Upper body dressing/undressing: 5: Supervision: Safety issues/verbal cues FIM - Lower Body Dressing/Undressing Lower body dressing/undressing steps patient completed: Thread/unthread right underwear leg;Thread/unthread left underwear leg;Pull underwear up/down;Thread/unthread right pants leg;Thread/unthread left pants leg;Pull pants up/down;Don/Doff left shoe;Don/Doff right shoe;Don/Doff right sock;Don/Doff left sock Lower body dressing/undressing: 5: Set-up assist to: Obtain clothing  FIM - Toileting Toileting steps completed by patient: Adjust clothing prior to toileting;Performs perineal hygiene;Adjust clothing after toileting Toileting Assistive Devices: Grab bar or rail for support Toileting: 5: Supervision: Safety issues/verbal cues  FIM - Diplomatic Services operational officer Devices: Grab bars;Walker Toilet Transfers: 4-To toilet/BSC: Min A (steadying Pt. > 75%);4-From toilet/BSC: Min A (steadying Pt. > 75%)  FIM -  Bed/Chair Transfer Bed/Chair Transfer: 5: Chair or W/C > Bed: Supervision (verbal cues/safety issues);5: Bed > Chair or W/C: Supervision (verbal cues/safety issues)  FIM - Locomotion: Wheelchair Distance: 100' Locomotion: Wheelchair: 2: Travels 50 - 149 ft with supervision, cueing or coaxing FIM - Locomotion: Ambulation Ambulation/Gait Assistance: 3: Mod assist Locomotion: Ambulation: 5: Travels 150 ft or more with supervision/safety issues  Comprehension Comprehension Mode: Auditory Comprehension: 5-Understands basic 90% of the time/requires cueing < 10% of the time  Expression Expression Mode: Verbal Expression: 5-Expresses complex 90% of the time/cues < 10% of the time  Social Interaction Social Interaction: 5-Interacts appropriately 90% of the time - Needs monitoring or encouragement for participation or interaction.  Problem Solving Problem Solving: 3-Solves basic 50 - 74% of the time/requires cueing 25 - 49% of the time  Memory Memory: 4-Recognizes or recalls 75 - 89% of the time/requires cueing 10 - 24% of the time Medical Problem List and Plan:  1. DVT Prophylaxis/Anticoagulation: Mechanical: Sequential compression devices, below knee Bilateral lower extremities  2. Pain Management: monitor for now. rx pain but want to limit neurosedating meds as possible.  3. 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 Continue to follow  4. Neuropsych: This patient is not capable of making decisions on his/her own behalf.   -increase ritalin to 10mg  5. 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  Verdene Rio Vasotec, continue norvasc, Labetalol, -diuretics dc'ed due to hyponatremia  -consider further increase in labetalol  -still having an occasional spike, may be correlated to when he's having a headache    6. Hyponatremia: sodium improving. Likely diuretic effect  7. Hypokalemia: supplement   8. Seizure prophylaxis: changed to tegretol yesterday---increased to  200mg  bid to help with headaches also 9. Dizziness:   ?orthostasis.   Have set parameters on BP medicaitons.  10. Constipation :   bowel program.    LOS (Days) 9 A FACE TO FACE EVALUATION WAS PERFORMED  Tyriana Helmkamp T 07/02/2012 8:07 AM

## 2012-07-02 NOTE — Progress Notes (Signed)
Speech Language Pathology Daily Session Note  Patient Details  Name: Luis Oconnor MRN: [id number] Date of Birth: [birth year]  Today's Date: 07/02/2012 Time: 0800-0845 Time Calculation (min): 45 min  Short Term Goals: Week 1: SLP Short Term Goal 1 (Week 1): Pt will identify 2 cognitive impairments with Mod A question and semantic cues.  SLP Short Term Goal 1 - Progress (Week 1): Progressing toward goal SLP Short Term Goal 2 (Week 1): Pt will demonstrate selective attention during a functional and familiar task for 15 minutes with Mod A verbal cues for redirection. SLP Short Term Goal 2 - Progress (Week 1): Progressing toward goal SLP Short Term Goal 3 (Week 1): Pt will demonstrate attention to left field of enviornment during functional and familiar tasks with mod A question and verbal cues.  SLP Short Term Goal 3 - Progress (Week 1): Progressing toward goal SLP Short Term Goal 4 (Week 1): pt will demonstrate functional problem solving for functional and familiar tasks with Min A verbal cues.  SLP Short Term Goal 4 - Progress (Week 1): Progressing toward goal SLP Short Term Goal 5 (Week 1): Pt will recall new, daily events with use of external memory aids with Min A verbal and question cues.   Skilled Therapeutic Interventions: Treatment focus on cognitive goals. SLP facilitated session by providing min verbal cues to locate items on left side of meal tray. Pt also participated in self-care tasks and initiated and terminated tasks with Mod I but required Min verbal and question cues for attention to left field of environment with mobility with RW.  Pt required supervision question cues for reading comprehension at the paragraph level and Mod A verbal and visual cues for left attention/scanning during reading task. Pt demonstrated a brighter affect with increased social interaction and engagement. Pt also demonstrated increased anticipatory awareness in regards to tasks/activities he should avoid  at discharge due to current impairments.    FIM:  Comprehension Comprehension Mode: Auditory Comprehension: 5-Understands basic 90% of the time/requires cueing < 10% of the time Expression Expression Mode: Verbal Expression: 5-Expresses basic needs/ideas: With extra time/assistive device Social Interaction Social Interaction: 5-Interacts appropriately 90% of the time - Needs monitoring or encouragement for participation or interaction. Problem Solving Problem Solving: 4-Solves basic 75 - 89% of the time/requires cueing 10 - 24% of the time Memory Memory: 4-Recognizes or recalls 75 - 89% of the time/requires cueing 10 - 24% of the time FIM - Eating Eating Activity: 6: More than reasonable amount of time  Pain Pain Assessment Pain Assessment: No/denies pain  Therapy/Group: Individual Therapy  Luis Oconnor 07/02/2012, 1:23 PM

## 2012-07-02 NOTE — Progress Notes (Signed)
Social Work Patient ID: Luis Oconnor, male   DOB: Feb 18, 1956, 57 y.o.   MRN: [id number]  Met yesterday with patient and sister to review team conference (sister to relay to pt's girlfriend) - all aware and agreeable with targeted d/c date of 4/5 at supervision goals.  Girlfriend and sister to share in providing this assist and family education has been scheduled for Friday @ 10am.  Will follow up with them at that time to discuss further therapies.  Adebayo Ensminger, LCSW

## 2012-07-02 NOTE — Progress Notes (Signed)
Occupational Therapy Session Note  Patient Details  Name: Luis Oconnor MRN: [id number] Date of Birth: 07-01-1955  Today's Date: 07/02/2012 Time: 1105-1200 Time Calculation (min): 55 min  Short Term Goals: Week 1:  OT Short Term Goal 1 (Week 1): Pt will initiate a functional task within 2 min OT Short Term Goal 2 (Week 1): Pt will locate items in left field with min questioning cues OT Short Term Goal 3 (Week 1): Pt will perform tub shower transfer with supervision OT Short Term Goal 4 (Week 1): Pt will demonstrate selective attention in a minimal distracting environment with min cuing  Skilled Therapeutic Interventions/Progress Updates:    self care retraining at shower level with focus on initiating tasks with therapists reducing the amount of cuing and presence involved in picking out clothes, getting undressed and getting into the shower. Pt demonstrated decreased preservation with tasks today when given extra time and the demand to complete tasks in a timely manner. Pt did require cuing for putting on dirty clothes instead of clean ones he picked out. Pt able to self correct task once discussed error in dressing. Participated in path finding around unit with attention to scanning to the left to locate laundry room to perform laundry task.  Therapy Documentation Precautions:  Precautions Precautions: Fall Precaution Comments: BP, orthostatic Restrictions Weight Bearing Restrictions: No Pain:  no c/o pain See FIM for current functional status  Therapy/Group: Individual Therapy  Roney Mans [facility] 07/02/2012, 11:58 AM

## 2012-07-02 NOTE — Progress Notes (Signed)
Physical Therapy Note  Patient Details  Name: Luis Oconnor MRN: [id number] Date of Birth: [birth year] Today's Date: 07/02/2012  Time: 1000-1056 56 minutes  No c/o pain.  Gait training outdoors on a variety of surfaces with RW, close supervision, min cuing for safety and attention to L.  Path finding and community negotiation with min-mod questioning cues, mod cuing for attention to L.  Standing balance on foam with UE/LE exercises, pt able to tolerate 5 minutes on foam with dynamic movements before fatigued.  Nu step for LE strength and endurance x 8 minutes level 4.  Gait training without AD for balance, pt requires close supervision.  Individual therapy   Ricardo Schubach 07/02/2012, 10:58 AM

## 2012-07-03 ENCOUNTER — Encounter (HOSPITAL_COMMUNITY): Payer: Self-pay | Admitting: Occupational Therapy

## 2012-07-03 ENCOUNTER — Inpatient Hospital Stay (HOSPITAL_COMMUNITY): Payer: Medicaid Other | Admitting: Physical Therapy

## 2012-07-03 ENCOUNTER — Inpatient Hospital Stay (HOSPITAL_COMMUNITY): Payer: Medicaid Other | Admitting: Speech Pathology

## 2012-07-03 DIAGNOSIS — I10 Essential (primary) hypertension: Secondary | ICD-10-CM

## 2012-07-03 DIAGNOSIS — I619 Nontraumatic intracerebral hemorrhage, unspecified: Secondary | ICD-10-CM

## 2012-07-03 MED ORDER — THERA VITAL M PO TABS: 1.0000 | ORAL_TABLET | Freq: Every day | ORAL | Status: DC

## 2012-07-03 MED ORDER — LABETALOL HCL 100 MG PO TABS
100.0000 mg | ORAL_TABLET | Freq: Three times a day (TID) | ORAL | Status: DC
  Administered 2012-07-03 – 2012-07-04 (×3): 100 mg via ORAL
  Filled 2012-07-03 (×6): qty 1

## 2012-07-03 MED ORDER — AMLODIPINE BESYLATE 10 MG PO TABS: 10.0000 mg | ORAL_TABLET | Freq: Every day | ORAL | Status: DC

## 2012-07-03 MED ORDER — METHYLPHENIDATE HCL 10 MG PO TABS: 10.0000 mg | ORAL_TABLET | Freq: Two times a day (BID) | ORAL | Status: DC

## 2012-07-03 MED ORDER — ENALAPRIL MALEATE 10 MG PO TABS: 10.0000 mg | ORAL_TABLET | Freq: Two times a day (BID) | ORAL | Status: DC

## 2012-07-03 MED ORDER — LABETALOL HCL 100 MG PO TABS: 100.0000 mg | ORAL_TABLET | Freq: Three times a day (TID) | ORAL | Status: DC

## 2012-07-03 MED ORDER — OXYCODONE HCL 5 MG PO TABS: ORAL_TABLET | ORAL | Status: DC

## 2012-07-03 MED ORDER — POLYETHYLENE GLYCOL 3350 17 G PO PACK: 17.0000 g | PACK | Freq: Two times a day (BID) | ORAL | Status: DC | PRN

## 2012-07-03 MED ORDER — CARBAMAZEPINE ER 200 MG PO TB12: 200.0000 mg | ORAL_TABLET | Freq: Two times a day (BID) | ORAL | Status: DC

## 2012-07-03 NOTE — Discharge Summary (Signed)
Physician Discharge Summary  Patient ID: Luis Oconnor MRN: [id number] DOB/AGE: 1956-03-03 57 y.o.  Admit date: 06/23/2012 Discharge date: 07/04/12  Discharge Diagnoses:  Principal Problem:   Cerebral parenchymal hemorrhage Active Problems:   HTN (hypertension)   Hyponatremia   Headache   Discharged Condition: Good.  Significant Diagnostic Studies: Ct Head Wo Contrast  06/23/2012  *RADIOLOGY REPORT*  Clinical Data: Follow-up.  No significant status changes recently.  CT HEAD WITHOUT CONTRAST  Technique:  Contiguous axial images were obtained from the base of the skull through the vertex without contrast.  Comparison: 06/19/2012.  Findings: Persistent 2 mm of right to left midline shift. Postoperative changes of right parietal craniotomy.  Right temporal parenchymal hematoma with surrounding vasogenic edema shows expected evolutionary changes.  Edema in the right parietal lobe appears similar.  Improvement in pneumocephalus associated with decompression.  Streak artifact is present in the left frontal region.  Blood along the falx appears similar.  There is no hydrocephalus.  Third and fourth ventricles appear decompressed.  IMPRESSION: Expected evolution of right temporal hematoma and postoperative changes of right parietal craniotomy.  Unchanged 2 mm right to left midline shift.   Original Report Authenticated By: Andreas Newport, M.D.     Labs:  Basic Metabolic Panel: No results found for this basename: NA, K, CL, CO2, GLUCOSE, BUN, CREATININE, CALCIUM, MG, PHOS,  in the last 168 hours  CBC: No results found for this basename: WBC, NEUTROABS, HGB, HCT, MCV, PLT,  in the last 168 hours  CBG: No results found for this basename: GLUCAP,  in the last 168 hours  HPI:     Hospital Course: Luis Oconnor was admitted to rehab 06/23/2012 for inpatient therapies to consist of PT, ST and OT at least three hours five days a week. Past admission physiatrist, therapy team and rehab RN have  worked together to provide customized collaborative inpatient rehab. His blood pressures were monitored on bid basis and medications were titrated for better control. Patient was noted to have poor awareness with impaired attention due to dizziness and significant headaches. He was also noted to have cognitive deficits with left inattention. Headaches were treated with fentanyl and oxycodone initially.  His keppra was changed to Depakote to help with his HA symptoms as well as financial issues.  Fentanyl was discontinued prior to discharge and headaches have greatly improved on current dose Depakote.   He was hyponatremic at admission with sodium at 126. HCTZ was discontinued and patient was placed on fluid restriction with slow improvement in sodium levels to 131. Reactive leucocytosis has resolved. Crani incision has healed well without signs or symptoms of infections and staples discontinued without difficulty. Patient has been continent of bowel and bladder. He was started on ritalin to help with attention, awareness and activation. He's to continue on this till follow up in office. He has made great progress during his stay and at supervision level overall at time of discharge.    Rehab Course:  During patient's stay in rehab weekly team conferences were held to monitor patient's progress, set goals and discuss barriers to discharge. Speech therapy has focused on attention, working memory with utilization of compensatory strategies, problem solving, awareness and attention to left field of environment. Pt is at supervision-min A level in regards to cognitive function with basic and familiar tasks. Occupational therapy has worked with patient on ADLs, left inattention due to visuo-perceptual deficits. Patient is at supervision level for bathing and dressing tasks.  Physical therapy has  focused on balance, mobility and endurance. Patient is at supervision level for ambulation of varied surfaces, stair  navigation and car transfers. Family education was done with wife and sister about supervision as well as cueing required for safety. Patient is discharged to home in improved condition.   Disposition: 01-Home or Self Care   Future Appointments Provider Department Dept Phone   07/07/2012 9:00 AM Barron Alvine, CCC-SLP [facility] [phone]   07/07/2012 9:45 AM Trixie Deis [facility] [phone]   07/13/2012 1:00 PM Timoteo Gaul, [place] [facility] [phone]   07/28/2012 11:40 AM Ranelle Oyster, MD  [facility] [phone]       Medication List    STOP taking these medications       hydrochlorothiazide 25 MG tablet  Commonly known as:  HYDRODIURIL     metoprolol 100 MG tablet  Commonly known as:  LOPRESSOR     oxymetazoline 0.05 % nasal spray  Commonly known as:  AFRIN      TAKE these medications       amLODipine 10 MG tablet  Commonly known as:  NORVASC  Take 1 tablet (10 mg total) by mouth daily.     carbamazepine 200 MG 12 hr tablet  Commonly known as:  TEGRETOL XR  Take 1 tablet (200 mg total) by mouth 2 (two) times daily.     enalapril 10 MG tablet  Commonly known as:  VASOTEC  Take 1 tablet (10 mg total) by mouth 2 (two) times daily.     labetalol 100 MG tablet  Commonly known as:  NORMODYNE  Take 1 tablet (100 mg total) by mouth 3 (three) times daily.     methylphenidate 10 MG tablet--Rx for #60 given.   Commonly known as:  RITALIN  Take 1 tablet (10 mg total) by mouth 2 (two) times daily with breakfast and lunch.     multivitamin tablet  Take 1 tablet by mouth daily.     oxyCODONE 5 MG immediate release tablet--Rx for #90 given.  Commonly known as:  Oxy IR/ROXICODONE  Take on pill every four hours as needed for severe headaches.     polyethylene glycol packet  Commonly known  as:  MIRALAX / GLYCOLAX  Take 17 g by mouth 2 (two) times daily as needed. For constipation       Follow-up Information   Follow up with Ranelle Oyster, MD On 07/28/2012. (be there at 11:20 for 11:40 appointment)    Contact information:   [address], [address] [place] [address] [phone]       Follow up with [name], MD. Call today. (for follow up in 2-3 weeks. )    Contact information:   [address] [place] [address] [phone]       Signed: Jacquelynn Cree 07/06/2012, 9:13 AM

## 2012-07-03 NOTE — Progress Notes (Signed)
Subjective/Complaints: Headache is better. Up and alert this am. Awaiting breakfast.  A 12 point review of systems has been performed and if not noted above is otherwise negative.   Objective: Vital Signs: Blood pressure 141/87, pulse 80, temperature 98.2 F (36.8 C), temperature source Oral, resp. rate 20, height 5\' 6"  (1.676 m), weight 62.052 kg (136 lb 12.8 oz), SpO2 98.00%. No results found. No results found for this basename: WBC, HGB, HCT, PLT,  in the last 72 hours No results found for this basename: NA, K, CL, CO, GLUCOSE, BUN, CREATININE, CALCIUM,  in the last 72 hours CBG (last 3)  No results found for this basename: GLUCAP,  in the last 72 hours  Wt Readings from Last 3 Encounters:  07/01/12 62.052 kg (136 lb 12.8 oz)  06/20/12 66.5 kg (146 lb 9.7 oz)  06/20/12 66.5 kg (146 lb 9.7 oz)    Physical Exam:  Constitutional: He appears well-nourished.   HENT:  Head: Normocephalic and atraumatic.  Right Ear: External ear normal.  Left Ear: External ear normal.  Nose: Nose normal.  Mouth/Throat: Oropharynx is clear and moist.  Right crani incision intact.  Eyes: Conjunctivae are normal. Pupils are equal, round, and reactive to light. Right eye exhibits no discharge. Left eye exhibits no discharge.  Neck: Normal range of motion. No JVD present. No tracheal deviation present. No thyromegaly present.  Cardiovascular: Normal rate and regular rhythm. Exam reveals no gallop and no friction rub.  No murmur heard.  Pulmonary/Chest: Effort normal. No respiratory distress. He has no wheezes. He has no rales. He exhibits no tenderness.  Abdominal: Soft. Bowel sounds are normal. He exhibits no distension. There is no tenderness. There is no rebound.  Musculoskeletal: He exhibits no edema.  Lymphadenopathy: none  Neurological:    Alert and oriented. Appropriate conversation.  Moved all 4's with 3+ to 4/5 strength. Sensed pain in all 4 limbs.     Psychiatric:  Generally  alert and  appropriate.  Assessment/Plan: 1. 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 Physiatrist is providing close team supervision and 24 hour management of active medical problems listed below. Physiatrist and rehab team continue to assess barriers to discharge/monitor patient progress toward functional and medical goals.  Family ed today? Dc in am tomorrow   FIM: FIM - Bathing Bathing Steps Patient Completed: Chest;Right Arm;Left Arm;Abdomen;Left upper leg;Right upper leg;Buttocks;Left lower leg (including foot);Right lower leg (including foot);Front perineal area Bathing: 5: Set-up assist to: Adjust water temp  FIM - Upper Body Dressing/Undressing Upper body dressing/undressing steps patient completed: Thread/unthread right sleeve of pullover shirt/dresss;Thread/unthread left sleeve of pullover shirt/dress;Put head through opening of pull over shirt/dress;Pull shirt over trunk Upper body dressing/undressing: 5: Supervision: Safety issues/verbal cues FIM - Lower Body Dressing/Undressing Lower body dressing/undressing steps patient completed: Thread/unthread right underwear leg;Thread/unthread left underwear leg;Pull underwear up/down;Thread/unthread right pants leg;Thread/unthread left pants leg;Pull pants up/down;Don/Doff left shoe;Don/Doff right shoe;Don/Doff right sock;Don/Doff left sock Lower body dressing/undressing: 5: Supervision: Safety issues/verbal cues  FIM - Toileting Toileting steps completed by patient: Adjust clothing prior to toileting;Performs perineal hygiene;Adjust clothing after toileting Toileting Assistive Devices: Grab bar or rail for support Toileting: 5: Supervision: Safety issues/verbal cues  FIM - Diplomatic Services operational officer Devices: Grab bars;Walker Toilet Transfers: 5-To toilet/BSC: Supervision (verbal cues/safety  issues);5-From toilet/BSC: Supervision (verbal cues/safety issues)  FIM - Bed/Chair Transfer Bed/Chair Transfer: 5: Bed > Chair or W/C: Supervision (verbal cues/safety  issues);5: Chair or W/C > Bed: Supervision (verbal cues/safety issues)  FIM - Locomotion: Wheelchair Distance: 100' Locomotion: Wheelchair: 2: Travels 50 - 149 ft with supervision, cueing or coaxing FIM - Locomotion: Ambulation Ambulation/Gait Assistance: 3: Mod assist Locomotion: Ambulation: 5: Travels 150 ft or more with supervision/safety issues  Comprehension Comprehension Mode: Auditory Comprehension: 5-Understands basic 90% of the time/requires cueing < 10% of the time  Expression Expression Mode: Verbal Expression: 5-Expresses basic needs/ideas: With extra time/assistive device  Social Interaction Social Interaction: 5-Interacts appropriately 90% of the time - Needs monitoring or encouragement for participation or interaction.  Problem Solving Problem Solving: 4-Solves basic 75 - 89% of the time/requires cueing 10 - 24% of the time  Memory Memory: 4-Recognizes or recalls 75 - 89% of the time/requires cueing 10 - 24% of the time  Medical Problem List and Plan:  1. DVT Prophylaxis/Anticoagulation: Mechanical: Sequential compression devices, below knee Bilateral lower extremities  2. Pain Management: monitor for now. rx pain but want to limit neurosedating meds as possible.  3. 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 Continue to follow  4. Neuropsych: This patient is not capable of making decisions on his/her own behalf.   -increased ritalin to 10mg --this seems to have 5. 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  . Increasd Vasotec, continue norvasc, Labetalol, -diuretics dc'ed due to hyponatremia  -has shown some improvement with recent changes---follow for now. 6. Hyponatremia: sodium improving after dc of diuretic- recheck monday 7. Hypokalemia: supplement   8. Seizure  prophylaxis: changed to tegretol yesterday---increased to 200mg  bid to help with headaches also 9. Dizziness:   ?orthostasis.   Have set parameters on BP medicaitons.  10. Constipation :   bowel program.    LOS (Days) 10 A FACE TO FACE EVALUATION WAS PERFORMED  Tarek Cravens T 07/03/2012 8:26 AM

## 2012-07-03 NOTE — Progress Notes (Signed)
Physical Therapy Note  Patient Details  Name: Luis Oconnor MRN: [id number] Date of Birth: July 22, 1955 Today's Date: 07/03/2012  4098-1191 (40 minutes) individual Pain: no complaint of pain Focus of treatment: therapeutic activities focused on standing balance; therapeutic exercise focused on activity tolerance Treatment: gait to/from room 120 feet SBA RW with max vcs for direction finding and vcs to locate sign on left; Kinetron in standing 3 x 25 reps with/without UE support SBA; Nustep Level 5 X 10 minutes for activity tolerance.     Adali Pennings,JIM 07/03/2012, 3:49 PM

## 2012-07-03 NOTE — Progress Notes (Signed)
Physical Therapy Note  Patient Details  Name: Luis Oconnor MRN: [id number] Date of Birth: [birth year] Today's Date: 07/03/2012  Time 1: 1000-1042 42 minutes  1:1 No c/o pain.  Treatment session focused on pt/family ed with pt's sister.   Pt/sister safely demo'd gait with RW, ramp/curb negotiation with RW, stair negotiation, car transfers, functional transfers all with supervision level assist.  Pt/sister educated on and expressed understanding of follow up recommendations, home safety and home exercise program.  Pt's sister educated on and safely performed gait without AD at close supervision-min A.  Both understand that RW should be used at all times at this time for safety and balance.  Time 2: 1400-1430 30 minutes  1:1 No c/o pain.  Treatment session focused on balance training.  [place] exercises performed with min visual cues for following program.  Standing balance training with ball bounce while walking with steadying assist.  Dynamic gait with pt unable to alter gait speed, requires min A for direction changes.  Pt reports he feels ready for d/c home tomorrow.   [name] 07/03/2012, 10:42 AM

## 2012-07-03 NOTE — Progress Notes (Signed)
Occupational Therapy Discharge Summary  Patient Details  Name: Luis Oconnor MRN: [id number] Date of Birth: [birth year]  Today's Date: 07/03/2012 Time: 1115-1200 Time Calculation (min): 45 min  GRAD DAY! 1:1 self care retraining at shower level down in ADL apartment with focus on family education with pt's sister Dennie Bible. Focus on how to provide therapeutic environment, types of cuing, safety with functional mobility with RW, reason for RW, visual perceptual challenges pt has and how to help him be successful with tasks, simple problem solving, transfer into tub shower with supervision with tub bench, home environment setup.   Patient has met 13 of 13 long term goals due to improved activity tolerance, improved balance, postural control, ability to compensate for deficits, improved attention, improved awareness and improved coordination.  Patient to discharge at overall Supervision level.  Patient's care partner is independent to provide the necessary physical and cognitive assistance at discharge.    Reasons goals not met: n/a  Recommendation:  Patient will benefit from ongoing skilled OT services in home health setting to continue to advance functional skills in the area of BADL and Vocation.  Equipment: tub bench  Reasons for discharge: treatment goals met and discharge from hospital  Patient/family agrees with progress made and goals achieved: Yes  OT Discharge Precautions/Restrictions  Precautions Precautions: Fall Precaution Comments: visual and percepual deficits Restrictions Weight Bearing Restrictions: No Pain Pain Assessment Pain Assessment: 0-10 Pain Score:   5 Pain Type: Acute pain Pain Location: Head Pain Orientation: Right Pain Descriptors: Headache Pain Frequency: Occasional Pain Onset: Gradual Patients Stated Pain Goal: 2 Pain Intervention(s): Medication (See eMAR) (oxycodone 5 mg po) ADL  see FIM Vision/Perception  Vision - History Baseline Vision: Wears  glasses only for reading Patient Visual Report: Peripheral vision impairment;Blurring of vision Vision - Assessment Vision Assessment: Vision tested Alignment/Gaze Preference: Gaze right Tracking/Visual Pursuits: Decreased smoothness of horizontal tracking;Decreased smoothness of vertical tracking Saccades: Additional eye shifts occurred during testing;Additional head turns occurred during testing;Decreased speed of saccadic movement Convergence: Impaired (comment) Visual Fields: Left visual field deficit Perception Perception: Impaired Inattention/Neglect: Does not attend to left visual field Praxis Praxis: Intact  Cognition Overall Cognitive Status: Impaired Arousal/Alertness: Awake/alert Orientation Level: Oriented to person;Oriented to place;Oriented to situation;Disoriented to time Attention: Selective Focused Attention: Appears intact Sustained Attention: Appears intact Selective Attention: Appears intact Memory: Impaired Memory Impairment: Decreased recall of new information;Decreased short term memory Awareness: Impaired Awareness Impairment: Anticipatory impairment Problem Solving: Impaired Problem Solving Impairment: Verbal complex;Functional complex Executive Function: Organizing;Initiating;Self Monitoring;Self Correcting Organizing: Impaired Organizing Impairment: Verbal basic;Functional basic Decision Making: Impaired Decision Making Impairment: Verbal basic;Functional basic Initiating: Impaired Initiating Impairment: Verbal basic;Functional basic Self Monitoring: Impaired Self Monitoring Impairment: Verbal basic;Functional basic Self Correcting: Impaired Self Correcting Impairment: Verbal basic;Functional basic Safety/Judgment: Impaired Sensation Sensation Light Touch: Appears Intact Stereognosis: Appears Intact Hot/Cold: Appears Intact Proprioception: Appears Intact Coordination Gross Motor Movements are Fluid and Coordinated: Yes Fine Motor Movements  are Fluid and Coordinated: Yes Motor  Motor Motor: Within Functional Limits Motor - Discharge Observations: still weak but improved since eval Mobility  Bed Mobility Sit to Supine: 5: Supervision Transfers Sit to Stand: 5: Supervision Stand to Sit: 5: Supervision  Trunk/Postural Assessment  Cervical Assessment Cervical Assessment: Within Functional Limits Thoracic Assessment Thoracic Assessment: Within Functional Limits Lumbar Assessment Lumbar Assessment: Within Functional Limits Postural Control Postural Control: Within Functional Limits  Balance Static Standing Balance Static Standing - Level of Assistance: 5: Stand by assistance Extremity/Trunk Assessment RUE Assessment RUE Assessment: Within Functional Limits  LUE Assessment LUE Assessment: Within Functional Limits  See FIM for current functional status  Roney [facility] 07/03/2012, 12:19 PM

## 2012-07-03 NOTE — Progress Notes (Signed)
Speech Language Pathology Session Note & Discharge Summary  Patient Details  Name: Luis Oconnor MRN: [id number] Date of Birth: 10-21-1955  Today's Date: 07/03/2012 Time: 4098-1191 Time Calculation (min): 30 min  Skilled Therapeutic Intervention: Treatment focus on family education with pt's sister in regards to his current cognitive function and strategies to utilize at home to increase working memory, attention, initiation, problem solving, awareness, attention to left field of environment with functional tasks and overall safety. Pt's sister also provided handouts and a 4X/day pill box to increase safety with medication management. Pt and his sister verbalized and demonstrated understanding of all information provided.   Patient has met 4 of 4 long term goals.  Patient to discharge at overall Min;Supervision level.   Reasons goals not met: N/A   Clinical Impression/Discharge Summary: Pt has made functional gains and has met all LTG's this admission due to improved attention, working memory with utilization of compensatory strategies, initiation, problem solving, awareness and attention to left field of environment.  Pt will discharge at a supervision-min A level in regards to cognitive function with basic and familiar tasks. Pt/family education complete and pt will discharge home with 24 hour supervision from family. Recommend f/u outpatient skilled SLP intervention to maximize cognitive recovery and overall functional independence.   Care Partner:  Caregiver Able to Provide Assistance: Yes  Type of Caregiver Assistance: Cognitive;Physical  Recommendation:  Outpatient SLP;24 hour supervision/assistance  Rationale for SLP Follow Up: Maximize cognitive function and independence;Reduce caregiver burden   Equipment: N/A   Reasons for discharge: Discharged from hospital;Treatment goals met   Patient/Family Agrees with Progress Made and Goals Achieved: Yes   See FIM for current functional  status  Jaeanna Mccomber 07/03/2012, 1:14 PM  Feliberto Gottron, MA, CCC-SLP [phone]

## 2012-07-03 NOTE — Progress Notes (Signed)
Physical Therapy Discharge Summary  Patient Details  Name: Luis Oconnor MRN: [id number] Date of Birth: 10-Nov-1955  Today's Date: 07/03/2012  Patient has met 8 of 8 long term goals due to improved activity tolerance, improved balance, improved postural control, increased strength, ability to compensate for deficits, improved attention and improved awareness.  Patient to discharge at an ambulatory level Supervision with RW.  Pt requires assist due to L inattention and visual/perceptual deficits as well as decreased initiation, problem solving and awareness.  Patient's care partner is independent to provide the necessary cognitive assistance at discharge.  Reasons goals not met: n/a  Recommendation:  Patient will benefit from ongoing skilled PT services in home health setting to continue to advance safe functional mobility, address ongoing impairments in balance, gait, activity tolerance, awareness, and minimize fall risk.  Equipment: RW  Reasons for discharge: treatment goals met and discharge from hospital  Patient/family agrees with progress made and goals achieved: Yes  PT Discharge  Cognition Overall Cognitive Status: Impaired Attention: Selective Selective Attention: Appears intact Memory: Impaired Memory Impairment: Decreased recall of new information Awareness: Impaired Awareness Impairment: Anticipatory impairment Problem Solving: Impaired Problem Solving Impairment: Functional basic Sensation Sensation Light Touch: Appears Intact Proprioception: Appears Intact Coordination Gross Motor Movements are Fluid and Coordinated: Yes Motor  Motor Motor: Within Functional Limits Motor - Discharge Observations: still weak but improved since eval   Trunk/Postural Assessment  Cervical Assessment Cervical Assessment: Within Functional Limits Thoracic Assessment Thoracic Assessment: Within Functional Limits Lumbar Assessment Lumbar Assessment: Within Functional  Limits Postural Control Postural Control:  (delayed balance reactions)  Balance Static Standing Balance Static Standing - Level of Assistance: 5: Stand by assistance Berg balance score 44/56 Extremity Assessment      RLE Assessment RLE Assessment: Within Functional Limits LLE Assessment LLE Assessment: Within Functional Limits  See FIM for current functional status  [name] 07/03/2012, 11:31 AM

## 2012-07-04 DIAGNOSIS — I10 Essential (primary) hypertension: Secondary | ICD-10-CM

## 2012-07-04 DIAGNOSIS — I619 Nontraumatic intracerebral hemorrhage, unspecified: Secondary | ICD-10-CM

## 2012-07-04 NOTE — Progress Notes (Signed)
Patient ID: Luis Oconnor, male   DOB: 10/06/1955, 57 y.o.   MRN: [id number] Subjective/Complaints: Headache is better. Up and alert this am. Awaiting breakfast.  A 12 point review of systems has been performed and if not noted above is otherwise negative.   Objective: Vital Signs: Blood pressure 140/87, pulse 58, temperature 98.3 F (36.8 C), temperature source Oral, resp. rate 20, height 5\' 6"  (1.676 m), weight 62.052 kg (136 lb 12.8 oz), SpO2 100.00%. No results found. No results found for this basename: WBC, HGB, HCT, PLT,  in the last 72 hours No results found for this basename: NA, K, CL, CO, GLUCOSE, BUN, CREATININE, CALCIUM,  in the last 72 hours CBG (last 3)  No results found for this basename: GLUCAP,  in the last 72 hours  Wt Readings from Last 3 Encounters:  07/01/12 62.052 kg (136 lb 12.8 oz)  06/20/12 66.5 kg (146 lb 9.7 oz)  06/20/12 66.5 kg (146 lb 9.7 oz)    Physical Exam:  Constitutional: He appears well-nourished.   HENT:  Head: Normocephalic and atraumatic.  Right Ear: External ear normal.  Left Ear: External ear normal.  Nose: Nose normal.  Mouth/Throat: Oropharynx is clear and moist.  Right crani incision intact.  Eyes: Conjunctivae are normal. Pupils are equal, round, and reactive to light. Right eye exhibits no discharge. Left eye exhibits no discharge.  Neck: Normal range of motion. No JVD present. No tracheal deviation present. No thyromegaly present.  Cardiovascular: Normal rate and regular rhythm. Exam reveals no gallop and no friction rub.  No murmur heard.  Pulmonary/Chest: Effort normal. No respiratory distress. He has no wheezes. He has no rales. He exhibits no tenderness.  Abdominal: Soft. Bowel sounds are normal. He exhibits no distension. There is no tenderness. There is no rebound.  Musculoskeletal: He exhibits no edema.  Lymphadenopathy: none  Neurological:    Alert and oriented. Appropriate conversation.  Moved all 4's with 3+ to 4/5  strength. Sensed pain in all 4 limbs.     Psychiatric:  Generally  alert and appropriate.  Assessment/Plan: 1. 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 Stable for D/C today F/u PCP in 1-2 weeks F/u PM&R 3 weeks See D/C summary See D/C instructions  FIM: FIM - Bathing Bathing Steps Patient Completed: Chest;Right Arm;Left Arm;Abdomen;Left upper leg;Right upper leg;Buttocks;Left lower leg (including foot);Right lower leg (including foot);Front perineal area Bathing: 5: Set-up assist to: Adjust water temp  FIM - Upper Body Dressing/Undressing Upper body dressing/undressing steps patient completed: Thread/unthread right sleeve of pullover shirt/dresss;Thread/unthread left sleeve of pullover shirt/dress;Put head through opening of pull over shirt/dress;Pull shirt over trunk Upper body dressing/undressing: 5: Supervision: Safety issues/verbal cues FIM - Lower Body Dressing/Undressing Lower body dressing/undressing steps patient completed: Thread/unthread right underwear leg;Thread/unthread left underwear leg;Pull underwear up/down;Thread/unthread right pants leg;Thread/unthread left pants leg;Pull pants up/down;Don/Doff left shoe;Don/Doff right shoe;Don/Doff right sock;Don/Doff left sock Lower body dressing/undressing: 5: Supervision: Safety issues/verbal cues  FIM - Toileting Toileting steps completed by patient: Adjust clothing prior to toileting;Performs perineal hygiene;Adjust clothing after toileting Toileting Assistive Devices: Grab bar or rail for support Toileting: 5: Supervision: Safety issues/verbal cues  FIM - Diplomatic Services operational officer Devices: Grab bars;Walker Toilet Transfers: 5-To toilet/BSC: Supervision (verbal cues/safety issues);5-From toilet/BSC: Supervision (verbal cues/safety issues)  FIM - Bed/Chair Transfer Bed/Chair Transfer: 5: Supine > Sit: Supervision (verbal cues/safety issues);5: Bed > Chair or W/C: Supervision (verbal  cues/safety issues)  FIM - Locomotion: Wheelchair Distance: 100' Locomotion: Wheelchair: 2: Travels 50 - 149  ft with supervision, cueing or coaxing FIM - Locomotion: Ambulation Ambulation/Gait Assistance: 3: Mod assist Locomotion: Ambulation: 5: Travels 150 ft or more with supervision/safety issues  Comprehension Comprehension Mode: Auditory Comprehension: 5-Follows basic conversation/direction: With extra time/assistive device  Expression Expression Mode: Verbal Expression: 5-Expresses basic needs/ideas: With extra time/assistive device  Social Interaction Social Interaction: 5-Interacts appropriately 90% of the time - Needs monitoring or encouragement for participation or interaction.  Problem Solving Problem Solving: 4-Solves basic 75 - 89% of the time/requires cueing 10 - 24% of the time  Memory Memory: 4-Recognizes or recalls 75 - 89% of the time/requires cueing 10 - 24% of the time  Medical Problem List and Plan:  1. DVT Prophylaxis/Anticoagulation: Mechanical: Sequential compression devices, below knee Bilateral lower extremities  2. Pain Management: monitor for now. rx pain but want to limit neurosedating meds as possible.  3. 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 Continue to follow  4. Neuropsych: This patient is not capable of making decisions on his/her own behalf.   -increased ritalin to 10mg --this seems to have 5. 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  . Increasd Vasotec, continue norvasc, Labetalol, -diuretics dc'ed due to hyponatremia  -has shown some improvement with recent changes---follow for now. 6. Hyponatremia: sodium improving after dc of diuretic- recheck monday 7. Hypokalemia: supplement   8. Seizure prophylaxis: changed to tegretol yesterday---increased to 200mg  bid to help with headaches also 9. Dizziness:   ?orthostasis.   Have set parameters on BP medicaitons.  10. Constipation :   bowel program.    LOS (Days)  11 A FACE TO FACE EVALUATION WAS PERFORMED  KIRSTEINS,ANDREW E 07/04/2012 8:42 AM

## 2012-07-04 NOTE — Progress Notes (Signed)
Pt discharged at 0915 accompanied by sister. Pt and sister stated they understood instructions presented by PA yesterday and have no questions at this time. Pt given oxy IR 10mg  PO upon leaving to treat headache 5/10. Mick Sell RN

## 2012-07-06 NOTE — Progress Notes (Signed)
Social Work  Discharge Note  The overall goal for the admission was met for:   Discharge location: Yes - home with girlfriend and siblings providing 24/7 supervision  Length of Stay: Yes - 11 days  Discharge activity level: Yes - supervision  Home/community participation: Yes  Services provided included: MD, RD, PT, OT, SLP, RN, TR, Pharmacy, Neuropsych and SW  Financial Services: Medicaid  ** application pending  Follow-up services arranged: Outpatient: PT, OT, ST via [facility] and DME: rolling walker, tub bench via [facility]  Comments (or additional information): Connected pt with [facility] medication assist program and provided information on local "Free" medical clinics and Community Care (orange card) program.  Sister reports full understanding of application process for these programs.  Patient/Family verbalized understanding of follow-up arrangements: Yes  Individual responsible for coordination of the follow-up plan: sister  Confirmed correct DME delivered: Silveria Botz 07/06/2012    Sharonne Ricketts

## 2012-07-07 ENCOUNTER — Ambulatory Visit: Payer: Medicaid Other

## 2012-07-07 ENCOUNTER — Ambulatory Visit: Payer: Medicaid Other | Attending: Physical Medicine & Rehabilitation | Admitting: Occupational Therapy

## 2012-07-07 DIAGNOSIS — Z5189 Encounter for other specified aftercare: Secondary | ICD-10-CM | POA: Insufficient documentation

## 2012-07-07 DIAGNOSIS — I69919 Unspecified symptoms and signs involving cognitive functions following unspecified cerebrovascular disease: Secondary | ICD-10-CM | POA: Insufficient documentation

## 2012-07-07 DIAGNOSIS — R279 Unspecified lack of coordination: Secondary | ICD-10-CM | POA: Insufficient documentation

## 2012-07-07 DIAGNOSIS — R41841 Cognitive communication deficit: Secondary | ICD-10-CM | POA: Insufficient documentation

## 2012-07-07 DIAGNOSIS — R269 Unspecified abnormalities of gait and mobility: Secondary | ICD-10-CM | POA: Insufficient documentation

## 2012-07-09 ENCOUNTER — Ambulatory Visit: Payer: Medicaid Other | Admitting: *Deleted

## 2012-07-09 ENCOUNTER — Ambulatory Visit: Payer: Medicaid Other | Admitting: Physical Therapy

## 2012-07-13 ENCOUNTER — Ambulatory Visit: Payer: Medicaid Other | Admitting: Physical Therapy

## 2012-07-15 ENCOUNTER — Emergency Department (HOSPITAL_COMMUNITY): Payer: Medicaid Other

## 2012-07-15 ENCOUNTER — Encounter (HOSPITAL_COMMUNITY): Payer: Self-pay | Admitting: Emergency Medicine

## 2012-07-15 ENCOUNTER — Ambulatory Visit: Payer: Medicaid Other | Admitting: Physical Therapy

## 2012-07-15 ENCOUNTER — Ambulatory Visit: Payer: Medicaid Other | Admitting: Speech Pathology

## 2012-07-15 ENCOUNTER — Emergency Department (HOSPITAL_COMMUNITY)
Admission: EM | Admit: 2012-07-15 | Discharge: 2012-07-15 | Disposition: A | Discharge status: Home / Self Care | Payer: Medicaid Other | Attending: Emergency Medicine | Admitting: Emergency Medicine

## 2012-07-15 DIAGNOSIS — I10 Essential (primary) hypertension: Secondary | ICD-10-CM | POA: Insufficient documentation

## 2012-07-15 DIAGNOSIS — Z79899 Other long term (current) drug therapy: Secondary | ICD-10-CM | POA: Insufficient documentation

## 2012-07-15 DIAGNOSIS — T148XXA Other injury of unspecified body region, initial encounter: Secondary | ICD-10-CM

## 2012-07-15 LAB — BASIC METABOLIC PANEL
BUN: 15 mg/dL (ref 6–23)
Chloride: 101 mEq/L (ref 96–112)
Creatinine, Ser: 0.89 mg/dL (ref 0.50–1.35)
GFR calc Af Amer: >90 mL/min (ref >90)
Glucose, Bld: 88 mg/dL (ref 70–99)

## 2012-07-15 MED ORDER — LISINOPRIL 10 MG PO TABS
10.0000 mg | ORAL_TABLET | Freq: Once | ORAL | Status: AC
  Administered 2012-07-15: 10 mg via ORAL
  Filled 2012-07-15: qty 1

## 2012-07-15 MED ORDER — LABETALOL HCL 100 MG PO TABS: 200.0000 mg | ORAL_TABLET | Freq: Three times a day (TID) | ORAL | Status: DC

## 2012-07-15 MED ORDER — ENALAPRIL MALEATE 10 MG PO TABS: 20.0000 mg | ORAL_TABLET | Freq: Two times a day (BID) | ORAL | Status: DC

## 2012-07-15 MED ORDER — AMLODIPINE BESYLATE 10 MG PO TABS: 10.0000 mg | ORAL_TABLET | Freq: Every day | ORAL | Status: DC

## 2012-07-15 MED ORDER — LABETALOL HCL 100 MG PO TABS
100.0000 mg | ORAL_TABLET | Freq: Once | ORAL | Status: AC
  Administered 2012-07-15: 100 mg via ORAL
  Filled 2012-07-15: qty 1

## 2012-07-15 NOTE — ED Notes (Signed)
Called CT status of test. Stated order not in computer even though it was ordered. Cancelled and reordered test.  Spoke with CT stated order now appears in computer.

## 2012-07-15 NOTE — ED Notes (Signed)
Patient states in physical rehab and blood pressure increasing since Monday.  Denies pain or complaints from high blood pressure.  Concerned because recently has brain surgery discharged 07/04/2012. Alert answering and following commands appropriate.

## 2012-07-15 NOTE — ED Provider Notes (Addendum)
History     CSN: [id number]  Arrival date & time 07/15/12  1015   First MD Initiated Contact with Patient 07/15/12 1100      Chief Complaint  Patient presents with  . Hypertension    (Consider location/radiation/quality/duration/timing/severity/associated sxs/prior treatment) Patient is a 57 y.o. male presenting with hypertension. The history is provided by the patient.  Hypertension   patient here after having an elevated blood pressure reading while at rehabilitation. He is asymptomatic with this. Denies any headaches, neck pain, chest pain, abdominal pain. No syncope or near-syncope. Saw his neurosurgeon and does have a head CT ordered as an outpatient be done this week. Does not have a primary care Dr. this time to manage his blood pressure which was managed by the rehabilitation physician. Patient feels that his baseline  Past Medical History  Diagnosis Date  . Hypertension   . Ulcer     Past Surgical History  Procedure Laterality Date  . Craniotomy Right 06/14/2012    Procedure: CRANIOTOMY HEMATOMA EVACUATION SUBDURAL;  Surgeon: [name], MD;  Location: [facility];  Service: Neurosurgery;  Laterality: Right;    Family History  Problem Relation Age of Onset  . Hypertension Mother   . Hypertension Father   . Cancer Brother     leukemia  . Renal Disease Brother     History  Substance Use Topics  . Smoking status: Never Smoker   . Smokeless tobacco: Not on file  . Alcohol Use: No      Review of Systems  All other systems reviewed and are negative.    Allergies  Clonidine derivatives  Home Medications   Current Outpatient Rx  Name  Route  Sig  Dispense  Refill  . amLODipine (NORVASC) 10 MG tablet   Oral   Take 1 tablet (10 mg total) by mouth daily.   30 tablet   1   . carbamazepine (TEGRETOL XR) 200 MG 12 hr tablet   Oral   Take 1 tablet (200 mg total) by mouth 2 (two) times daily.   60 tablet   1   . enalapril (VASOTEC) 10 MG  tablet   Oral   Take 1 tablet (10 mg total) by mouth 2 (two) times daily.   60 tablet   1   . labetalol (NORMODYNE) 100 MG tablet   Oral   Take 1 tablet (100 mg total) by mouth 3 (three) times daily.   90 tablet   1   . methylphenidate (RITALIN) 10 MG tablet   Oral   Take 1 tablet (10 mg total) by mouth 2 (two) times daily with breakfast and lunch.   60 tablet   0   . Multiple Vitamins-Minerals (MULTIVITAMIN) tablet   Oral   Take 1 tablet by mouth daily.         Marland Kitchen oxyCODONE (OXY IR/ROXICODONE) 5 MG immediate release tablet   Oral   Take 5 mg by mouth every 4 (four) hours as needed (headache).         . polyethylene glycol (MIRALAX / GLYCOLAX) packet   Oral   Take 17 g by mouth 2 (two) times daily as needed. For constipation   14 each        BP 177/105  Pulse 73  Temp(Src) 97.6 F (36.4 C) (Oral)  Resp 16  SpO2 97%  Physical Exam  Nursing note and vitals reviewed. Constitutional: He is oriented to person, place, and time. He appears well-developed and well-nourished.  Non-toxic appearance. No distress.  HENT:  Head: Normocephalic and atraumatic.  Eyes: Conjunctivae, EOM and lids are normal. Pupils are equal, round, and reactive to light.  Neck: Normal range of motion. Neck supple. No tracheal deviation present. No mass present.  Cardiovascular: Normal rate, regular rhythm and normal heart sounds.  Exam reveals no gallop.   No murmur heard. Pulmonary/Chest: Effort normal and breath sounds normal. No stridor. No respiratory distress. He has no decreased breath sounds. He has no wheezes. He has no rhonchi. He has no rales.  Abdominal: Soft. Normal appearance and bowel sounds are normal. He exhibits no distension. There is no tenderness. There is no rebound and no CVA tenderness.  Musculoskeletal: Normal range of motion. He exhibits no edema and no tenderness.  Neurological: He is alert and oriented to person, place, and time. No cranial nerve deficit. GCS eye  subscore is 4. GCS verbal subscore is 5. GCS motor subscore is 6.  Neuro exam is at baseline per patient  Skin: Skin is warm and dry. No abrasion and no rash noted.  Psychiatric: He has a normal mood and affect. His speech is normal and behavior is normal.    ED Course  Procedures (including critical care time)  Labs Reviewed  BASIC METABOLIC PANEL   No results found.   1. Hematoma   2. S/P craniotomy       MDM  Pt without primary care followup at this time. Have given patient doses of his antihypertensive medications here. I'll make an adjustment of those medications. He remains asymptomatic at this time. Neurological exam is stable. He is okay for discharge        Toy Baker, MD 07/15/12 1439  Toy Baker, MD 07/15/12 [phone]

## 2012-07-15 NOTE — ED Notes (Signed)
MD aware of BPs ranges

## 2012-07-21 ENCOUNTER — Ambulatory Visit: Payer: Medicaid Other

## 2012-07-21 ENCOUNTER — Ambulatory Visit: Payer: Medicaid Other | Admitting: Occupational Therapy

## 2012-07-24 ENCOUNTER — Encounter: Payer: Self-pay | Admitting: Occupational Therapy

## 2012-07-24 ENCOUNTER — Ambulatory Visit: Payer: Self-pay | Admitting: Physical Therapy

## 2012-07-28 ENCOUNTER — Encounter: Payer: Self-pay | Admitting: Physical Medicine & Rehabilitation

## 2012-07-28 ENCOUNTER — Encounter: Payer: Medicaid Other | Attending: Physical Medicine & Rehabilitation | Admitting: Physical Medicine & Rehabilitation

## 2012-07-28 VITALS — BP 171/100 | HR 60 | Resp 16 | Ht 66.0 in | Wt 147.0 lb

## 2012-07-28 DIAGNOSIS — I619 Nontraumatic intracerebral hemorrhage, unspecified: Secondary | ICD-10-CM | POA: Insufficient documentation

## 2012-07-28 DIAGNOSIS — G8190 Hemiplegia, unspecified affecting unspecified side: Secondary | ICD-10-CM | POA: Insufficient documentation

## 2012-07-28 DIAGNOSIS — I10 Essential (primary) hypertension: Secondary | ICD-10-CM

## 2012-07-28 DIAGNOSIS — H53469 Homonymous bilateral field defects, unspecified side: Secondary | ICD-10-CM | POA: Insufficient documentation

## 2012-07-28 DIAGNOSIS — R279 Unspecified lack of coordination: Secondary | ICD-10-CM | POA: Insufficient documentation

## 2012-07-28 MED ORDER — HYDROCHLOROTHIAZIDE 25 MG PO TABS: 25.0000 mg | ORAL_TABLET | Freq: Every day | ORAL | Status: DC

## 2012-07-28 NOTE — Patient Instructions (Signed)
Need to eat plenty of fruit, especially bananas, foods containing potassium  Drink plenty of water.

## 2012-07-28 NOTE — Progress Notes (Signed)
Subjective:    Patient ID: Luis Oconnor, male    DOB: 1956-03-07, 57 y.o.   MRN: [id number]  HPI  Luis Oconnor is back regarding his IPH. He has been involved in outpt therapies to work on balance, coordination, etc.   His BP has been elevated. He was in the ED last week because of this and his meds were adjusted. His meds were adjusted including his norvasc, vasotec, labetalol.   He continues to take the ritalin for attention and initiation. He as improved in these areas since leaving home. He struggles with memory sometimes. He is not having pain.   He is sleeping fairly well.   Pain Inventory Average Pain 0 Pain Right Now 0 My pain is n/a  In the last 24 hours, has pain interfered with the following? General activity 4 Relation with others 10 Enjoyment of life 10 What TIME of day is your pain at its worst? n/a Sleep (in general) n/a  Pain is worse with: walking Pain improves with: n/a Relief from Meds: n/a  Mobility use a walker ability to climb steps?  yes do you drive?  no  Function not employed: date last employed Aug 13 2011  Neuro/Psych No problems in this area  Prior Studies Any changes since last visit?  no  Physicians involved in your care Any changes since last visit?  no   Family History  Problem Relation Age of Onset  . Hypertension Mother   . Hypertension Father   . Cancer Brother     leukemia  . Renal Disease Brother    History   Social History  . Marital Status: Single    Spouse Name: N/A    Number of Children: N/A  . Years of Education: N/A   Social History Main Topics  . Smoking status: Never Smoker   . Smokeless tobacco: None  . Alcohol Use: No  . Drug Use: No  . Sexually Active: None   Other Topics Concern  . None   Social History Narrative  . None   Past Surgical History  Procedure Laterality Date  . Craniotomy Right 06/14/2012    Procedure: CRANIOTOMY HEMATOMA EVACUATION SUBDURAL;  Surgeon: [name], MD;   Location: [facility];  Service: Neurosurgery;  Laterality: Right;   Past Medical History  Diagnosis Date  . Hypertension   . Ulcer    BP 171/100  Pulse 60  Resp 16  Ht 5\' 6"  (1.676 m)  Wt 147 lb (66.679 kg)  BMI 23.74 kg/m2  SpO2 98%     Review of Systems  Constitutional: Positive for unexpected weight change.  All other systems reviewed and are negative.       Objective:   Physical Exam   General: Alert and oriented x 3, No apparent distress HEENT: Head is normocephalic, atraumatic, PERRLA, EOMI, sclera anicteric, oral mucosa pink and moist, dentition intact, ext ear canals clear,  Neck: Supple without JVD or lymphadenopathy Heart: Reg rate and rhythm. No murmurs rubs or gallops Chest: CTA bilaterally without wheezes, rales, or rhonchi; no distress Abdomen: Soft, non-tender, non-distended, bowel sounds positive. Extremities: No clubbing, cyanosis, or edema. Pulses are 2+ Skin: Clean and intact without signs of breakdown Neuro: Alert and oriented x3. Left UQ visual field loss. Mild left HP, 4/5 on left. Tends to shuffle left foot with gait and lean to the left with ambulation. Attention and focus generally good. Can be alittle slow with initiation.   Musculoskeletal: Full ROM, No pain with  AROM or PROM in the neck, trunk, or extremities. Posture appropriate Psych: Pt's affect is appropriate. Pt is cooperative        Assessment & Plan:  1. 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 2. HTN, malignant    Plan: 1. Wean ritalin to off over the next 10 days. 2. Follow up with PCP regarding htn. Will initiate HCTZ 25mg  qday to help. Discussed appropriate fluid intake and potassium. 3. Resume therapy as possible 4. I will follow up with pt in about 2 months.

## 2012-07-29 ENCOUNTER — Ambulatory Visit: Payer: Medicaid Other | Admitting: Physical Therapy

## 2012-07-29 ENCOUNTER — Encounter: Payer: Self-pay | Admitting: Occupational Therapy

## 2012-07-29 ENCOUNTER — Ambulatory Visit (HOSPITAL_COMMUNITY): Payer: Self-pay

## 2012-07-31 ENCOUNTER — Encounter: Payer: Self-pay | Admitting: Occupational Therapy

## 2012-07-31 ENCOUNTER — Ambulatory Visit: Payer: Self-pay | Admitting: Physical Therapy

## 2012-08-03 ENCOUNTER — Ambulatory Visit: Payer: Self-pay

## 2012-08-03 ENCOUNTER — Ambulatory Visit: Payer: Self-pay | Admitting: Occupational Therapy

## 2012-08-03 ENCOUNTER — Ambulatory Visit: Payer: Self-pay | Attending: Physical Medicine & Rehabilitation | Admitting: Physical Therapy

## 2012-08-03 DIAGNOSIS — R41841 Cognitive communication deficit: Secondary | ICD-10-CM | POA: Insufficient documentation

## 2012-08-03 DIAGNOSIS — R269 Unspecified abnormalities of gait and mobility: Secondary | ICD-10-CM | POA: Insufficient documentation

## 2012-08-03 DIAGNOSIS — R279 Unspecified lack of coordination: Secondary | ICD-10-CM | POA: Insufficient documentation

## 2012-08-03 DIAGNOSIS — I69919 Unspecified symptoms and signs involving cognitive functions following unspecified cerebrovascular disease: Secondary | ICD-10-CM | POA: Insufficient documentation

## 2012-08-03 DIAGNOSIS — Z5189 Encounter for other specified aftercare: Secondary | ICD-10-CM | POA: Insufficient documentation

## 2012-08-05 ENCOUNTER — Encounter: Payer: Self-pay | Admitting: Occupational Therapy

## 2012-08-05 ENCOUNTER — Ambulatory Visit: Payer: Self-pay

## 2012-08-10 ENCOUNTER — Ambulatory Visit: Payer: Self-pay | Admitting: Occupational Therapy

## 2012-08-10 ENCOUNTER — Ambulatory Visit: Payer: Self-pay

## 2012-08-12 ENCOUNTER — Encounter: Payer: Self-pay | Admitting: Occupational Therapy

## 2012-08-12 ENCOUNTER — Ambulatory Visit: Payer: Self-pay | Admitting: Physical Therapy

## 2012-08-17 ENCOUNTER — Ambulatory Visit: Payer: Self-pay | Admitting: Physical Therapy

## 2012-08-17 ENCOUNTER — Encounter: Payer: Self-pay | Admitting: Occupational Therapy

## 2012-08-19 ENCOUNTER — Ambulatory Visit: Payer: Self-pay | Admitting: Physical Therapy

## 2012-08-19 ENCOUNTER — Encounter: Payer: Self-pay | Admitting: Occupational Therapy

## 2012-09-05 ENCOUNTER — Emergency Department (HOSPITAL_COMMUNITY): Payer: Medicaid Other

## 2012-09-05 ENCOUNTER — Encounter (HOSPITAL_COMMUNITY): Payer: Self-pay | Admitting: Adult Health

## 2012-09-05 ENCOUNTER — Inpatient Hospital Stay (HOSPITAL_COMMUNITY)
Admission: EM | Admit: 2012-09-05 | Discharge: 2012-09-08 | DRG: 305 | Disposition: A | Discharge status: Home / Self Care | Payer: Medicaid Other | Attending: Internal Medicine | Admitting: Internal Medicine

## 2012-09-05 DIAGNOSIS — Z87820 Personal history of traumatic brain injury: Secondary | ICD-10-CM

## 2012-09-05 DIAGNOSIS — R519 Headache, unspecified: Secondary | ICD-10-CM | POA: Diagnosis present

## 2012-09-05 DIAGNOSIS — E039 Hypothyroidism, unspecified: Secondary | ICD-10-CM | POA: Diagnosis present

## 2012-09-05 DIAGNOSIS — I619 Nontraumatic intracerebral hemorrhage, unspecified: Secondary | ICD-10-CM

## 2012-09-05 DIAGNOSIS — I498 Other specified cardiac arrhythmias: Secondary | ICD-10-CM | POA: Diagnosis present

## 2012-09-05 DIAGNOSIS — E871 Hypo-osmolality and hyponatremia: Secondary | ICD-10-CM | POA: Diagnosis present

## 2012-09-05 DIAGNOSIS — R51 Headache: Secondary | ICD-10-CM

## 2012-09-05 DIAGNOSIS — E236 Other disorders of pituitary gland: Secondary | ICD-10-CM | POA: Diagnosis present

## 2012-09-05 DIAGNOSIS — I10 Essential (primary) hypertension: Secondary | ICD-10-CM | POA: Diagnosis present

## 2012-09-05 DIAGNOSIS — Z8249 Family history of ischemic heart disease and other diseases of the circulatory system: Secondary | ICD-10-CM

## 2012-09-05 DIAGNOSIS — R631 Polydipsia: Secondary | ICD-10-CM | POA: Diagnosis present

## 2012-09-05 DIAGNOSIS — R1110 Vomiting, unspecified: Secondary | ICD-10-CM | POA: Diagnosis present

## 2012-09-05 HISTORY — DX: Cerebral aneurysm, nonruptured: I67.1

## 2012-09-05 HISTORY — DX: Epistaxis: R04.0

## 2012-09-05 LAB — CBC
HCT: 39.4 % (ref 39.0–52.0)
MCH: 29.9 pg (ref 26.0–34.0)
MCV: 76.5 fL — ABNORMAL LOW (ref 78.0–100.0)
RBC: 5.15 MIL/uL (ref 4.22–5.81)
WBC: 10.6 10*3/uL — ABNORMAL HIGH (ref 4.0–10.5)

## 2012-09-05 LAB — COMPREHENSIVE METABOLIC PANEL
ALT: 16 U/L (ref 0–53)
BUN: 12 mg/dL (ref 6–23)
CO2: 27 mEq/L (ref 19–32)
Calcium: 9.9 mg/dL (ref 8.4–10.5)
Creatinine, Ser: 0.9 mg/dL (ref 0.50–1.35)
GFR calc Af Amer: >90 mL/min (ref >90)
GFR calc non Af Amer: >90 mL/min (ref >90)
Glucose, Bld: 128 mg/dL — ABNORMAL HIGH (ref 70–99)

## 2012-09-05 LAB — DIFFERENTIAL
Eosinophils Relative: 0 % (ref 0–5)
Lymphocytes Relative: 8 % — ABNORMAL LOW (ref 12–46)
Lymphs Abs: 0.9 10*3/uL (ref 0.7–4.0)
Monocytes Absolute: 0.5 10*3/uL (ref 0.1–1.0)
Monocytes Relative: 5 % (ref 3–12)

## 2012-09-05 LAB — POCT I-STAT TROPONIN I

## 2012-09-05 LAB — TROPONIN I: Troponin I: <0.3 ng/mL (ref <0.30)

## 2012-09-05 MED ORDER — LISINOPRIL 40 MG PO TABS
40.0000 mg | ORAL_TABLET | Freq: Every day | ORAL | Status: DC
Start: 2012-09-06 — End: 2012-09-08
  Administered 2012-09-06 – 2012-09-08 (×3): 40 mg via ORAL
  Filled 2012-09-05 (×4): qty 1

## 2012-09-05 MED ORDER — SODIUM CHLORIDE 0.9 % IJ SOLN
3.0000 mL | Freq: Two times a day (BID) | INTRAMUSCULAR | Status: DC
  Administered 2012-09-06 (×2): 3 mL via INTRAVENOUS

## 2012-09-05 MED ORDER — ONDANSETRON 8 MG/NS 50 ML IVPB: 8.0000 mg | Freq: Once | INTRAVENOUS | Status: DC

## 2012-09-05 MED ORDER — ONDANSETRON HCL 4 MG/2ML IJ SOLN: 4.0000 mg | Freq: Four times a day (QID) | INTRAMUSCULAR | Status: DC | PRN

## 2012-09-05 MED ORDER — CARBAMAZEPINE ER 200 MG PO TB12
200.0000 mg | ORAL_TABLET | Freq: Two times a day (BID) | ORAL | Status: DC
  Administered 2012-09-06 – 2012-09-08 (×6): 200 mg via ORAL
  Filled 2012-09-05 (×8): qty 1

## 2012-09-05 MED ORDER — ONDANSETRON HCL 4 MG PO TABS
8.0000 mg | ORAL_TABLET | Freq: Once | ORAL | Status: AC
  Administered 2012-09-05: 8 mg via ORAL
  Filled 2012-09-05: qty 2

## 2012-09-05 MED ORDER — AMLODIPINE BESYLATE 10 MG PO TABS
10.0000 mg | ORAL_TABLET | Freq: Every day | ORAL | Status: DC
  Administered 2012-09-06 – 2012-09-08 (×3): 10 mg via ORAL
  Filled 2012-09-05 (×4): qty 1

## 2012-09-05 MED ORDER — SODIUM CHLORIDE 0.9 % IV SOLN
INTRAVENOUS | Status: DC
  Administered 2012-09-05: 21:00:00 via INTRAVENOUS

## 2012-09-05 MED ORDER — METOPROLOL TARTRATE 1 MG/ML IV SOLN
5.0000 mg | Freq: Once | INTRAVENOUS | Status: AC
  Administered 2012-09-05: 5 mg via INTRAVENOUS
  Filled 2012-09-05: qty 5

## 2012-09-05 MED ORDER — SODIUM CHLORIDE 0.9 % IJ SOLN
3.0000 mL | Freq: Two times a day (BID) | INTRAMUSCULAR | Status: DC
  Administered 2012-09-07 – 2012-09-08 (×3): 3 mL via INTRAVENOUS

## 2012-09-05 MED ORDER — PANTOPRAZOLE SODIUM 40 MG IV SOLR
40.0000 mg | Freq: Every day | INTRAVENOUS | Status: DC
  Administered 2012-09-06: 40 mg via INTRAVENOUS
  Filled 2012-09-05 (×3): qty 40

## 2012-09-05 MED ORDER — ONDANSETRON HCL 4 MG/2ML IJ SOLN: 4.0000 mg | Freq: Three times a day (TID) | INTRAMUSCULAR | Status: DC | PRN

## 2012-09-05 MED ORDER — ATENOLOL 50 MG PO TABS
50.0000 mg | ORAL_TABLET | Freq: Every day | ORAL | Status: DC
  Administered 2012-09-06 – 2012-09-08 (×3): 50 mg via ORAL
  Filled 2012-09-05 (×4): qty 1

## 2012-09-05 MED ORDER — SODIUM CHLORIDE 0.9 % IJ SOLN: 3.0000 mL | INTRAMUSCULAR | Status: DC | PRN

## 2012-09-05 MED ORDER — ONDANSETRON HCL 4 MG PO TABS: 4.0000 mg | ORAL_TABLET | Freq: Four times a day (QID) | ORAL | Status: DC | PRN

## 2012-09-05 MED ORDER — ACETAMINOPHEN 325 MG PO TABS
650.0000 mg | ORAL_TABLET | Freq: Four times a day (QID) | ORAL | Status: DC | PRN
  Administered 2012-09-06 (×3): 650 mg via ORAL
  Filled 2012-09-05 (×3): qty 2

## 2012-09-05 MED ORDER — SODIUM CHLORIDE 0.9 % IV SOLN: 250.0000 mL | INTRAVENOUS | Status: DC | PRN

## 2012-09-05 MED ORDER — ACETAMINOPHEN 650 MG RE SUPP: 650.0000 mg | Freq: Four times a day (QID) | RECTAL | Status: DC | PRN

## 2012-09-05 MED ORDER — ONDANSETRON HCL 4 MG/2ML IJ SOLN
4.0000 mg | Freq: Once | INTRAMUSCULAR | Status: AC
  Administered 2012-09-05: 4 mg via INTRAVENOUS
  Filled 2012-09-05: qty 2

## 2012-09-05 NOTE — ED Notes (Signed)
Presents with dizziness, blurred vision, headache, confusion, nausea and vomiting that began at 9 am today. Pt his post brain surgery from aneurysm on March 15 per caregiver. He is alert, oriented, answering all questions approriately, denies weakness, PERRL, no droop or drift. BP 176/118.

## 2012-09-05 NOTE — ED Notes (Signed)
Admitting at bedside 

## 2012-09-05 NOTE — H&P (Signed)
Date: 09/05/2012               Patient Name:  Luis Oconnor MRN: [id number]  DOB: [birth year] Age / Sex: 57 y.o., male   PCP: Provider Default, MD              Medical Service: Internal Medicine Teaching Service              Attending Physician: Dr. Lars Mage, MD    First Contact: Dr. Christen Bame Pager: [phone]  Second Contact: Dr. Lorretta Harp Pager: [phone]            After Hours (After 5p/  First Contact Pager: [phone]  weekends / holidays): Second Contact Pager: [phone]   Chief Complaint: Headache, hypertension, vomiting  History of Present Illness: Mr. Luis Oconnor is a 57 year old man with a PMH of malignant hypertension and recent admission in April for a temporal intraparenchymal hemorrhage who presents to the [facility] ED with complaints of headache, hypertension, and vomiting.  His girlfriend is present at the time of interview.  He states that he has had a headache that started today that he thinks is due to his hypertension.  His girlfriend states that he has been lethargic today, requiring her to help feed him, which she hasn't had to do since he came home from Rehab at the beginning of May.  She states that he was complaining of spots in his vision and headache most of the day.  His family had suggested taking a spoonful of vinegar to get his blood pressure to come down.  He tried it and then started vomiting profusely.  His girlfriend states that it was at least 6-7 times.  It started out clear with food contents and then after a few times of vomiting turned darker.  She states that he has been taking his medications as prescribed for his blood pressure and they last saw his primary care doctor on Wednesday.  They have been adjusting his medications to bring her hypertension under control.    On arrival to the ED, a basic metabolic panel indicated marked hyponatremia with a sodium of 121. The patient states that he has been taking his medications and drinking "alot of water."  His  girlfriend states that over the last day he has drank between 8-10 large glasses of water because of the vomiting.    Meds: Current Outpatient Prescriptions  Medication Sig Dispense Refill  . acetaminophen (TYLENOL) 325 MG tablet Take 650 mg by mouth every 6 (six) hours as needed for pain.      Marland Kitchen amLODipine (NORVASC) 10 MG tablet Take 1 tablet (10 mg total) by mouth daily.  30 tablet  1  . atenolol (TENORMIN) 50 MG tablet Take 50 mg by mouth daily.      . carbamazepine (TEGRETOL XR) 200 MG 12 hr tablet Take 1 tablet (200 mg total) by mouth 2 (two) times daily.  60 tablet  1  . hydrochlorothiazide (HYDRODIURIL) 25 MG tablet Take 1 tablet (25 mg total) by mouth daily.  30 tablet  3  . lisinopril (PRINIVIL,ZESTRIL) 40 MG tablet Take 40 mg by mouth daily.      . Multiple Vitamins-Minerals (MULTIVITAMIN) tablet Take 1 tablet by mouth daily.      . methylphenidate (RITALIN) 10 MG tablet Take 1 tablet (10 mg total) by mouth 2 (two) times daily with breakfast and lunch.  60 tablet  0   Allergies: Allergies as of 09/05/2012 - Review Complete  09/05/2012  Allergen Reaction Noted  . Clonidine derivatives Nausea Only 10/07/2011   Past Medical History  Diagnosis Date  . Hypertension   . Ulcer   . Brain aneurysm    Past Surgical History  Procedure Laterality Date  . Craniotomy Right 06/14/2012    Procedure: CRANIOTOMY HEMATOMA EVACUATION SUBDURAL;  Surgeon: [name], MD;  Location: [facility];  Service: Neurosurgery;  Laterality: Right;   Family History  Problem Relation Age of Onset  . Hypertension Mother   . Hypertension Father   . Cancer Brother     leukemia  . Renal Disease Brother    History   Social History  . Marital Status: Single    Spouse Name: N/A    Number of Children: N/A  . Years of Education: N/A   Occupational History  . Not on file.   Social History Main Topics  . Smoking status: Never Smoker   . Smokeless tobacco: Not on file  . Alcohol Use: No  . Drug  Use: No  . Sexually Active: Not on file   Other Topics Concern  . Not on file   Social History Narrative  . No narrative on file   Review of Systems: Constitutional: Denies fever, chills, diaphoresis, appetite change and fatigue.  HEENT: Positive for photophobia. Denies eye pain, redness, hearing loss, ear pain, congestion, sore throat, rhinorrhea, sneezing, mouth sores, trouble swallowing, neck pain, neck stiffness and tinnitus.   Respiratory: Denies SOB, DOE, cough, chest tightness,  and wheezing.   Cardiovascular: Denies chest pain, palpitations and leg swelling.  Gastrointestinal: Positive for vomiting. Denies nausea, abdominal pain, diarrhea, constipation, blood in stool and abdominal distention.  Genitourinary: Denies dysuria, urgency, frequency, hematuria, flank pain and difficulty urinating.  Endocrine: Denies: hot or cold intolerance, sweats, changes in hair or nails, polyuria, polydipsia. Musculoskeletal: Denies myalgias, back pain, joint swelling, arthralgias and gait problem.  Skin: Denies pallor, rash and wound.  Neurological: Denies dizziness, seizures, syncope, weakness, light-headedness, numbness and headaches.  Hematological: Denies adenopathy. Easy bruising, personal or family bleeding history  Psychiatric/Behavioral: Denies suicidal ideation, mood changes, confusion, nervousness, sleep disturbance and agitation  Physical Exam: Blood pressure 174/114, pulse 66, temperature 100.3 F (37.9 C), temperature source Oral, resp. rate 13, SpO2 100.00%. Constitutional: Vital signs reviewed.  Patient is a well-developed and well-nourished man in no acute distress and cooperative with exam. Alert and oriented x3.  Head: Normocephalic and atraumatic, healing scars noted from his craniectomy  Ear: TM normal bilaterally Nose: No erythema or drainage noted.  Turbinates normal Mouth: no erythema or exudates, dry mucus membranes Eyes: PERRL, EOMI, conjunctivae normal, No scleral  icterus.  Neck: Supple, Trachea midline normal ROM, No JVD, mass, thyromegaly, or carotid bruit present.  Cardiovascular: RRR, S1 normal, S2 normal, no MRG, pulses symmetric and intact bilaterally Pulmonary/Chest: normal respiratory effort, CTAB, no wheezes, rales, or rhonchi Abdominal: Soft. Non-tender, non-distended, bowel sounds are normal, no masses, organomegaly, or guarding present.  GU: no CVA tenderness Musculoskeletal: No joint deformities, erythema, or stiffness, ROM full and no nontender Hematology: no cervical, inginal, or axillary adenopathy.  Neurological: A&O x3, Strength is normal and symmetric bilaterally, cranial nerve II-XII are grossly intact, no focal motor deficit, sensory intact to light touch bilaterally.  Skin: Warm, dry and intact. Mildly decreased skin turgor. No rash, cyanosis, or clubbing.  Psychiatric: Normal mood and affect. speech and behavior is normal. Judgment and thought content normal. Cognition and memory are normal.   Lab results: Basic Metabolic Panel:  Recent Labs  09/05/12 1942  NA 121*  K 3.6  CL 85*  CO2 27  GLUCOSE 128*  BUN 12  CREATININE 0.90  CALCIUM 9.9   Liver Function Tests:  Recent Labs  09/05/12 1942  AST 17  ALT 16  ALKPHOS 99  BILITOT 0.5  PROT 7.3  ALBUMIN 4.0   CBC:  Recent Labs  09/05/12 1942  WBC 10.6*  NEUTROABS 9.1*  HGB 15.4  HCT 39.4  MCV 76.5*  PLT 270   Cardiac Enzymes:  Recent Labs  09/05/12 1942  TROPONINI <0.30   CBG:  Recent Labs  09/05/12 2142  GLUCAP 131*   Coagulation:  Recent Labs  09/05/12 1942  LABPROT 12.4  INR 0.93   Imaging results:  Ct Head (brain) Wo Contrast  09/05/2012   *RADIOLOGY REPORT*  Clinical Data: Right-sided headache, dizziness and hypertension.  CT HEAD WITHOUT CONTRAST  Technique:  Contiguous axial images were obtained from the base of the skull through the vertex without contrast.  Comparison: 07/15/2012 and prior head CTs dating back to 06/14/2012   Findings: Persistent low density within the right posterior parietal and temporal regions is compatible with evolutionary changes of intraparenchymal hematoma. Overlying craniotomy changes are noted.  Mild chronic small vessel white matter ischemic changes are again noted.  No acute intracranial abnormalities are identified, including mass lesion or mass effect, hydrocephalus, extra-axial fluid collection, midline shift, hemorrhage, or acute infarction.  No significant changes are identified  IMPRESSION: No evidence of acute intracranial abnormality.  Expected evolutionary changes within the right parietal and temporal regions from prior intraparenchymal hematoma .   Original Report Authenticated By: Harmon Pier, M.D.   Other results: EKG: normal EKG, normal sinus rhythm, unchanged from previous tracings.  Assessment & Plan by Problem: Mr. Sleight is a 57 year old man with a history of hypertension who presents with headache, vomiting, and hyponatremia.    1.  Hyponatremia: Sodium on arrival to the ED is 121.  He has a history of a recent cranial bleed and has a history of hyponatremia that responded to fluid restriction and stopping his HCTZ during his stay in inpatient rehab.  Differential diagnosis includes hypothyroidism, SIADH, primary polydipsia, HCTZ effect, or GI losses from profuse vomiting.  He currently on exam appears mildly hypovolemic to euvolemic.    - Admit to SDU  - Urine sodium, creatinine, and osmolarity  - Serum osmolarity  - NPO for now due to vomiting.  - Follow Bmet q6 for now  - TSH, AM cortisol  - Hold HCTZ   2.  Hypertensive Urgency: Blood pressure on arrival was 170-190 SBP and the patient was complaining of spots in his vision as well as headache that changes with position.  He has a history of difficult to control hypertension.  He is unsure if he was able to keep his medications down since he has been vomiting since about lunch time this morning.  Of note, he was  prescribed Ritalin from the rehabilitation doctors for attention problems.   - Continue amlodipine, Lisinopril, and Atenolol for now  - Consider the addition of Imdur and Hydralazine  - consider work up for secondary causes of hypertension including hyperaldosteronism, renal artery stenosis etc.   3.  Vomiting: Patient states that he started vomiting after taking vinegar for his blood pressure.  He vomited multiple times.  We will hold him NPO for now and treat with Zofran PRN for his vomiting.  If he continues to vomit we will  need to gastroocult with the possibility of blood in his vomitus.  We will start Protonix daily.   4. VTE: SCDs  Dispo: Disposition is deferred at this time, awaiting improvement of current medical problems. Anticipated discharge in approximately 2-3 day(s).   The patient does have a current PCP (Provider Default, MD), therefore will not be requiring OPC follow-up after discharge.   The patient does not have transportation limitations that hinder transportation to clinic appointments.  Signed: Leodis Sias, MD 09/05/2012, 11:10 PM

## 2012-09-06 ENCOUNTER — Encounter (HOSPITAL_COMMUNITY): Payer: Self-pay | Admitting: *Deleted

## 2012-09-06 DIAGNOSIS — R51 Headache: Secondary | ICD-10-CM

## 2012-09-06 DIAGNOSIS — R1110 Vomiting, unspecified: Secondary | ICD-10-CM

## 2012-09-06 DIAGNOSIS — E871 Hypo-osmolality and hyponatremia: Secondary | ICD-10-CM

## 2012-09-06 DIAGNOSIS — I619 Nontraumatic intracerebral hemorrhage, unspecified: Secondary | ICD-10-CM

## 2012-09-06 DIAGNOSIS — I10 Essential (primary) hypertension: Secondary | ICD-10-CM

## 2012-09-06 LAB — CBC
HCT: 36.5 % — ABNORMAL LOW (ref 39.0–52.0)
MCH: 29.9 pg (ref 26.0–34.0)
MCHC: 39.2 g/dL — ABNORMAL HIGH (ref 30.0–36.0)
MCV: 76.4 fL — ABNORMAL LOW (ref 78.0–100.0)
RDW: 12.9 % (ref 11.5–15.5)
WBC: 8.9 10*3/uL (ref 4.0–10.5)

## 2012-09-06 LAB — BASIC METABOLIC PANEL
BUN: 11 mg/dL (ref 6–23)
BUN: 12 mg/dL (ref 6–23)
BUN: 12 mg/dL (ref 6–23)
BUN: 12 mg/dL (ref 6–23)
CO2: 25 mEq/L (ref 19–32)
CO2: 25 mEq/L (ref 19–32)
CO2: 24 mEq/L (ref 19–32)
Calcium: 8.8 mg/dL (ref 8.4–10.5)
Chloride: 87 mEq/L — ABNORMAL LOW (ref 96–112)
Chloride: 87 mEq/L — ABNORMAL LOW (ref 96–112)
Chloride: 91 mEq/L — ABNORMAL LOW (ref 96–112)
Creatinine, Ser: 0.94 mg/dL (ref 0.50–1.35)
Creatinine, Ser: 0.95 mg/dL (ref 0.50–1.35)
Creatinine, Ser: 0.9 mg/dL (ref 0.50–1.35)
GFR calc Af Amer: >90 mL/min (ref >90)
GFR calc Af Amer: >90 mL/min (ref >90)
GFR calc non Af Amer: >90 mL/min (ref >90)
Glucose, Bld: 122 mg/dL — ABNORMAL HIGH (ref 70–99)
Glucose, Bld: 108 mg/dL — ABNORMAL HIGH (ref 70–99)
Glucose, Bld: 104 mg/dL — ABNORMAL HIGH (ref 70–99)
Glucose, Bld: 84 mg/dL (ref 70–99)
Potassium: 3.7 mEq/L (ref 3.5–5.1)
Potassium: 3.1 mEq/L — ABNORMAL LOW (ref 3.5–5.1)
Potassium: 4 mEq/L (ref 3.5–5.1)
Sodium: 120 mEq/L — ABNORMAL LOW (ref 135–145)

## 2012-09-06 LAB — OSMOLALITY, URINE: Osmolality, Ur: 386 mOsm/kg — ABNORMAL LOW (ref 390–1090)

## 2012-09-06 LAB — MRSA PCR SCREENING: MRSA by PCR: NEGATIVE

## 2012-09-06 MED ORDER — SODIUM CHLORIDE 0.9 % IJ SOLN
INTRAMUSCULAR | Status: AC
  Filled 2012-09-06: qty 20

## 2012-09-06 MED ORDER — HYDRALAZINE HCL 20 MG/ML IJ SOLN: 5.0000 mg | INTRAMUSCULAR | Status: DC | PRN

## 2012-09-06 MED ORDER — POTASSIUM CHLORIDE CRYS ER 20 MEQ PO TBCR
40.0000 meq | EXTENDED_RELEASE_TABLET | ORAL | Status: AC
  Administered 2012-09-06 (×2): 40 meq via ORAL
  Filled 2012-09-06 (×2): qty 2

## 2012-09-06 MED ORDER — ZOLPIDEM TARTRATE 5 MG PO TABS
5.0000 mg | ORAL_TABLET | Freq: Every evening | ORAL | Status: DC | PRN
  Administered 2012-09-06: 5 mg via ORAL
  Filled 2012-09-06: qty 1

## 2012-09-06 NOTE — Progress Notes (Signed)
Medical Student Daily Progress Note  Luis Oconnor is a 57 yo gentleman with PMH significant for recent right temporoparietal intraparenchymal hematoma s/p craniotomy 06/14/12 on neurorehab who presented with severe headache, vomiting, and hypertensive urgency after taking vinegar as a home remedy for his elevated BP.  Subjective:  Patient is feeling much better this morning with resolution of his headache and vomiting. No acute events overnight. No other complaints.  Objective: Vital signs in last 24 hours: Filed Vitals:   09/06/12 0813 09/06/12 0814 09/06/12 0815 09/06/12 0831  BP:      Pulse: 60 60 103 60  Temp:      TempSrc:      Resp: 16 16 16 13   Height:      Weight:      SpO2: 100% 99% 99% 100%   Weight change:   Intake/Output Summary (Last 24 hours) at 09/06/12 1016 Last data filed at 09/06/12 1014  Gross per 24 hour  Intake    350 ml  Output   1250 ml  Net   -900 ml   Physical Exam: BP 173/107  Pulse 60  Temp(Src) 99 F (37.2 C) (Oral)  Resp 13  Ht 5\' 6"  (1.676 m)  Wt 69 kg (152 lb 1.9 oz)  BMI 24.56 kg/m2  SpO2 100% General appearance: alert, cooperative and no distress Head: Normocephalic, without obvious abnormality, atraumatic, well-healed scars from craniotomy Eyes: negative findings: conjunctivae and sclerae normal and pupils equal, round, reactive to light and accomodation, positive findings: visual field defect RIGHT lower quadrantanopsia Nose: Nares normal. Septum midline. Mucosa normal. No drainage or sinus tenderness. Neck: no adenopathy, no JVD and supple, symmetrical, trachea midline Back: symmetric, no curvature. ROM normal. No CVA tenderness. Lungs: clear to auscultation bilaterally Chest wall: no tenderness Heart: regular rate and rhythm, S1, S2 normal and S4 present Abdomen: normal findings: bowel sounds normal, no masses palpable, no organomegaly and soft, non-tender and abnormal findings:  distended Extremities: extremities normal,  atraumatic, no cyanosis or edema Pulses: 2+ and symmetric Skin: Skin color, texture, turgor normal. No rashes or lesions Lymph nodes: Cervical, supraclavicular, and axillary nodes normal. Neurologic: Mental status: Alert, oriented, thought content appropriate Cranial nerves: II: visual field normal, lower quadrantanopia, RIGHT, II: pupils equal, round, reactive to light and accommodation, III,IV,VI: extraocular muscles extra-ocular motions intact, V: facial light touch sensation normal bilaterally, VII: upper facial muscle function normal bilaterally, VII: lower facial muscle function normal bilaterally, VIII: hearing normal, XI: trapezius strength normal bilaterally, XI: sternocleidomastoid strength normal bilaterally Sensory: normal Motor: grossly normal Coordination: finger to nose normal bilaterally  Lab Results:  Admission on 09/05/2012  Component Date Value  . Prothrombin Time 09/05/2012 12.4   . INR 09/05/2012 0.93   . aPTT 09/05/2012 30   . WBC 09/05/2012 10.6*  . RBC 09/05/2012 5.15   . Hemoglobin 09/05/2012 15.4   . HCT 09/05/2012 39.4   . MCV 09/05/2012 76.5*  . [facility] 09/05/2012 29.9   . MCHC 09/05/2012 39.1*  . RDW 09/05/2012 13.1   . Platelets 09/05/2012 270   . Neutrophils Relative % 09/05/2012 86*  . Neutro Abs 09/05/2012 9.1*  . Lymphocytes Relative 09/05/2012 8*  . Lymphs Abs 09/05/2012 0.9   . Monocytes Relative 09/05/2012 5   . Monocytes Absolute 09/05/2012 0.5   . Eosinophils Relative 09/05/2012 0   . Eosinophils Absolute 09/05/2012 0.0   . Basophils Relative 09/05/2012 0   . Basophils Absolute 09/05/2012 0.0   . Sodium 09/05/2012 121*  . Potassium 09/05/2012  3.6   . Chloride 09/05/2012 85*  . CO2 09/05/2012 27   . Glucose, Bld 09/05/2012 128*  . BUN 09/05/2012 12   . Creatinine, Ser 09/05/2012 0.90   . Calcium 09/05/2012 9.9   . Total Protein 09/05/2012 7.3   . Albumin 09/05/2012 4.0   . AST 09/05/2012 17   . ALT 09/05/2012 16   . Alkaline  Phosphatase 09/05/2012 99   . Total Bilirubin 09/05/2012 0.5   . GFR calc non Af Amer 09/05/2012 >90   . GFR calc Af Amer 09/05/2012 >90   . Troponin I 09/05/2012 <0.30   . Troponin i, poc 09/05/2012 0.01   . Comment 3 09/05/2012          . Glucose-Capillary 09/05/2012 131*  . WBC 09/06/2012 8.9   . RBC 09/06/2012 4.78   . Hemoglobin 09/06/2012 14.3   . HCT 09/06/2012 36.5*  . MCV 09/06/2012 76.4*  . [facility] 09/06/2012 29.9   . MCHC 09/06/2012 39.2*  . RDW 09/06/2012 12.9   . Platelets 09/06/2012 235   . Creatinine, Urine 09/06/2012 37.73   . Sodium, Ur 09/06/2012 111   . Osmolality, Ur 09/06/2012 386*  . Osmolality 09/06/2012 255*  . TSH 09/06/2012 0.299*  . Sodium 09/06/2012 120*  . Potassium 09/06/2012 3.7   . Chloride 09/06/2012 86*  . CO2 09/06/2012 25   . Glucose, Bld 09/06/2012 122*  . BUN 09/06/2012 11   . Creatinine, Ser 09/06/2012 0.83   . Calcium 09/06/2012 9.4   . GFR calc non Af Amer 09/06/2012 >90   . GFR calc Af Amer 09/06/2012 >90   . Sodium 09/06/2012 123*  . Potassium 09/06/2012 3.1*  . Chloride 09/06/2012 87*  . CO2 09/06/2012 25   . Glucose, Bld 09/06/2012 108*  . BUN 09/06/2012 12   . Creatinine, Ser 09/06/2012 0.94   . Calcium 09/06/2012 8.8   . GFR calc non Af Amer 09/06/2012 90*  . GFR calc Af Amer 09/06/2012 >90   . Sodium 09/06/2012 123*  . Potassium 09/06/2012 3.2*  . Chloride 09/06/2012 87*  . CO2 09/06/2012 25   . Glucose, Bld 09/06/2012 104*  . BUN 09/06/2012 12   . Creatinine, Ser 09/06/2012 0.95   . Calcium 09/06/2012 8.8   . GFR calc non Af Amer 09/06/2012 89*  . GFR calc Af Amer 09/06/2012 >90   . MRSA by PCR 09/06/2012 NEGATIVE     Micro Results: Recent Results (from the past 240 hour(s))  MRSA PCR SCREENING     Status: None   Collection Time    09/06/12 12:12 AM      Result Value Range Status   MRSA by PCR NEGATIVE  NEGATIVE Final   Comment:            The GeneXpert MRSA Assay (FDA     approved for NASAL specimens      only), is one component of a     comprehensive MRSA colonization     surveillance program. It is not     intended to diagnose MRSA     infection nor to guide or     monitor treatment for     MRSA infections.   Studies/Results: Ct Head (brain) Wo Contrast  09/05/2012   *RADIOLOGY REPORT*  Clinical Data: Right-sided headache, dizziness and hypertension.  CT HEAD WITHOUT CONTRAST  Technique:  Contiguous axial images were obtained from the base of the skull through the vertex without contrast.  Comparison: 07/15/2012 and prior head CTs  dating back to 06/14/2012  Findings: Persistent low density within the right posterior parietal and temporal regions is compatible with evolutionary changes of intraparenchymal hematoma. Overlying craniotomy changes are noted.  Mild chronic small vessel white matter ischemic changes are again noted.  No acute intracranial abnormalities are identified, including mass lesion or mass effect, hydrocephalus, extra-axial fluid collection, midline shift, hemorrhage, or acute infarction.  No significant changes are identified  IMPRESSION: No evidence of acute intracranial abnormality.  Expected evolutionary changes within the right parietal and temporal regions from prior intraparenchymal hematoma .   Original Report Authenticated By: Harmon Pier, M.D.   Medications: I have reviewed the patient's current medications. Scheduled Meds: . amLODipine  10 mg Oral Daily  . atenolol  50 mg Oral Daily  . carbamazepine  200 mg Oral BID  . lisinopril  40 mg Oral Daily  . pantoprazole (PROTONIX) IV  40 mg Intravenous Daily  . potassium chloride  40 mEq Oral Q4H  . sodium chloride  3 mL Intravenous Q12H  . sodium chloride  3 mL Intravenous Q12H   Continuous Infusions:  PRN Meds:.sodium chloride, acetaminophen, acetaminophen, ondansetron (ZOFRAN) IV, ondansetron, sodium chloride, zolpidem  Assessment/Plan: Active Problems:   Malignant hypertension   Hyponatremia   Headache(784.0)    Vomiting   LOS: 1 day   Luis Oconnor is a 58 yo gentleman with PMH significant for recent right temporoparietal intraparenchymal hematoma s/p craniotomy 06/14/12 in neurorehab who presented with severe headache, vomiting, and hypertensive urgency.  Hyponatremia: On presentation, patient's Na was 121, euvolemic. 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 Differential diagnosis includes SIADH, medications (HCTZ, Ritalin), polydipsia (admits drinking a lot of water daily), vomiting, adrenal insufficiency. 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 Na slowly correcting with fluid restriction and holding HCTZ, Ritalin. -- Continue holding HCTZ, Ritalin -- NPO/ice chips --> advance diet today given resolution of symptoms -- BMET q6h -- Fluid restrict   Hypertensive Urgency: SBP up to 195 in ED, received Lopressor and downtrending to 170s/110s. Holding HCTZ 2/2 hyponatremia. Unsure of baseline BP. Head CT showed appropriate changes from previous hematoma, no acute intracranial processes. -- Hold HCTZ per above -- Atenolol 50 -- Lisinopril 40 -- Amlodipine 10 -- Vitals q4h  Vomiting: Began vomiting after ingestion of teaspoon of undiluted vinegar at home (as home remedy to decrease BP). Vomited 6-7 times, included some coffee ground emesis.  -- Protonix IV 40mg  d/c --> switch to PO -- Zofran PRN nausea/vomiting  Headache: On presentation c/o severe headache. Head CT - no acute intracranial processes. HA resolved. -- Tylenol PRN headache. -- BP checks  Intraparenchymal Hematoma: s/p craniotomy 06/14/12. Is on neurorehab, missed May sessions. Residual deficit: right lower quadrantanopia. No other deficits. Head CT on presentation showed no acute intracranial processes. -- Continue Tegretol for seizure prophylaxis  Prophylaxis:  -- SCDs   This is a Psychologist, occupational Note.  The care of the patient was discussed with Dr. Meredith Pel and the  assessment and plan formulated with their assistance.  Please see their attached note for official documentation of the daily encounter.  Claudine Mouton 09/06/2012, 10:16 AM

## 2012-09-06 NOTE — Progress Notes (Signed)
   I have seen the patient and reviewed the daily progress note by Claudine Mouton, MS III and discussed the care of the patient with them.  See my progress note for documentation of my findings, assessment, and plans.  Christen Bame, MD 09/06/2012, 12:11 PM

## 2012-09-06 NOTE — H&P (Signed)
Internal Medicine On-Call Attending Admission Note Date: 09/06/2012  Patient name: Luis Oconnor Medical record number: [id number] Date of birth: 1955/05/18 Age: 57 y.o. Gender: male  I saw and evaluated the patient. I reviewed the resident's note and I agree with the resident's findings and plan as documented in the resident's note, with the following additional comments.  Chief Complaint(s): Headache, nausea and vomiting  History - key components related to admission: Patient is a 57 year old man with history of hypertension, right temporoparietal intraparenchymal hematoma in March of 2014, status post right temporal craniotomy by Dr. Gerlene Fee, and other problems as outlined in the medical history admitted with headache, nausea, and vomiting.  He reports that his vomiting began after he took vinegar as a home remedy, and he feels that the headache was caused by the vomiting.  He denies any new neurologic deficits; he reports a left visual field deficit that has been present since his intracranial hemorrhage.  He reports that he has been compliant with his medications.  He also reports that he drinks several glasses of water per day.  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), cranial nerves otherwise intact; motor 5 over 5 throughout; cerebellar intact by finger-nose   Lab results:   Basic Metabolic Panel:  Recent Labs   09/06/12 0520 09/06/12 0725  NA 123* 123*  K 3.1* 3.2*  CL 87* 87*  CO2 25 25  GLUCOSE 108* 104*  BUN 12 12  CREATININE 0.94 0.95  CALCIUM 8.8 8.8   Liver Function Tests:  Recent Labs  09/05/12 1942  AST 17  ALT 16  ALKPHOS 99  BILITOT 0.5  PROT 7.3  ALBUMIN 4.0    CBC:  Recent Labs  09/05/12 1942 09/06/12 0520  WBC 10.6* 8.9  NEUTROABS 9.1*  --   HGB 15.4 14.3  HCT 39.4 36.5*  MCV 76.5* 76.4*  PLT 270 235   Cardiac Enzymes:  Recent Labs  09/05/12 1942  TROPONINI <0.30    CBG:  Recent Labs  09/05/12 2142  GLUCAP 131*    Thyroid Function Tests:  Recent Labs  09/06/12 0015  TSH 0.299*    Coagulation:  Recent Labs  09/05/12 1942  INR 0.93     Imaging results:  Ct Head (brain) Wo Contrast  09/05/2012   *RADIOLOGY REPORT*  Clinical Data: Right-sided headache, dizziness and hypertension.  CT HEAD WITHOUT CONTRAST  Technique:  Contiguous axial images were obtained from the base of the skull through the vertex without contrast.  Comparison: 07/15/2012 and prior head CTs dating back to 06/14/2012  Findings: Persistent low density within the right posterior parietal and temporal regions is compatible with evolutionary changes of intraparenchymal hematoma. Overlying craniotomy changes are noted.  Mild chronic small vessel white matter ischemic changes are again noted.  No acute intracranial abnormalities are identified, including mass  lesion or mass effect, hydrocephalus, extra-axial fluid collection, midline shift, hemorrhage, or acute infarction.  No significant changes are identified  IMPRESSION: No evidence of acute intracranial abnormality.  Expected evolutionary changes within the right parietal and temporal regions from prior intraparenchymal hematoma .   Original Report Authenticated By: Harmon Pier, M.D.    Other results: EKG: normal sinus rhythm; ST and T wave abnormality V5-V6   Assessment & Plan by Problem:  1.  Hyponatremia.  The  chronicity of this is not clear; patient does have a history of hyponatremia managed during his inpatient rehabilitation that responded to fluid restriction and stopping hydrochlorothiazide.  The urine osmolality of 386 supports SIADH, although there may be a contribution from his water consumption.  Possible contributing factors include the CNS event in March, as well as hydrochlorothiazide.  Plans include stop hydrochlorothiazide; check orthostatics and if volume depleted then replace volume with IV normal saline; if euvolemic then fluid restriction and follow sodium.  2.  Hypertension.  Patient presented with elevated blood pressure, which may been secondary to headache and other symptoms or may possibly have been causal.  His blood pressure has improved with institution of his oral regimen.  His headache has resolved completely.  Plan is follow blood pressure closely and adjust regimen as indicated.  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  We have discontinued Ritalin, which will likely aggravate his hypertension.  3.  Nausea and vomiting.  Symptoms have resolved, and patient reports feeling much better.  The etiology of the nausea and vomiting is unclear.  There is no evidence of acute CNS event on head CT scan.  4.  Other problems and plans as per the resident physician's note.  5.  Dr. Eben Burow will return as attending physician tomorrow 09/07/2012.

## 2012-09-06 NOTE — Progress Notes (Signed)
Subjective: Pt doing well this AM. Total resolution of HA. BP at goal with outpt meds.   Objective: Vital signs in last 24 hours: Filed Vitals:   09/06/12 0026 09/06/12 0320 09/06/12 0632 09/06/12 0700  BP:  139/80    Pulse: 66 74 60 59  Temp:  99 F (37.2 C)    TempSrc:  Oral    Resp: 17 19 18 19   Height:      Weight:      SpO2: 99% 95% 100% 98%   Weight change:   Intake/Output Summary (Last 24 hours) at 09/06/12 5784 Last data filed at 09/06/12 0600  Gross per 24 hour  Intake    150 ml  Output    900 ml  Net   -750 ml   General appearance: alert, cooperative and no distress  Head: Normocephalic, without obvious abnormality, atraumatic, well-healed scars from craniotomy  Eyes: negative findings: conjunctivae and sclerae normal and pupils equal, round, reactive to light and accomodation, positive findings: visual field defect RIGHT lower quadrantanopsia  Nose: Nares normal. Septum midline. Mucosa normal. No drainage or sinus tenderness.  Neck: no adenopathy, no JVD and supple, symmetrical, trachea midline  Back: symmetric, no curvature. ROM normal. No CVA tenderness.  Lungs: clear to auscultation bilaterally  Chest wall: no tenderness  Heart: regular rate and rhythm, S1, S2 normal and S4 present  Abdomen: normal findings: bowel sounds normal, no masses palpable, no organomegaly and soft, non-tender and abnormal findings: distended  Extremities: extremities normal, atraumatic, no cyanosis or edema  Pulses: 2+ and symmetric  Skin: Skin color, texture, turgor normal. No rashes or lesions  Lymph nodes: Cervical, supraclavicular, and axillary nodes normal.  Neurologic: Mental status: Alert, oriented, thought content appropriate  Cranial nerves: II: visual field normal, lower quadrantanopia, RIGHT, II: pupils equal, round, reactive to light and accommodation, III,IV,VI: extraocular muscles extra-ocular motions intact, V: facial light touch sensation normal bilaterally, VII: upper  facial muscle function normal bilaterally, VII: lower facial muscle function normal bilaterally, VIII: hearing normal, XI: trapezius strength normal bilaterally, XI: sternocleidomastoid strength normal bilaterally  Sensory: normal  Motor: grossly normal  Coordination: finger to nose normal bilaterally  Lab Results: Basic Metabolic Panel:  Recent Labs Lab 09/06/12 0015 09/06/12 0520  NA 120* 123*  K 3.7 3.1*  CL 86* 87*  CO2 25 25  GLUCOSE 122* 108*  BUN 11 12  CREATININE 0.83 0.94  CALCIUM 9.4 8.8   Liver Function Tests:  Recent Labs Lab 09/05/12 1942  AST 17  ALT 16  ALKPHOS 99  BILITOT 0.5  PROT 7.3  ALBUMIN 4.0   No results found for this basename: LIPASE, AMYLASE,  in the last 168 hours No results found for this basename: AMMONIA,  in the last 168 hours CBC:  Recent Labs Lab 09/05/12 1942 09/06/12 0520  WBC 10.6* 8.9  NEUTROABS 9.1*  --   HGB 15.4 14.3  HCT 39.4 36.5*  MCV 76.5* 76.4*  PLT 270 235   Cardiac Enzymes:  Recent Labs Lab 09/05/12 1942  TROPONINI <0.30   BNP: No results found for this basename: PROBNP,  in the last 168 hours D-Dimer: No results found for this basename: DDIMER,  in the last 168 hours CBG:  Recent Labs Lab 09/05/12 2142  GLUCAP 131*   Hemoglobin A1C: No results found for this basename: HGBA1C,  in the last 168 hours Fasting Lipid Panel: No results found for this basename: CHOL, HDL, LDLCALC, TRIG, CHOLHDL, LDLDIRECT,  in the last 168  hours Thyroid Function Tests:  Recent Labs Lab 09/06/12 0015  TSH 0.299*   Coagulation:  Recent Labs Lab 09/05/12 1942  LABPROT 12.4  INR 0.93   Anemia Panel: No results found for this basename: VITAMINB12, FOLATE, FERRITIN, TIBC, IRON, RETICCTPCT,  in the last 168 hours Urine Drug Screen: Drugs of Abuse  No results found for this basename: labopia, cocainscrnur, labbenz, amphetmu, thcu, labbarb    Alcohol Level: No results found for this basename: ETH,  in the last  168 hours Urinalysis: No results found for this basename: COLORURINE, APPERANCEUR, LABSPEC, PHURINE, GLUCOSEU, HGBUR, BILIRUBINUR, KETONESUR, PROTEINUR, UROBILINOGEN, NITRITE, LEUKOCYTESUR,  in the last 168 hours  Micro Results: Recent Results (from the past 240 hour(s))  MRSA PCR SCREENING     Status: None   Collection Time    09/06/12 12:12 AM      Result Value Range Status   MRSA by PCR NEGATIVE  NEGATIVE Final   Comment:            The GeneXpert MRSA Assay (FDA     approved for NASAL specimens     only), is one component of a     comprehensive MRSA colonization     surveillance program. It is not     intended to diagnose MRSA     infection nor to guide or     monitor treatment for     MRSA infections.   Studies/Results: Ct Head (brain) Wo Contrast  09/05/2012   *RADIOLOGY REPORT*  Clinical Data: Right-sided headache, dizziness and hypertension.  CT HEAD WITHOUT CONTRAST  Technique:  Contiguous axial images were obtained from the base of the skull through the vertex without contrast.  Comparison: 07/15/2012 and prior head CTs dating back to 06/14/2012  Findings: Persistent low density within the right posterior parietal and temporal regions is compatible with evolutionary changes of intraparenchymal hematoma. Overlying craniotomy changes are noted.  Mild chronic small vessel white matter ischemic changes are again noted.  No acute intracranial abnormalities are identified, including mass lesion or mass effect, hydrocephalus, extra-axial fluid collection, midline shift, hemorrhage, or acute infarction.  No significant changes are identified  IMPRESSION: No evidence of acute intracranial abnormality.  Expected evolutionary changes within the right parietal and temporal regions from prior intraparenchymal hematoma .   Original Report Authenticated By: Harmon Pier, M.D.   Medications: I have reviewed the patient's current medications. Scheduled Meds: . amLODipine  10 mg Oral Daily  .  atenolol  50 mg Oral Daily  . carbamazepine  200 mg Oral BID  . lisinopril  40 mg Oral Daily  . pantoprazole (PROTONIX) IV  40 mg Intravenous Daily  . potassium chloride  40 mEq Oral Q4H  . sodium chloride  3 mL Intravenous Q12H  . sodium chloride  3 mL Intravenous Q12H   Continuous Infusions:  PRN Meds:.sodium chloride, acetaminophen, acetaminophen, ondansetron (ZOFRAN) IV, ondansetron, sodium chloride, zolpidem Assessment/Plan: # Hyponatremia: this is likely multifactorial in setting of recent polydipsia, cerebral salt wasting syndrome, HCTZ, and ritalin. Na slowly correcting with fluid restriction and d/c of HCTZ, Ritalin.  -ADAT  -- Fluid restrict <1L -Bmet q8hr  # Hypertensive Urgency: SBP up to 195 in ED, received Lopressor and downtrending to 170s/110s. Holding HCTZ 2/2 hyponatremia. Unsure of baseline BP. Head CT no acute intracranial processes with cerebromalacia changes consistent with healing ICH.  - cont home bp of atenolol, lisinopril, amlodpine -prn hydralazine SBP <190   #Vomiting-resolved: Began vomiting after ingestion of teaspoon of undiluted vinegar  at home (as home remedy to decrease BP). Vomited 6-7 times, included some coffee ground emesis.  -po protonix 40mg  BID  - Zofran PRN nausea/vomiting   #Headache likely multifactorial- resolved: On presentation c/o severe headache. Head CT - no acute intracranial processes. HA resolved.  -Tylenol PRN headache.  -neuro checks q shift   # Intraparenchymal Hematoma: s/p craniotomy 06/14/12. Is on neurorehab, missed May sessions. Residual deficit: right lower quadrantanopia. No other deficits. Head CT on presentation showed no acute intracranial processes.  - Continue Tegretol for seizure prophylaxis -d/c ritalin and given significant SE profile including lowering seizure threshold/HTN/and stroke in setting of recent ICH would NOT restart at d/c   Dispo: Disposition is deferred at this time, awaiting improvement of current  medical problems.  Anticipated discharge in approximately 1-2 day(s).   The patient does not have a current PCP (Default, Provider, MD), therefore will not be requiring OPC follow-up after discharge.   The patient does not have transportation limitations that hinder transportation to clinic appointments.  .Services Needed at time of discharge: Y = Yes, Blank = No PT:   OT:   RN:   Equipment:   Other:     LOS: 1 day   Christen Bame 09/06/2012, 8:21 AM [phone]

## 2012-09-07 LAB — BASIC METABOLIC PANEL
BUN: 13 mg/dL (ref 6–23)
BUN: 13 mg/dL (ref 6–23)
BUN: 18 mg/dL (ref 6–23)
CO2: 22 mEq/L (ref 19–32)
CO2: 23 mEq/L (ref 19–32)
Calcium: 9 mg/dL (ref 8.4–10.5)
Calcium: 9.3 mg/dL (ref 8.4–10.5)
Chloride: 93 mEq/L — ABNORMAL LOW (ref 96–112)
Chloride: 94 mEq/L — ABNORMAL LOW (ref 96–112)
Creatinine, Ser: 0.87 mg/dL (ref 0.50–1.35)
Creatinine, Ser: 1.14 mg/dL (ref 0.50–1.35)
GFR calc non Af Amer: 89 mL/min — ABNORMAL LOW (ref >90)
GFR calc non Af Amer: 69 mL/min — ABNORMAL LOW (ref >90)
Glucose, Bld: 117 mg/dL — ABNORMAL HIGH (ref 70–99)
Glucose, Bld: 89 mg/dL (ref 70–99)
Glucose, Bld: 92 mg/dL (ref 70–99)
Potassium: 4.6 mEq/L (ref 3.5–5.1)
Sodium: 125 mEq/L — ABNORMAL LOW (ref 135–145)

## 2012-09-07 MED ORDER — PANTOPRAZOLE SODIUM 40 MG PO TBEC
40.0000 mg | DELAYED_RELEASE_TABLET | Freq: Every day | ORAL | Status: DC
  Administered 2012-09-07 – 2012-09-08 (×2): 40 mg via ORAL
  Filled 2012-09-07 (×2): qty 1

## 2012-09-07 NOTE — Evaluation (Signed)
Physical Therapy Evaluation Patient Details Name: Luis Oconnor MRN: [id number] DOB: 1955-06-26 Today's Date: 09/07/2012 Time: 4098-1191 PT Time Calculation (min): 20 min  PT Assessment / Plan / Recommendation Clinical Impression  Patient is a 57 year old man with history of hypertension, right temporoparietal intraparenchymal hematoma in March of 2014, status post right temporal craniotomy by Dr. Gerlene Fee, and other problems as outlined in the medical history admitted with headache, nausea, and vomiting.  Pt presents with generalized weakness, impaired balance and gait and will benefit from skilled PT services to address deficits and increase functional independence.    PT Assessment  Patient needs continued PT services    Follow Up Recommendations  Home health PT    Does the patient have the potential to tolerate intense rehabilitation      Barriers to Discharge None      Equipment Recommendations  None recommended by PT    Recommendations for Other Services     Frequency Min 4X/week    Precautions / Restrictions Restrictions Weight Bearing Restrictions: No   Pertinent Vitals/Pain No c/o pain      Mobility  Transfers Transfers: Sit to Stand;Stand to Sit Sit to Stand: 5: Supervision Stand to Sit: 5: Supervision Ambulation/Gait Ambulation/Gait Assistance: 4: Min assist Ambulation Distance (Feet): 100 Feet Assistive device: Rolling walker Ambulation/Gait Assistance Details: Pt leans heavily on RW, shuffles feet as he fatigues, needs cues for foot clearance and posture Gait velocity: slow, at risk for falls    Exercises     PT Diagnosis: Difficulty walking;Generalized weakness;Abnormality of gait  PT Problem List: Decreased strength;Decreased activity tolerance;Decreased balance;Decreased mobility;Decreased coordination PT Treatment Interventions: DME instruction;Gait training;Stair training;Functional mobility training;Therapeutic activities;Therapeutic  exercise;Balance training;Neuromuscular re-education;Modalities;Wheelchair mobility training;Patient/family education   PT Goals Acute Rehab PT Goals PT Goal Formulation: With patient Time For Goal Achievement: 09/14/12 Potential to Achieve Goals: Good Pt will Transfer Bed to Chair/Chair to Bed: with modified independence Pt will Ambulate: 51 - 150 feet;with modified independence Pt will Perform Home Exercise Program: with supervision, verbal cues required/provided  Visit Information  Last PT Received On: 09/07/12 Assistance Needed: +1    Subjective Data  Subjective: I feel much better Patient Stated Goal: Get healthy   Prior Functioning  Home Living Lives With: Significant other Available Help at Discharge: Available PRN/intermittently Type of Home: Apartment Home Access: Level entry Home Layout: One level Home Adaptive Equipment: Walker - rolling Prior Function Level of Independence: Independent with assistive device(s) Driving: No Comments: 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 Communication Communication: No difficulties    Cognition  Cognition Arousal/Alertness: Awake/alert Behavior During Therapy: WFL for tasks assessed/performed Overall Cognitive Status: Within Functional Limits for tasks assessed    Extremity/Trunk Assessment Right Lower Extremity Assessment RLE ROM/Strength/Tone: Within functional levels RLE Coordination: Deficits Left Lower Extremity Assessment LLE ROM/Strength/Tone: Within functional levels Trunk Assessment Trunk Assessment: Other exceptions Trunk Exceptions: delayed balance reactions   Balance Static Standing Balance Static Standing - Balance Support: During functional activity Static Standing - Level of Assistance: 4: Min assist  End of Session PT - End of Session Equipment Utilized During Treatment: Gait belt Activity Tolerance: Patient tolerated treatment well Patient left: in bed;with call bell/phone within  reach Nurse Communication: Mobility status  GP     Tiffanni Scarfo 09/07/2012, 2:13 PM

## 2012-09-07 NOTE — Progress Notes (Addendum)
Internal Medicine Teaching Service Attending Note Date: 09/07/2012  Patient name: Luis Oconnor  Medical record number: [id number]  Date of birth: 1955/04/24    This patient has been seen and discussed with the house staff. Please see their note for complete details. 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 Na has improved a little but still 125 which is way below his basline.  Lars Mage 09/07/2012, 1:29 PM

## 2012-09-07 NOTE — Progress Notes (Signed)
Medical Student Daily Progress Note  Subjective:  Luis Oconnor is feeling well today. He did experience a headache yesterday afternoon that he described as "aggravating" and "light pounding" and 8/10 on the pain scale. He denied nausea/vomiting, photophobia, or changes in vision. He tried to rest, but this did not relieve the pain. Before bed he took 2 tylenol and his headache resolved. Patient does not have a headache this morning. Patient has been having good urine output but has not had a BM since his admission to the hospital. He does not feel constipated. Otherwise patient has no complaints.  Objective: Vital signs in last 24 hours: Filed Vitals:   09/06/12 2000 09/06/12 2331 09/07/12 0346 09/07/12 0817  BP: 154/88 147/82 135/81 145/87  Pulse: 44 45 47 48  Temp:  98 F (36.7 C) 98.4 F (36.9 C) 98.5 F (36.9 C)  TempSrc:  Oral Oral Oral  Resp: 9 19 15 16   Height:      Weight:      SpO2: 96% 98% 98% 99%   Weight change:   Intake/Output Summary (Last 24 hours) at 09/07/12 1119 Last data filed at 09/07/12 0800  Gross per 24 hour  Intake    840 ml  Output   1875 ml  Net  -1035 ml   Physical Exam: BP 145/87  Pulse 48  Temp(Src) 98.5 F (36.9 C) (Oral)  Resp 16  Ht 5\' 6"  (1.676 m)  Wt 69 kg (152 lb 1.9 oz)  BMI 24.56 kg/m2  SpO2 99% General appearance: alert, cooperative and no distress Head: Normocephalic, without obvious abnormality, atraumatic Eyes: negative findings: conjunctivae and sclerae normal and pupils equal, round, reactive to light and accomodation Ears: Normal external ears Neck: no adenopathy, no JVD and supple, symmetrical, trachea midline Back: symmetric, no curvature. ROM normal. No CVA tenderness. Lungs: clear to auscultation bilaterally Heart: regular rate and rhythm, S4 present and slow rate Abdomen: soft, non-tender; bowel sounds normal; no masses,  no organomegaly Pulses: 2+ and symmetric Neurologic: Grossly normal  Lab Results:  Admission  on 09/05/2012  Component Date Value  . Prothrombin Time 09/05/2012 12.4   . INR 09/05/2012 0.93   . aPTT 09/05/2012 30   . WBC 09/05/2012 10.6*  . RBC 09/05/2012 5.15   . Hemoglobin 09/05/2012 15.4   . HCT 09/05/2012 39.4   . MCV 09/05/2012 76.5*  . [facility] 09/05/2012 29.9   . MCHC 09/05/2012 39.1*  . RDW 09/05/2012 13.1   . Platelets 09/05/2012 270   . Neutrophils Relative % 09/05/2012 86*  . Neutro Abs 09/05/2012 9.1*  . Lymphocytes Relative 09/05/2012 8*  . Lymphs Abs 09/05/2012 0.9   . Monocytes Relative 09/05/2012 5   . Monocytes Absolute 09/05/2012 0.5   . Eosinophils Relative 09/05/2012 0   . Eosinophils Absolute 09/05/2012 0.0   . Basophils Relative 09/05/2012 0   . Basophils Absolute 09/05/2012 0.0   . Sodium 09/05/2012 121*  . Potassium 09/05/2012 3.6   . Chloride 09/05/2012 85*  . CO2 09/05/2012 27   . Glucose, Bld 09/05/2012 128*  . BUN 09/05/2012 12   . Creatinine, Ser 09/05/2012 0.90   . Calcium 09/05/2012 9.9   . Total Protein 09/05/2012 7.3   . Albumin 09/05/2012 4.0   . AST 09/05/2012 17   . ALT 09/05/2012 16   . Alkaline Phosphatase 09/05/2012 99   . Total Bilirubin 09/05/2012 0.5   . GFR calc non Af Amer 09/05/2012 >90   . GFR calc Af Amer 09/05/2012 >90   .  Troponin I 09/05/2012 <0.30   . Troponin i, poc 09/05/2012 0.01   . Comment 3 09/05/2012          . Glucose-Capillary 09/05/2012 131*  . WBC 09/06/2012 8.9   . RBC 09/06/2012 4.78   . Hemoglobin 09/06/2012 14.3   . HCT 09/06/2012 36.5*  . MCV 09/06/2012 76.4*  . [facility] 09/06/2012 29.9   . MCHC 09/06/2012 39.2*  . RDW 09/06/2012 12.9   . Platelets 09/06/2012 235   . Creatinine, Urine 09/06/2012 37.73   . Sodium, Ur 09/06/2012 111   . Osmolality, Ur 09/06/2012 386*  . Osmolality 09/06/2012 255*  . Urea Nitrogen, Ur 09/06/2012 284   . TSH 09/06/2012 0.299*  . Sodium 09/06/2012 120*  . Potassium 09/06/2012 3.7   . Chloride 09/06/2012 86*  . CO2 09/06/2012 25   . Glucose, Bld 09/06/2012 122*   . BUN 09/06/2012 11   . Creatinine, Ser 09/06/2012 0.83   . Calcium 09/06/2012 9.4   . GFR calc non Af Amer 09/06/2012 >90   . GFR calc Af Amer 09/06/2012 >90   . Sodium 09/06/2012 123*  . Potassium 09/06/2012 3.1*  . Chloride 09/06/2012 87*  . CO2 09/06/2012 25   . Glucose, Bld 09/06/2012 108*  . BUN 09/06/2012 12   . Creatinine, Ser 09/06/2012 0.94   . Calcium 09/06/2012 8.8   . GFR calc non Af Amer 09/06/2012 90*  . GFR calc Af Amer 09/06/2012 >90   . Sodium 09/06/2012 123*  . Potassium 09/06/2012 3.2*  . Chloride 09/06/2012 87*  . CO2 09/06/2012 25   . Glucose, Bld 09/06/2012 104*  . BUN 09/06/2012 12   . Creatinine, Ser 09/06/2012 0.95   . Calcium 09/06/2012 8.8   . GFR calc non Af Amer 09/06/2012 89*  . GFR calc Af Amer 09/06/2012 >90   . MRSA by PCR 09/06/2012 NEGATIVE   . Free T4 09/06/2012 1.04   . Sodium 09/06/2012 124*  . Potassium 09/06/2012 4.0   . Chloride 09/06/2012 91*  . CO2 09/06/2012 24   . Glucose, Bld 09/06/2012 84   . BUN 09/06/2012 12   . Creatinine, Ser 09/06/2012 0.90   . Calcium 09/06/2012 9.1   . GFR calc non Af Amer 09/06/2012 >90   . GFR calc Af Amer 09/06/2012 >90   . Sodium 09/06/2012 124*  . Potassium 09/06/2012 3.8   . Chloride 09/06/2012 93*  . CO2 09/06/2012 23   . Glucose, Bld 09/06/2012 117*  . BUN 09/06/2012 13   . Creatinine, Ser 09/06/2012 0.87   . Calcium 09/06/2012 9.3   . GFR calc non Af Amer 09/06/2012 >90   . GFR calc Af Amer 09/06/2012 >90   . Sodium 09/07/2012 125*  . Potassium 09/07/2012 4.6   . Chloride 09/07/2012 93*  . CO2 09/07/2012 22   . Glucose, Bld 09/07/2012 92   . BUN 09/07/2012 13   . Creatinine, Ser 09/07/2012 0.97   . Calcium 09/07/2012 9.0   . GFR calc non Af Amer 09/07/2012 89*  . GFR calc Af Amer 09/07/2012 >90       Micro Results: Recent Results (from the past 240 hour(s))  MRSA PCR SCREENING     Status: None   Collection Time    09/06/12 12:12 AM      Result Value Range Status   MRSA  by PCR NEGATIVE  NEGATIVE Final   Comment:            The GeneXpert MRSA Assay (  FDA     approved for NASAL specimens     only), is one component of a     comprehensive MRSA colonization     surveillance program. It is not     intended to diagnose MRSA     infection nor to guide or     monitor treatment for     MRSA infections.   Studies/Results: Ct Head (brain) Wo Contrast  09/05/2012   *RADIOLOGY REPORT*  Clinical Data: Right-sided headache, dizziness and hypertension.  CT HEAD WITHOUT CONTRAST  Technique:  Contiguous axial images were obtained from the base of the skull through the vertex without contrast.  Comparison: 07/15/2012 and prior head CTs dating back to 06/14/2012  Findings: Persistent low density within the right posterior parietal and temporal regions is compatible with evolutionary changes of intraparenchymal hematoma. Overlying craniotomy changes are noted.  Mild chronic small vessel white matter ischemic changes are again noted.  No acute intracranial abnormalities are identified, including mass lesion or mass effect, hydrocephalus, extra-axial fluid collection, midline shift, hemorrhage, or acute infarction.  No significant changes are identified  IMPRESSION: No evidence of acute intracranial abnormality.  Expected evolutionary changes within the right parietal and temporal regions from prior intraparenchymal hematoma .   Original Report Authenticated By: Harmon Pier, M.D.   Medications: I have reviewed the patient's current medications. Scheduled Meds: . amLODipine  10 mg Oral Daily  . atenolol  50 mg Oral Daily  . carbamazepine  200 mg Oral BID  . lisinopril  40 mg Oral Daily  . pantoprazole  40 mg Oral Daily  . sodium chloride  3 mL Intravenous Q12H  . sodium chloride  3 mL Intravenous Q12H   Continuous Infusions:  PRN Meds:.sodium chloride, acetaminophen, acetaminophen, hydrALAZINE, ondansetron (ZOFRAN) IV, ondansetron, sodium chloride,  zolpidem Assessment/Plan: Active Problems:   Malignant hypertension   Hyponatremia   Headache(784.0)   Vomiting   LOS: 2 days   Luis Oconnor is a 57 yo gentleman with PMH significant for recent right temporoparietal intraparenchymal hematoma s/p craniotomy 06/14/12 in neurorehab who presented with severe headache, vomiting, and hypertensive urgency.   Hyponatremia: On presentation, patient's Na was 121, euvolemic. Patient is neurologically stable, with left upper quadrant field defect unchanged from previous hematoma. Differential diagnosis includes SIADH, medications (HCTZ, Ritalin), polydipsia (admits drinking a lot of water daily), vomiting, adrenal insufficiency. 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 Na slowly correcting with fluid restriction and holding HCTZ, Ritalin.  -- Continue holding HCTZ, Ritalin  -- NPO/ice chips --> advance diet today given resolution of symptoms  -- BMET q8h  -- Fluid restrict  -- Stable; move to floor  Hypertensive Urgency: SBP up to 195 in ED, received Lopressor and downtrending to 170s/110s. Holding HCTZ 2/2 hyponatremia. Unsure of baseline BP. Head CT showed appropriate changes from previous hematoma, no acute intracranial processes.  -- Hold HCTZ per above  -- Atenolol 50  -- Lisinopril 40  -- Amlodipine 10  -- Vitals q4h   Asymptomatic Bradycardia: HD #2 patient has been consistently bradycardic to 40-50s but is asymptomatic laying in bed. Has not tried to ambulate. Sitting up, patient denies dizziness, lightheadedness, palpitations. -- Ambulate today to assess for symptoms of bradycardia -- Continue Atenolol for now, reassess if symptomatic with ambulation  Vomiting: Began vomiting after ingestion of teaspoon of undiluted vinegar at home (as home remedy to decrease BP). Vomited 6-7 times, included some coffee ground emesis.  -- Protonix IV 40mg  d/c --> switch to PO  -- Zofran  PRN nausea/vomiting   Headache: On presentation c/o severe  headache. Head CT - no acute intracranial processes. HA resolved. Did -- Tylenol PRN headache.  -- BP checks   Intraparenchymal Hematoma: s/p craniotomy 06/14/12. Is on neurorehab, missed May sessions. Residual deficit: right lower quadrantanopia. No other deficits. Head CT on presentation showed no acute intracranial processes.  -- Continue Tegretol for seizure prophylaxis   Prophylaxis:  -- SCDs    This is a Psychologist, occupational Note.  The care of the patient was discussed with Dr. Eben Burow and the assessment and plan formulated with their assistance.  Please see their attached note for official documentation of the daily encounter.  Claudine Mouton 09/07/2012, 11:19 AM

## 2012-09-07 NOTE — Progress Notes (Signed)
Resident Addendum to Medical Student Note   I have seen and examined the patient, and agree with the the medical student assessment and plan outlined above. Please see my brief note below for additional details.  Christen Bame, MD PGY-1  09/07/2012, 4:10 PM

## 2012-09-07 NOTE — Progress Notes (Signed)
Utilization review completed.  

## 2012-09-07 NOTE — Progress Notes (Signed)
Subjective: Pt doing well this AM. No acute events overnight. Pt BP well controlled on outpt meds. Tolerated advanced diet.   Objective: Vital signs in last 24 hours: Filed Vitals:   09/06/12 1957 09/06/12 2000 09/06/12 2331 09/07/12 0346  BP: 136/81 154/88 147/82 135/81  Pulse: 46 44 45 47  Temp: 98.1 F (36.7 C)  98 F (36.7 C) 98.4 F (36.9 C)  TempSrc: Oral  Oral Oral  Resp: 16 9 19 15   Height:      Weight:      SpO2: 97% 96% 98% 98%   Weight change:   Intake/Output Summary (Last 24 hours) at 09/07/12 0804 Last data filed at 09/07/12 0700  Gross per 24 hour  Intake    790 ml  Output   2675 ml  Net  -1885 ml   General appearance: alert, cooperative and no distress  Head: Normocephalic, without obvious abnormality, atraumatic, well-healed scars from craniotomy  Eyes: negative findings: conjunctivae and sclerae normal and pupils equal, round, reactive to light and accomodation, positive findings: visual field defect RIGHT lower quadrantanopsia  Nose: Nares normal. Septum midline. Mucosa normal. No drainage or sinus tenderness.  Neck: no adenopathy, no JVD and supple, symmetrical, trachea midline  Back: symmetric, no curvature. ROM normal. No CVA tenderness.  Lungs: clear to auscultation bilaterally  Chest wall: no tenderness  Heart: regular rate and rhythm, S1, S2 normal and S4 present  Abdomen: normal findings: bowel sounds normal, no masses palpable, no organomegaly and soft, non-tender and abnormal findings: distended  Extremities: extremities normal, atraumatic, no cyanosis or edema  Pulses: 2+ and symmetric  Skin: Skin color, texture, turgor normal. No rashes or lesions  Lymph nodes: Cervical, supraclavicular, and axillary nodes normal.  Neurologic: Mental status: Alert, oriented, thought content appropriate  Cranial nerves: II: visual field normal, lower quadrantanopia, RIGHT, II: pupils equal, round, reactive to light and accommodation, III,IV,VI: extraocular  muscles extra-ocular motions intact, V: facial light touch sensation normal bilaterally, VII: upper facial muscle function normal bilaterally, VII: lower facial muscle function normal bilaterally, VIII: hearing normal, XI: trapezius strength normal bilaterally, XI: sternocleidomastoid strength normal bilaterally  Sensory: normal  Motor: grossly normal  Coordination: finger to nose normal bilaterally  Lab Results: Basic Metabolic Panel:  Recent Labs Lab 09/06/12 2345 09/07/12 0700  NA 124* 125*  K 3.8 4.6  CL 93* 93*  CO2 23 22  GLUCOSE 117* 92  BUN 13 13  CREATININE 0.87 0.97  CALCIUM 9.3 9.0   Liver Function Tests:  Recent Labs Lab 09/05/12 1942  AST 17  ALT 16  ALKPHOS 99  BILITOT 0.5  PROT 7.3  ALBUMIN 4.0   No results found for this basename: LIPASE, AMYLASE,  in the last 168 hours No results found for this basename: AMMONIA,  in the last 168 hours CBC:  Recent Labs Lab 09/05/12 1942 09/06/12 0520  WBC 10.6* 8.9  NEUTROABS 9.1*  --   HGB 15.4 14.3  HCT 39.4 36.5*  MCV 76.5* 76.4*  PLT 270 235   Cardiac Enzymes:  Recent Labs Lab 09/05/12 1942  TROPONINI <0.30   BNP: No results found for this basename: PROBNP,  in the last 168 hours D-Dimer: No results found for this basename: DDIMER,  in the last 168 hours CBG:  Recent Labs Lab 09/05/12 2142  GLUCAP 131*   Hemoglobin A1C: No results found for this basename: HGBA1C,  in the last 168 hours Fasting Lipid Panel: No results found for this basename: CHOL, HDL, LDLCALC,  TRIG, CHOLHDL, LDLDIRECT,  in the last 168 hours Thyroid Function Tests:  Recent Labs Lab 09/06/12 0015 09/06/12 0725  TSH 0.299*  --   FREET4  --  1.04   Coagulation:  Recent Labs Lab 09/05/12 1942  LABPROT 12.4  INR 0.93   Anemia Panel: No results found for this basename: VITAMINB12, FOLATE, FERRITIN, TIBC, IRON, RETICCTPCT,  in the last 168 hours Urine Drug Screen: Drugs of Abuse  No results found for this  basename: labopia,  cocainscrnur,  labbenz,  amphetmu,  thcu,  labbarb    Alcohol Level: No results found for this basename: ETH,  in the last 168 hours Urinalysis: No results found for this basename: COLORURINE, APPERANCEUR, LABSPEC, PHURINE, GLUCOSEU, HGBUR, BILIRUBINUR, KETONESUR, PROTEINUR, UROBILINOGEN, NITRITE, LEUKOCYTESUR,  in the last 168 hours  Micro Results: Recent Results (from the past 240 hour(s))  MRSA PCR SCREENING     Status: None   Collection Time    09/06/12 12:12 AM      Result Value Range Status   MRSA by PCR NEGATIVE  NEGATIVE Final   Comment:            The GeneXpert MRSA Assay (FDA     approved for NASAL specimens     only), is one component of a     comprehensive MRSA colonization     surveillance program. It is not     intended to diagnose MRSA     infection nor to guide or     monitor treatment for     MRSA infections.   Studies/Results: Ct Head (brain) Wo Contrast  09/05/2012   *RADIOLOGY REPORT*  Clinical Data: Right-sided headache, dizziness and hypertension.  CT HEAD WITHOUT CONTRAST  Technique:  Contiguous axial images were obtained from the base of the skull through the vertex without contrast.  Comparison: 07/15/2012 and prior head CTs dating back to 06/14/2012  Findings: Persistent low density within the right posterior parietal and temporal regions is compatible with evolutionary changes of intraparenchymal hematoma. Overlying craniotomy changes are noted.  Mild chronic small vessel white matter ischemic changes are again noted.  No acute intracranial abnormalities are identified, including mass lesion or mass effect, hydrocephalus, extra-axial fluid collection, midline shift, hemorrhage, or acute infarction.  No significant changes are identified  IMPRESSION: No evidence of acute intracranial abnormality.  Expected evolutionary changes within the right parietal and temporal regions from prior intraparenchymal hematoma .   Original Report Authenticated  By: Harmon Pier, M.D.   Medications: I have reviewed the patient's current medications. Scheduled Meds: . amLODipine  10 mg Oral Daily  . atenolol  50 mg Oral Daily  . carbamazepine  200 mg Oral BID  . lisinopril  40 mg Oral Daily  . pantoprazole (PROTONIX) IV  40 mg Intravenous Daily  . sodium chloride  3 mL Intravenous Q12H  . sodium chloride  3 mL Intravenous Q12H   Continuous Infusions:  PRN Meds:.sodium chloride, acetaminophen, acetaminophen, hydrALAZINE, ondansetron (ZOFRAN) IV, ondansetron, sodium chloride, zolpidem Assessment/Plan: # Hyponatremia- improving: this is likely multifactorial in setting of recent polydipsia, cerebral salt wasting syndrome, HCTZ, and ritalin. Na slowly correcting with fluid restriction and d/c of HCTZ, Ritalin.  -heart diet -- Fluid restrict <1L -Bmet q8hr -PT eval and treat  # Hypertensive Urgency-resolved: SBP up to 195 in ED, received Lopressor and downtrending to 170s/110s. Holding HCTZ 2/2 hyponatremia. Unsure of baseline BP. Head CT no acute intracranial processes with cerebromalacia changes consistent with healing ICH.  -transfer to med-surg -  cont home bp of atenolol, lisinopril, amlodpine -prn hydralazine SBP <190   #Vomiting-resolved: Began vomiting after ingestion of teaspoon of undiluted vinegar at home (as home remedy to decrease BP). Vomited 6-7 times, included some coffee ground emesis.  -po protonix 40mg  BID  - Zofran PRN nausea/vomiting   #Headache likely multifactorial- resolved: On presentation c/o severe headache. Head CT - no acute intracranial processes. HA resolved.  -Tylenol PRN headache.  -neuro checks q shift   # Intraparenchymal Hematoma: s/p craniotomy 06/14/12. Is on neurorehab, missed May sessions. Residual deficit: right lower quadrantanopia. No other deficits. Head CT on presentation showed no acute intracranial processes.  - Continue Tegretol for seizure prophylaxis -d/c ritalin and given significant SE profile  including lowering seizure threshold/HTN/and stroke in setting of recent ICH would NOT restart at d/c   Dispo: Disposition is deferred at this time, awaiting improvement of current medical problems.  Anticipated discharge in approximately 1-2 day(s).   The patient does not have a current PCP (Default, Provider, MD), therefore will not be requiring OPC follow-up after discharge.   The patient does not have transportation limitations that hinder transportation to clinic appointments.  .Services Needed at time of discharge: Y = Yes, Blank = No PT:   OT:   RN:   Equipment:   Other:     LOS: 2 days   Luis Oconnor 09/07/2012, 8:04 AM Pgr:[id number]

## 2012-09-07 NOTE — Progress Notes (Signed)
Report called to [facility] RN pt transferred to [phone] via w/c with belongings, significant other Rose notified of new room. Luis Oconnor

## 2012-09-08 LAB — BASIC METABOLIC PANEL
BUN: 19 mg/dL (ref 6–23)
CO2: 26 mEq/L (ref 19–32)
CO2: 25 mEq/L (ref 19–32)
Calcium: 9.3 mg/dL (ref 8.4–10.5)
Calcium: 9.2 mg/dL (ref 8.4–10.5)
Chloride: 97 mEq/L (ref 96–112)
Creatinine, Ser: 0.98 mg/dL (ref 0.50–1.35)
Creatinine, Ser: 0.97 mg/dL (ref 0.50–1.35)
GFR calc non Af Amer: 89 mL/min — ABNORMAL LOW (ref >90)
Glucose, Bld: 90 mg/dL (ref 70–99)

## 2012-09-08 NOTE — Discharge Summary (Signed)
Internal Medicine Teaching [facility] Discharge Note  Name: Luis Oconnor MRN: [id number] DOB: 31-Oct-1955 57 y.o.  Date of Admission: 09/05/2012  7:21 PM Date of Discharge: 09/09/2012 Attending Physician: Luis Oconnor  Discharge Diagnosis: Active Problems:   Malignant hypertension   Hyponatremia   Headache(784.0)   Vomiting   Discharge Medications:   Medication List    STOP taking these medications       methylphenidate 10 MG tablet  Commonly known as:  RITALIN      TAKE these medications       acetaminophen 325 MG tablet  Commonly known as:  TYLENOL  Take 650 mg by mouth every 6 (six) hours as needed for pain.     amLODipine 10 MG tablet  Commonly known as:  NORVASC  Take 1 tablet (10 mg total) by mouth daily.     atenolol 50 MG tablet  Commonly known as:  TENORMIN  Take 50 mg by mouth daily.     carbamazepine 200 MG 12 hr tablet  Commonly known as:  TEGRETOL XR  Take 1 tablet (200 mg total) by mouth 2 (two) times daily.     hydrochlorothiazide 25 MG tablet  Commonly known as:  HYDRODIURIL  Take 1 tablet (25 mg total) by mouth daily.     lisinopril 40 MG tablet  Commonly known as:  PRINIVIL,ZESTRIL  Take 40 mg by mouth daily.     multivitamin tablet  Take 1 tablet by mouth daily.        Disposition and follow-up:   Mr.Luis Oconnor was discharged from [facility] in Stable condition.  At the hospital follow up visit please address: 1. Repeat Bmet for hyponatremia 2. The discontinuation of ritalin 3. BP management  Follow-up Appointments: Follow-up Information   Follow up with Default, Provider, MD.   Contact information:   [address] [place] [address] [phone]      Discharge Orders   Future Appointments Provider Department Dept Phone   09/28/2012 10:00 AM Ranelle Oyster, MD [facility] Physical Medicine and Rehabilitation [phone]   Future Orders Complete By Expires     Increase activity slowly  As  directed        Consultations:    Procedures Performed:  Ct Head (brain) Wo Contrast  09/05/2012   *RADIOLOGY REPORT*  Clinical Data: Right-sided headache, dizziness and hypertension.  CT HEAD WITHOUT CONTRAST  Technique:  Contiguous axial images were obtained from the base of the skull through the vertex without contrast.  Comparison: 07/15/2012 and prior head CTs dating back to 06/14/2012  Findings: Persistent low density within the right posterior parietal and temporal regions is compatible with evolutionary changes of intraparenchymal hematoma. Overlying craniotomy changes are noted.  Mild chronic small vessel white matter ischemic changes are again noted.  No acute intracranial abnormalities are identified, including mass lesion or mass effect, hydrocephalus, extra-axial fluid collection, midline shift, hemorrhage, or acute infarction.  No significant changes are identified  IMPRESSION: No evidence of acute intracranial abnormality.  Expected evolutionary changes within the right parietal and temporal regions from prior intraparenchymal hematoma .   Original Report Authenticated By: Harmon Pier, M.D.    Admission HPI: Mr. Luis Oconnor is a 57 year old man with a PMH of malignant hypertension and recent admission in April for a temporal intraparenchymal hemorrhage who presents to the [facility] ED with complaints of headache, hypertension, and vomiting. His girlfriend is present at the time of interview. He states that  he has had a headache that started today that he thinks is due to his hypertension. His girlfriend states that he has been lethargic today, requiring her to help feed him, which she hasn't had to do since he came home from Rehab at the beginning of May. She states that he was complaining of spots in his vision and headache most of the day. His family had suggested taking a spoonful of vinegar to get his blood pressure to come down. He tried it and then started vomiting profusely. His girlfriend states  that it was at least 6-7 times. It started out clear with food contents and then after a few times of vomiting turned darker. She states that he has been taking his medications as prescribed for his blood pressure and they last saw his primary care doctor on Wednesday. They have been adjusting his medications to bring her hypertension under control.  On arrival to the ED, a basic metabolic panel indicated marked hyponatremia with a sodium of 121. The patient states that he has been taking his medications and drinking "alot of water." His girlfriend states that over the last day he has drank between 8-10 large glasses of water because of the vomiting.   Hospital Course by problem list: # Hyponatremia 2/2 SIADH: Admission Na 121>>126>>129>>130 at d/c. UOsm >300 therefore making SIADH high on differential. Etiology was likely multifactorial in setting of recent polydipsia, cerebral salt wasting syndrome, HCTZ, and ritalin. Na slowly correcting with fluid restriction and d/c of HCTZ, Ritalin. Extensive education was given and pt was trying to compensate for diuretics.   # Hypertensive Urgency: SBP up to 195 in ED and pt was complaining of HA. Pt received Lopressor and SBP downtrending to 170s/110s. HCTZ was held 2/2 hyponatremia. Unsure of baseline BP. Head CT no acute intracranial processes with cerebromalacia changes consistent with healing ICH. Pts home atenolol, lisinopril, and amlodpine were all continued.   #Vomiting: Began vomiting after ingestion of teaspoon of undiluted vinegar at home (as home remedy to decrease BP). Vomited 6-7 times, included some coffee ground emesis. Pt had stable Hgb throughout admission and no repeat emesis once offending agent of vinegar was removed. He was managed with zofran and po protonix 40mg  BID.  #Headache likely multifactorial: On presentation c/o severe headache. Head CT - no acute intracranial processes. HA resolved w/in 24 hrs and with Tylenol. Neurological exam was  unchanged from baseline throughout admission.  # Intraparenchymal Hematoma: Pt s/p craniotomy 06/14/12. Is participating in neurorehab but had missed May sessions. Residual deficit: right lower quadrantanopia. No other deficits and nothing new noted during admission. Head CT on presentation showed no acute intracranial processes. Continued Tegretol for seizure prophylaxis. Ritalin was discontinued and would be highly cautioned in ever restarting again unless pt has diagnosed psychiatric indication and given significant SE profile including lowering seizure threshold/HTN/and stroke in setting of recent ICH would NOT restart at d/c.    Discharge Vitals:  BP 129/79  Pulse 48  Temp(Src) 98.1 F (36.7 C) (Oral)  Resp 18  Ht 5\' 6"  (1.676 m)  Wt 188 lb 15 oz (85.7 kg)  BMI 30.51 kg/m2  SpO2 100% General appearance: alert, cooperative and no distress  Head: Normocephalic, without obvious abnormality, atraumatic, well-healing scar from R-sided craniotomy 2/2 hemorrhage  Neck: no adenopathy, no JVD and supple, symmetrical, trachea midline  Back: symmetric, no curvature. ROM normal. No CVA tenderness.  Resp: clear to auscultation bilaterally  Cardio: regular rate and rhythm, S1, S2 normal, no murmur,  click, rub or gallop  GI: soft, non-tender; bowel sounds normal; no masses, no organomegaly  Pulses: 2+ and symmetric  Skin: Skin color, texture, turgor normal. No rashes or lesions  Discharge Labs:  CBC    Component Value Date/Time   WBC 8.9 09/06/2012 0520   RBC 4.78 09/06/2012 0520   HGB 14.3 09/06/2012 0520   HCT 36.5* 09/06/2012 0520   PLT 235 09/06/2012 0520   MCV 76.4* 09/06/2012 0520   MCH 29.9 09/06/2012 0520   MCHC 39.2* 09/06/2012 0520   RDW 12.9 09/06/2012 0520   LYMPHSABS 0.9 09/05/2012 1942   MONOABS 0.5 09/05/2012 1942   EOSABS 0.0 09/05/2012 1942   BASOSABS 0.0 09/05/2012 1942   BMET    Component Value Date/Time   NA 131* 09/08/2012 0753   K 4.2 09/08/2012 0753   CL 98 09/08/2012 0753   CO2 25  09/08/2012 0753   GLUCOSE 90 09/08/2012 0753   BUN 16 09/08/2012 0753   CREATININE 0.97 09/08/2012 0753   CALCIUM 9.2 09/08/2012 0753   GFRNONAA 89* 09/08/2012 0753   GFRAA >90 09/08/2012 0753    Signed: Christen Bame 09/09/2012, 10:37 AM   Time Spent on Discharge: 36 min Services Ordered on Discharge: none Equipment Ordered on Discharge: none

## 2012-09-08 NOTE — Progress Notes (Signed)
Pt discharged home by car with family, assessments stable. Pt verbalizes understanding of d/c instructions.

## 2012-09-08 NOTE — Progress Notes (Signed)
Subjective: Pt doing well this AM. No acute events overnight. Pt BP well controlled on outpt meds. Tolerated advanced diet. Was able to ambulate with PT  Objective: Vital signs in last 24 hours: Filed Vitals:   09/07/12 1715 09/07/12 2150 09/08/12 0219 09/08/12 0629  BP: 151/95 158/88 143/86 136/89  Pulse: 48 49 50 46  Temp: 98.1 F (36.7 C) 97.9 F (36.6 C) 98.1 F (36.7 C) 97.7 F (36.5 C)  TempSrc: Oral Oral Oral Oral  Resp: 16 18 18 18   Height: 5\' 6"  (1.676 m)     Weight: 188 lb 15 oz (85.7 kg)     SpO2: 99% 97% 100% 99%   Weight change:   Intake/Output Summary (Last 24 hours) at 09/08/12 0847 Last data filed at 09/08/12 0700  Gross per 24 hour  Intake    603 ml  Output   1325 ml  Net   -722 ml   General appearance: alert, cooperative and no distress  Head: Normocephalic, without obvious abnormality, atraumatic, well-healed scars from craniotomy  Eyes: negative findings: conjunctivae and sclerae normal and pupils equal, round, reactive to light and accomodation, positive findings: visual field defect RIGHT lower quadrantanopsia  Nose: Nares normal. Septum midline. Mucosa normal. No drainage or sinus tenderness.  Neck: no adenopathy, no JVD and supple, symmetrical, trachea midline  Back: symmetric, no curvature. ROM normal. No CVA tenderness.  Lungs: clear to auscultation bilaterally  Chest wall: no tenderness  Heart: regular rate and rhythm, S1, S2 normal and S4 present  Abdomen: normal findings: bowel sounds normal, no masses palpable, no organomegaly and soft, non-tender and abnormal findings: distended  Extremities: extremities normal, atraumatic, no cyanosis or edema  Pulses: 2+ and symmetric  Skin: Skin color, texture, turgor normal. No rashes or lesions  Lymph nodes: Cervical, supraclavicular, and axillary nodes normal.  Neurologic: Mental status: Alert, oriented, thought content appropriate  Cranial nerves: II: visual field normal, lower quadrantanopia, RIGHT,  II: pupils equal, round, reactive to light and accommodation, III,IV,VI: extraocular muscles extra-ocular motions intact, V: facial light touch sensation normal bilaterally, VII: upper facial muscle function normal bilaterally, VII: lower facial muscle function normal bilaterally, VIII: hearing normal, XI: trapezius strength normal bilaterally, XI: sternocleidomastoid strength normal bilaterally  Sensory: normal  Motor: grossly normal  Coordination: finger to nose normal bilaterally  Lab Results: Basic Metabolic Panel:  Recent Labs Lab 09/07/12 1531 09/08/12 0026  NA 129* 130*  K 4.6 4.2  CL 94* 97  CO2 26 26  GLUCOSE 89 97  BUN 18 19  CREATININE 1.14 0.98  CALCIUM 9.2 9.3   Liver Function Tests:  Recent Labs Lab 09/05/12 1942  AST 17  ALT 16  ALKPHOS 99  BILITOT 0.5  PROT 7.3  ALBUMIN 4.0   No results found for this basename: LIPASE, AMYLASE,  in the last 168 hours No results found for this basename: AMMONIA,  in the last 168 hours CBC:  Recent Labs Lab 09/05/12 1942 09/06/12 0520  WBC 10.6* 8.9  NEUTROABS 9.1*  --   HGB 15.4 14.3  HCT 39.4 36.5*  MCV 76.5* 76.4*  PLT 270 235   Cardiac Enzymes:  Recent Labs Lab 09/05/12 1942  TROPONINI <0.30   BNP: No results found for this basename: PROBNP,  in the last 168 hours D-Dimer: No results found for this basename: DDIMER,  in the last 168 hours CBG:  Recent Labs Lab 09/05/12 2142  GLUCAP 131*   Hemoglobin A1C: No results found for this basename: HGBA1C,  in the last 168 hours Fasting Lipid Panel: No results found for this basename: CHOL, HDL, LDLCALC, TRIG, CHOLHDL, LDLDIRECT,  in the last 168 hours Thyroid Function Tests:  Recent Labs Lab 09/06/12 0015 09/06/12 0725  TSH 0.299*  --   FREET4  --  1.04   Coagulation:  Recent Labs Lab 09/05/12 1942  LABPROT 12.4  INR 0.93   Anemia Panel: No results found for this basename: VITAMINB12, FOLATE, FERRITIN, TIBC, IRON, RETICCTPCT,  in the  last 168 hours Urine Drug Screen: Drugs of Abuse  No results found for this basename: labopia,  cocainscrnur,  labbenz,  amphetmu,  thcu,  labbarb    Alcohol Level: No results found for this basename: ETH,  in the last 168 hours Urinalysis: No results found for this basename: COLORURINE, APPERANCEUR, LABSPEC, PHURINE, GLUCOSEU, HGBUR, BILIRUBINUR, KETONESUR, PROTEINUR, UROBILINOGEN, NITRITE, LEUKOCYTESUR,  in the last 168 hours  Micro Results: Recent Results (from the past 240 hour(s))  MRSA PCR SCREENING     Status: None   Collection Time    09/06/12 12:12 AM      Result Value Range Status   MRSA by PCR NEGATIVE  NEGATIVE Final   Comment:            The GeneXpert MRSA Assay (FDA     approved for NASAL specimens     only), is one component of a     comprehensive MRSA colonization     surveillance program. It is not     intended to diagnose MRSA     infection nor to guide or     monitor treatment for     MRSA infections.   Studies/Results: No results found. Medications: I have reviewed the patient's current medications. Scheduled Meds: . amLODipine  10 mg Oral Daily  . atenolol  50 mg Oral Daily  . carbamazepine  200 mg Oral BID  . lisinopril  40 mg Oral Daily  . pantoprazole  40 mg Oral Daily  . sodium chloride  3 mL Intravenous Q12H   Continuous Infusions:  PRN Meds:.sodium chloride, acetaminophen, acetaminophen, hydrALAZINE, ondansetron (ZOFRAN) IV, ondansetron, zolpidem Assessment/Plan: # Hyponatremia 2/2 SIADH- improving: Admission 121>>126>>129>>130. this is likely multifactorial in setting of recent polydipsia, cerebral salt wasting syndrome, HCTZ, and ritalin. Na slowly correcting with fluid restriction and d/c of HCTZ, Ritalin.  -heart diet -- Fluid restrict <1L -Bmet q8hr -PT eval and treat  # Hypertensive Urgency-resolved: SBP up to 195 in ED, received Lopressor and downtrending to 170s/110s. Holding HCTZ 2/2 hyponatremia. Unsure of baseline BP. Head CT no  acute intracranial processes with cerebromalacia changes consistent with healing ICH.  - cont home bp of atenolol, lisinopril, amlodpine -prn hydralazine SBP <190   #Vomiting-resolved: Began vomiting after ingestion of teaspoon of undiluted vinegar at home (as home remedy to decrease BP). Vomited 6-7 times, included some coffee ground emesis.  -po protonix 40mg  BID  - Zofran PRN nausea/vomiting   #Headache likely multifactorial- resolved: On presentation c/o severe headache. Head CT - no acute intracranial processes. HA resolved.  -Tylenol PRN headache.  -neuro checks q shift   # Intraparenchymal Hematoma: s/p craniotomy 06/14/12. Is on neurorehab, missed May sessions. Residual deficit: right lower quadrantanopia. No other deficits. Head CT on presentation showed no acute intracranial processes.  - Continue Tegretol for seizure prophylaxis -d/c ritalin and given significant SE profile including lowering seizure threshold/HTN/and stroke in setting of recent ICH would NOT restart at d/c   Dispo: Disposition is deferred at this time,  awaiting improvement of current medical problems.  Anticipated discharge in approximately 1-2 day(s).   The patient does not have a current PCP (Default, Provider, MD), therefore will not be requiring OPC follow-up after discharge.   The patient does not have transportation limitations that hinder transportation to clinic appointments.  .Services Needed at time of discharge: Y = Yes, Blank = No PT:   OT:   RN:   Equipment:   Other:     LOS: 3 days   Christen Bame 09/08/2012, 8:47 AM Pgr:[phone]

## 2012-09-08 NOTE — Progress Notes (Signed)
Medical Student Daily Progress Note  Subjective:  Patient is feeling well this morning. Of note, he did not need to take any Tylenol yesterday because he did not have any headaches. He is ready for discharge this afternoon.  Constitutional: Denies fever, chills, diaphoresis, appetite change and fatigue.  HEENT: Denies photophobia, eye pain, redness, hearing loss, ear pain, congestion, sore throat, rhinorrhea, sneezing, mouth sores, trouble swallowing, neck pain, neck stiffness and tinnitus.   Respiratory: Denies SOB, DOE, cough, chest tightness, and wheezing.   Cardiovascular: Denies chest pain, palpitations and leg swelling.  Gastrointestinal: Denies nausea, vomiting, abdominal pain, diarrhea, constipation, blood in stool and abdominal distention.  Genitourinary: Denies dysuria, urgency, frequency, hematuria, flank pain and difficulty urinating.  Endocrine: Denies hot or cold intolerance, sweats, changes in hair or nails, polyuria, polydipsia. Musculoskeletal: Denies myalgias, back pain, joint swelling, arthralgias and gait problem.  Skin: Denies pallor, rash and wound.  Neurological: Denies dizziness, seizures, syncope, weakness, light-headedness, numbness and headaches.  Hematological: Denies adenopathy, easy bruising, personal or family bleeding history.  Psychiatric/Behavioral: Denies suicidal ideation, mood changes, confusion, nervousness, sleep disturbance and agitation.    Objective:  Vital signs in last 24 hours: Filed Vitals:   09/07/12 1715 09/07/12 2150 09/08/12 0219 09/08/12 0629  BP: 151/95 158/88 143/86 136/89  Pulse: 48 49 50 46  Temp: 98.1 F (36.7 C) 97.9 F (36.6 C) 98.1 F (36.7 C) 97.7 F (36.5 C)  TempSrc: Oral Oral Oral Oral  Resp: 16 18 18 18   Height: 5\' 6"  (1.676 m)     Weight: 85.7 kg (188 lb 15 oz)     SpO2: 99% 97% 100% 99%    Weight change:    Intake/Output Summary (Last 24 hours) at 09/08/12 0846 Last data filed at 09/08/12 0700  Gross per 24  hour  Intake    603 ml  Output   1325 ml  Net   -722 ml    Physical Exam: BP 129/79  Pulse 48  Temp(Src) 98.1 F (36.7 C) (Oral)  Resp 18  Ht 5\' 6"  (1.676 m)  Wt 85.7 kg (188 lb 15 oz)  BMI 30.51 kg/m2  SpO2 100%  General Appearance:    Alert, cooperative, no distress, appears stated age  Head:    Normocephalic, without obvious abnormality, atraumatic  Eyes:    PERRL, conjunctiva/corneas clear, EOM's intact, fundi    benign, both eyes       Ears:    Normal TM's and external ear canals, both ears  Nose:   Nares normal, septum midline, mucosa normal, no drainage    or sinus tenderness  Throat:   Lips, mucosa, and tongue normal; teeth and gums normal  Neck:   Supple, symmetrical, trachea midline, no adenopathy;       thyroid:  No enlargement/tenderness/nodules; no carotid   bruit or JVD  Back:     Symmetric, no curvature, ROM normal, no CVA tenderness  Lungs:     Clear to auscultation bilaterally, respirations unlabored  Chest wall:    No tenderness or deformity  Heart:    Regular rate and rhythm, S1 and S2 normal, no murmur, rub   or gallop  Abdomen:     Soft, non-tender, bowel sounds active all four quadrants,    no masses, no organomegaly  Genitalia:    Normal male without lesion, discharge or tenderness  Rectal:    Normal tone, normal prostate, no masses or tenderness;   guaiac negative stool  Extremities:   Extremities normal, atraumatic,  no cyanosis or edema  Pulses:   2+ and symmetric all extremities  Skin:   Skin color, texture, turgor normal, no rashes or lesions  Lymph nodes:   Cervical, supraclavicular, and axillary nodes normal  Neurologic:   CNII-XII intact. Normal strength, sensation and reflexes      throughout    Lab Results: Admission on 09/05/2012  Component Date Value Range Status  . Sodium 09/07/2012 125* 135 - 145 mEq/L Final  . Potassium 09/07/2012 4.6  3.5 - 5.1 mEq/L Final  . Chloride 09/07/2012 93* 96 - 112 mEq/L Final  . CO2 09/07/2012 22  19 -  32 mEq/L Final  . Glucose, Bld 09/07/2012 92  70 - 99 mg/dL Final  . BUN 16/12/9602 13  6 - 23 mg/dL Final  . Creatinine, Ser 09/07/2012 0.97  0.50 - 1.35 mg/dL Final  . Calcium 54/11/8117 9.0  8.4 - 10.5 mg/dL Final  . GFR calc non Af Amer 09/07/2012 89* >90 mL/min Final  . GFR calc Af Amer 09/07/2012 >90  >90 mL/min Final  . Sodium 09/07/2012 129* 135 - 145 mEq/L Final  . Potassium 09/07/2012 4.6  3.5 - 5.1 mEq/L Final  . Chloride 09/07/2012 94* 96 - 112 mEq/L Final  . CO2 09/07/2012 26  19 - 32 mEq/L Final  . Glucose, Bld 09/07/2012 89  70 - 99 mg/dL Final  . BUN 14/78/2956 18  6 - 23 mg/dL Final  . Creatinine, Ser 09/07/2012 1.14  0.50 - 1.35 mg/dL Final  . Calcium 21/30/8657 9.2  8.4 - 10.5 mg/dL Final  . GFR calc non Af Amer 09/07/2012 69* >90 mL/min Final  . GFR calc Af Amer 09/07/2012 80* >90 mL/min Final  . Sodium 09/08/2012 130* 135 - 145 mEq/L Final  . Potassium 09/08/2012 4.2  3.5 - 5.1 mEq/L Final  . Chloride 09/08/2012 97  96 - 112 mEq/L Final  . CO2 09/08/2012 26  19 - 32 mEq/L Final  . Glucose, Bld 09/08/2012 97  70 - 99 mg/dL Final  . BUN 84/69/6295 19  6 - 23 mg/dL Final  . Creatinine, Ser 09/08/2012 0.98  0.50 - 1.35 mg/dL Final  . Calcium 28/41/3244 9.3  8.4 - 10.5 mg/dL Final  . GFR calc non Af Amer 09/08/2012 88* >90 mL/min Final  . GFR calc Af Amer 09/08/2012 >90  >90 mL/min Final    Studies/Results: No results found.  Medications: I have reviewed the patient's current medications. Scheduled Meds: . amLODipine  10 mg Oral Daily  . atenolol  50 mg Oral Daily  . carbamazepine  200 mg Oral BID  . lisinopril  40 mg Oral Daily  . pantoprazole  40 mg Oral Daily  . sodium chloride  3 mL Intravenous Q12H   Continuous Infusions:  PRN Meds:.sodium chloride, acetaminophen, acetaminophen, hydrALAZINE, ondansetron (ZOFRAN) IV, ondansetron, zolpidem  Assessment/Plan: Active Problems:   Malignant hypertension   Hyponatremia   Headache(784.0)   Vomiting     LOS: 3 days   Luis Oconnor is a 57 yo gentleman with PMH significant for recent right temporoparietal intraparenchymal hematoma s/p craniotomy 06/14/12 in neurorehab who presented with severe headache, vomiting, and hypertensive urgency.   Hyponatremia: On presentation, patient's Na was 121, euvolemic. Patient is neurologically stable, with left upper quadrant field defect unchanged from previous hematoma. Differential diagnosis includes SIADH, medications (HCTZ, Ritalin), polydipsia (admits drinking a lot of water daily), vomiting, adrenal insufficiency. 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 Na slowly correcting with fluid restriction and holding HCTZ,  Ritalin.  -- Continue holding HCTZ, Ritalin  -- NPO/ice chips --> advance diet today given resolution of symptoms  -- BMET q8h  -- Fluid restrict  -- Stable; move to floor   Hypertensive Urgency: SBP up to 195 in ED, received Lopressor and downtrending to 170s/110s. Holding HCTZ 2/2 hyponatremia. Unsure of baseline BP. Head CT showed appropriate changes from previous hematoma, no acute intracranial processes.  -- Hold HCTZ per above  -- Atenolol 50  -- Lisinopril 40  -- Amlodipine 10  -- Vitals q4h   Asymptomatic Bradycardia: HD #2 patient has been consistently bradycardic to 40-50s but is asymptomatic laying in bed. Has not tried to ambulate. Sitting up, patient denies dizziness, lightheadedness, palpitations.  -- Ambulate today to assess for symptoms of bradycardia  -- Continue Atenolol for now, reassess if symptomatic with ambulation   Vomiting: Began vomiting after ingestion of teaspoon of undiluted vinegar at home (as home remedy to decrease BP). Vomited 6-7 times, included some coffee ground emesis.  -- Protonix IV 40mg  d/c --> switch to PO  -- Zofran PRN nausea/vomiting   Headache: On presentation c/o severe headache. Head CT - no acute intracranial processes. HA resolved.  -- Tylenol PRN headache.  -- BP checks    Intraparenchymal Hematoma: s/p craniotomy 06/14/12. Is on neurorehab, missed May sessions. Residual deficit: right lower quadrantanopia. No other deficits. Head CT on presentation showed no acute intracranial processes.  -- Continue Tegretol for seizure prophylaxis   Prophylaxis:  -- SCDs    This is a Psychologist, occupational Note.  The care of the patient was discussed with Dr. Eben Burow and the assessment and plan formulated with their assistance.  Please see their attached note for official documentation of the daily encounter.  Luis Oconnor 09/08/2012, 8:46 AM

## 2012-09-08 NOTE — Progress Notes (Signed)
Internal Medicine Teaching Service Attending Note Date: 09/08/2012  Patient name: Luis Oconnor  Medical record number: [id number]  Date of birth: December 09, 1955    This patient has been seen and discussed with the house staff. Please see their note for complete details. I concur with their findings with the following additions/corrections: Patient is stable to be discharged home today. His sodium has come up to 129. 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  Lars Mage 09/08/2012, 11:04 AM

## 2012-09-08 NOTE — Discharge Summary (Signed)
Physician Discharge Summary  Patient ID: Luis Oconnor MRN: [id number] DOB/AGE: [birth year] 59 y.o.  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 In the ED he was found to be hyponatremic to 121, asymptomatic. Patient was admitted to the step-down unit under the care of the Internal Medicine Teaching Service.  On presentation, patient was euvolemic and neurologically stable, with left upper quadrant field defect unchanged from previous hematoma. His hyponatremia was concerning for SIADH given recent brain injury and patient reporting polydipsia. Also on the differential diagnosis were medication side effects (HCTZ, Ritalin), vomiting, and adrenal insufficiency. Urine osmolality was 386, suggesting SIADH etiology. Luis Oconnor was initially given IV normal saline without an increase in serum sodium. Patient was then fluid-restricted and HCTZ and Ritalin were held. Patient's serum sodium slowly corrected, and he was moved to the floor unit on HD #2. On the morning of discharge, patient's sodium was 130. Patient was counseled to restrict his fluid intake at home, and he voiced understanding of this.  Luis Oconnor presented with systolic BP up to 195 in ED, for which he received Lopressor with appropriate down-trending response to 170s/110s. Patient admits missing several doses of antihypertensive medications. Because he was experiencing severe headaches and vomiting, a head CT was ordered and was negative for any acute intracranial  processes. CT also showed appropriate changes from previous hematoma. Due to his hyponatremia on presentation, patient's HCTZ was held and he was restarted on his home antihypertensives, atenolol, lisinopril and amlodipine. His blood pressures remained in the 140-150 / 80-90 during admission and were stable on discharge.   On hospital day 2, patient was consistently asymptomatically bradycardic to 40-50s laying in bed. He ambulated after transfer to the floor without dizziness, lightheadedness, or palpitations. He was continued on his atenolol and was stable during admission and on discharge.   Patient had non-bloody, non-bilious vomiting on presentation, which began after ingesting vinegar at home. Later emesis was described as coffee ground appearance. Vomiting was most likely due to vinegar ingestion, however hypertensive crisis could not be ruled out. Additionally on the differential includes gastritis, but this is less likely due to absence of other symptoms. He was kept on oral Protonix for reflux prophylaxis and his vomiting resolved with Zofran. Patient remained without nausea or vomiting during admission and was stable on discharge.  On presentation patient complained of severe headache, which was concerning in the setting of malignant hypertension. Head CT showed no acute intracranial processes with appropriate transformation of previous intraparenchymal hemorrhage. HA resolved with Tylenol. Patient continued to have headaches on hospital day 2 which resolved again with Tylenol. Patient did not experience any headaches afterward and was stable on discharge.   Given patient's recent craniotomy and neurorehab, there was concern for intracranial process on presentation. Head CT was negative for hemorrhage. Patient had residual visual field deficit of left lower quadrant that was unchanged from hemorrhage in May 2014 without any other neurologic deficits. Patient was continued on Tegretol for seizure  prophylaxis and was stable during admission and at discharge.   Patient was given SCDs for venous thromboembolism prophylaxis as heparin or lovenox  was contraindicated given recent brain hemorrhage. He was stable during admission and on discharge.    Discharge Exam: Blood pressure 129/79, pulse 48, temperature 98.1 F (36.7 C), temperature source Oral, resp. rate 18, height 5\' 6"  (1.676 m), weight 85.7 kg (188 lb 15 oz), SpO2 100.00%. General appearance: alert, cooperative and no distress Head: Normocephalic, without obvious abnormality, atraumatic, well-healing scar from R-sided craniotomy 2/2 hemorrhage Neck: no adenopathy, no JVD and supple, symmetrical, trachea midline Back: symmetric, no curvature. ROM normal. No CVA tenderness. Resp: clear to auscultation bilaterally Cardio: regular rate and rhythm, S1, S2 normal, no murmur, click, rub or gallop GI: soft, non-tender; bowel sounds normal; no masses,  no organomegaly Pulses: 2+ and symmetric Skin: Skin color, texture, turgor normal. No rashes or lesions  Disposition: 01-Home or Self Care   Future Appointments Provider Department Dept Phone   09/28/2012 10:00 AM Ranelle Oyster, MD [facility] [phone]       Medication List    ASK your doctor about these medications       acetaminophen 325 MG tablet  Commonly known as:  TYLENOL  Take 650 mg by mouth every 6 (six) hours as needed for pain.     amLODipine 10 MG tablet  Commonly known as:  NORVASC  Take 1 tablet (10 mg total) by mouth daily.     atenolol 50 MG tablet  Commonly known as:  TENORMIN  Take 50 mg by mouth daily.     carbamazepine 200 MG 12 hr tablet  Commonly known as:  TEGRETOL XR  Take 1 tablet (200 mg total) by mouth 2 (two) times daily.     hydrochlorothiazide 25 MG tablet  Commonly known as:  HYDRODIURIL  Take 1 tablet (25 mg total) by mouth daily.     lisinopril 40 MG tablet  Commonly known as:   PRINIVIL,ZESTRIL  Take 40 mg by mouth daily.     methylphenidate 10 MG tablet  Commonly known as:  RITALIN  Take 1 tablet (10 mg total) by mouth 2 (two) times daily with breakfast and lunch.     multivitamin tablet  Take 1 tablet by mouth daily.         Signed: Claudine Mouton 09/08/2012, 10:19 AM

## 2012-09-08 NOTE — Progress Notes (Signed)
Physical Therapy Treatment Patient Details Name: SHADI SESSLER MRN: [id number] DOB: 1955/12/05 Today's Date: 09/08/2012 Time: 1025-1040 PT Time Calculation (min): 15 min  PT Assessment / Plan / Recommendation Comments on Treatment Session  Pt making steady progress with mobility.  Discussed with pt importance of increasing activity.      Follow Up Recommendations  Home health PT     Does the patient have the potential to tolerate intense rehabilitation     Barriers to Discharge        Equipment Recommendations  None recommended by PT    Recommendations for Other Services    Frequency Min 4X/week   Plan      Precautions / Restrictions Restrictions Weight Bearing Restrictions: No       Mobility  Bed Mobility Bed Mobility: Supine to Sit;Sitting - Scoot to Edge of Bed Supine to Sit: 6: Modified independent (Device/Increase time) Sitting - Scoot to Edge of Bed: 6: Modified independent (Device/Increase time) Transfers Transfers: Sit to Stand;Stand to Sit Sit to Stand: 6: Modified independent (Device/Increase time);With upper extremity assist;Without upper extremity assist;From bed;From chair/3-in-1;With armrests Stand to Sit: 6: Modified independent (Device/Increase time);With armrests;With upper extremity assist;Without upper extremity assist;To chair/3-in-1 Ambulation/Gait Ambulation/Gait Assistance: 4: Min guard Ambulation Distance (Feet): 140 Feet Assistive device: Rolling walker Ambulation/Gait Assistance Details: Cues for increase floor clearance & decreased reliance of UE's on RW.    Gait Pattern: Step-through pattern;Decreased stride length;Decreased hip/knee flexion - right;Decreased hip/knee flexion - left General Gait Details: Performed standing marching to facilitate increased hip/knee flexion x 10  Stairs: No Wheelchair Mobility Wheelchair Mobility: No    Exercises Other Exercises Other Exercises: sit<>stand x 5 reps without UE support for strengthening &  activity tolerance.       PT Goals Acute Rehab PT Goals Time For Goal Achievement: 09/14/12 Potential to Achieve Goals: Good Pt will Transfer Bed to Chair/Chair to Bed: with modified independence Pt will Ambulate: 51 - 150 feet;with modified independence PT Goal: Ambulate - Progress: Progressing toward goal Pt will Perform Home Exercise Program: with supervision, verbal cues required/provided  Visit Information  Last PT Received On: 09/08/12 Assistance Needed: +1    Subjective Data      Cognition  Cognition Arousal/Alertness: Awake/alert Behavior During Therapy: WFL for tasks assessed/performed Overall Cognitive Status: Within Functional Limits for tasks assessed    Balance     End of Session PT - End of Session Equipment Utilized During Treatment: Gait belt Activity Tolerance: Patient tolerated treatment well Patient left: in chair;with call bell/phone within reach Nurse Communication: Mobility status     Verdell Face, [place] [phone] 09/08/2012

## 2012-09-09 NOTE — Discharge Summary (Signed)
Resident Addendum to Medical Student Note   I have seen and examined the patient, and agree with the the medical student assessment and plan outlined above. Please see my brief note  for additional details.  Luis Oconnor  09/09/2012, 10:57 AM

## 2012-09-11 NOTE — ED Provider Notes (Signed)
History     CSN: [id number]  Arrival date & time 09/05/12  4696   First MD Initiated Contact with Patient 09/05/12 1929      Chief Complaint  Patient presents with  . Dizziness      HPI Presents with dizziness, blurred vision, headache, confusion, nausea and vomiting that began at 9 am today. Pt his post brain surgery from aneurysm on March 15 per caregiver. He is alert, oriented, answering all questions approriately, denies weakness  Has been noncompliant with BP medication Did ingest vinegar prior to onset of vomiting.    Past Medical History  Diagnosis Date  . Hypertension   . Ulcer   . Brain aneurysm   . Epistaxis, recurrent     Past Surgical History  Procedure Laterality Date  . Craniotomy Right 06/14/2012    Procedure: CRANIOTOMY HEMATOMA EVACUATION SUBDURAL;  Surgeon: [name], MD;  Location: [facility];  Service: Neurosurgery;  Laterality: Right;    Family History  Problem Relation Age of Onset  . Hypertension Mother   . Hypertension Father   . Cancer Brother     leukemia  . Renal Disease Brother     History  Substance Use Topics  . Smoking status: Never Smoker   . Smokeless tobacco: Not on file  . Alcohol Use: No      Review of Systems  Constitutional: Negative for fever and chills.  Cardiovascular: Negative for chest pain.  Neurological: Negative for seizures.  All other systems reviewed and are negative.    Allergies  Clonidine derivatives  Home Medications   Current Outpatient Rx  Name  Route  Sig  Dispense  Refill  . acetaminophen (TYLENOL) 325 MG tablet   Oral   Take 650 mg by mouth every 6 (six) hours as needed for pain.         Marland Kitchen amLODipine (NORVASC) 10 MG tablet   Oral   Take 1 tablet (10 mg total) by mouth daily.   30 tablet   1   . atenolol (TENORMIN) 50 MG tablet   Oral   Take 50 mg by mouth daily.         . carbamazepine (TEGRETOL XR) 200 MG 12 hr tablet   Oral   Take 1 tablet (200 mg total) by mouth 2  (two) times daily.   60 tablet   1   . hydrochlorothiazide (HYDRODIURIL) 25 MG tablet   Oral   Take 1 tablet (25 mg total) by mouth daily.   30 tablet   3   . lisinopril (PRINIVIL,ZESTRIL) 40 MG tablet   Oral   Take 40 mg by mouth daily.         . Multiple Vitamins-Minerals (MULTIVITAMIN) tablet   Oral   Take 1 tablet by mouth daily.           BP 129/79  Pulse 48  Temp(Src) 98.1 F (36.7 C) (Oral)  Resp 18  Ht 5\' 6"  (1.676 m)  Wt 188 lb 15 oz (85.7 kg)  BMI 30.51 kg/m2  SpO2 100%  Physical Exam  Nursing note and vitals reviewed. Constitutional: He is oriented to person, place, and time. He appears well-developed and well-nourished. No distress.  HENT:  Head: Normocephalic and atraumatic.  Eyes: Pupils are equal, round, and reactive to light.  Neck: Normal range of motion.  Cardiovascular: Normal rate and intact distal pulses.   Pulmonary/Chest: No respiratory distress.  Abdominal: Normal appearance. He exhibits no distension. There is no tenderness.  There is no rebound.  Musculoskeletal: Normal range of motion.  Neurological: He is alert and oriented to person, place, and time. No cranial nerve deficit.  Skin: Skin is warm and dry. No rash noted.  Psychiatric: He has a normal mood and affect. His behavior is normal.    ED Course  Procedures (including critical care time)  Labs Reviewed  CBC - Abnormal; Notable for the following:    WBC 10.6 (*)    MCV 76.5 (*)    MCHC 39.1 (*)    All other components within normal limits  DIFFERENTIAL - Abnormal; Notable for the following:    Neutrophils Relative % 86 (*)    Neutro Abs 9.1 (*)    Lymphocytes Relative 8 (*)    All other components within normal limits  COMPREHENSIVE METABOLIC PANEL - Abnormal; Notable for the following:    Sodium 121 (*)    Chloride 85 (*)    Glucose, Bld 128 (*)    All other components within normal limits  GLUCOSE, CAPILLARY - Abnormal; Notable for the following:     Glucose-Capillary 131 (*)    All other components within normal limits  CBC - Abnormal; Notable for the following:    HCT 36.5 (*)    MCV 76.4 (*)    MCHC 39.2 (*)    All other components within normal limits  OSMOLALITY, URINE - Abnormal; Notable for the following:    Osmolality, Ur 386 (*)    All other components within normal limits  OSMOLALITY - Abnormal; Notable for the following:    Osmolality 255 (*)    All other components within normal limits  TSH - Abnormal; Notable for the following:    TSH 0.299 (*)    All other components within normal limits  BASIC METABOLIC PANEL - Abnormal; Notable for the following:    Sodium 120 (*)    Chloride 86 (*)    Glucose, Bld 122 (*)    All other components within normal limits            Ct Head (brain) Wo Contrast  09/05/2012   *RADIOLOGY REPORT*  Clinical Data: Right-sided headache, dizziness and hypertension.  CT HEAD WITHOUT CONTRAST  Technique:  Contiguous axial images were obtained from the base of the skull through the vertex without contrast.  Comparison: 07/15/2012 and prior head CTs dating back to 06/14/2012  Findings: Persistent low density within the right posterior parietal and temporal regions is compatible with evolutionary changes of intraparenchymal hematoma. Overlying craniotomy changes are noted.  Mild chronic small vessel white matter ischemic changes are again noted.  No acute intracranial abnormalities are identified, including mass lesion or mass effect, hydrocephalus, extra-axial fluid collection, midline shift, hemorrhage, or acute infarction.  No significant changes are identified  IMPRESSION: No evidence of acute intracranial abnormality.  Expected evolutionary changes within the right parietal and temporal regions from prior intraparenchymal hematoma .   Original Report Authenticated By: Harmon Pier, M.D.      1. Hyponatremia   2. Hypertension   3. Cerebral parenchymal hemorrhage   4. Headache(784.0)   5. HTN  (hypertension)   6. Vomiting       MDM          Nelia Shi, MD 09/11/12 [phone]

## 2012-09-24 ENCOUNTER — Telehealth: Payer: Self-pay | Admitting: Licensed Clinical Social Worker

## 2012-09-24 NOTE — Telephone Encounter (Signed)
This pt is unknown to this CSW. CSW received call to Ms. Jacquelin Hawking indicating pt has an appt on 6/30 with Dr. Riley Kill.  Family can not find phone number to Dr. Riley Kill' office.  Pt needs to reschedule due to having to attend a family funeral.  CSW provided family with phone number to Dr. Riley Kill as listed on pt's Discharge Summary.

## 2012-09-28 ENCOUNTER — Encounter: Payer: Medicaid Other | Admitting: Physical Medicine & Rehabilitation

## 2012-10-24 ENCOUNTER — Emergency Department (HOSPITAL_COMMUNITY): Payer: Medicaid Other

## 2012-10-24 ENCOUNTER — Encounter (HOSPITAL_COMMUNITY): Payer: Self-pay | Admitting: *Deleted

## 2012-10-24 ENCOUNTER — Inpatient Hospital Stay (HOSPITAL_COMMUNITY)
Admission: EM | Admit: 2012-10-24 | Discharge: 2012-10-27 | DRG: 305 | Disposition: A | Discharge status: Home / Self Care | Payer: Medicaid Other | Attending: Internal Medicine | Admitting: Internal Medicine

## 2012-10-24 DIAGNOSIS — I160 Hypertensive urgency: Secondary | ICD-10-CM

## 2012-10-24 DIAGNOSIS — I10 Essential (primary) hypertension: Principal | ICD-10-CM | POA: Diagnosis present

## 2012-10-24 DIAGNOSIS — R519 Headache, unspecified: Secondary | ICD-10-CM | POA: Diagnosis present

## 2012-10-24 DIAGNOSIS — R51 Headache: Secondary | ICD-10-CM | POA: Diagnosis present

## 2012-10-24 LAB — CBC WITH DIFFERENTIAL/PLATELET
Eosinophils Relative: 2 % (ref 0–5)
HCT: 42.3 % (ref 39.0–52.0)
Lymphocytes Relative: 9 % — ABNORMAL LOW (ref 12–46)
Lymphs Abs: 0.9 10*3/uL (ref 0.7–4.0)
MCV: 83.9 fL (ref 78.0–100.0)
Monocytes Absolute: 0.4 10*3/uL (ref 0.1–1.0)
Neutro Abs: 8.2 10*3/uL — ABNORMAL HIGH (ref 1.7–7.7)
Platelets: 200 10*3/uL (ref 150–400)
RBC: 5.04 MIL/uL (ref 4.22–5.81)
WBC: 9.8 10*3/uL (ref 4.0–10.5)

## 2012-10-24 LAB — RAPID URINE DRUG SCREEN, HOSP PERFORMED
Amphetamines: NOT DETECTED
Benzodiazepines: NOT DETECTED
Cocaine: NOT DETECTED
Opiates: NOT DETECTED

## 2012-10-24 LAB — URINALYSIS, ROUTINE W REFLEX MICROSCOPIC
Glucose, UA: NEGATIVE mg/dL
Hgb urine dipstick: NEGATIVE
Leukocytes, UA: NEGATIVE
Protein, ur: NEGATIVE mg/dL
pH: 7.5 (ref 5.0–8.0)

## 2012-10-24 LAB — TROPONIN I: Troponin I: <0.3 ng/mL (ref <0.30)

## 2012-10-24 LAB — BASIC METABOLIC PANEL
CO2: 29 mEq/L (ref 19–32)
Calcium: 9.8 mg/dL (ref 8.4–10.5)
Chloride: 100 mEq/L (ref 96–112)
Glucose, Bld: 110 mg/dL — ABNORMAL HIGH (ref 70–99)
Sodium: 136 mEq/L (ref 135–145)

## 2012-10-24 MED ORDER — ATENOLOL 50 MG PO TABS
50.0000 mg | ORAL_TABLET | Freq: Every day | ORAL | Status: DC
  Administered 2012-10-26 – 2012-10-27 (×2): 50 mg via ORAL
  Filled 2012-10-24 (×3): qty 1

## 2012-10-24 MED ORDER — HYDRALAZINE HCL 20 MG/ML IJ SOLN
10.0000 mg | INTRAMUSCULAR | Status: DC | PRN
  Administered 2012-10-25: 10 mg via INTRAVENOUS
  Filled 2012-10-24: qty 1

## 2012-10-24 MED ORDER — SODIUM CHLORIDE 0.9 % IV SOLN
INTRAVENOUS | Status: DC
  Administered 2012-10-24: 1000 mL via INTRAVENOUS

## 2012-10-24 MED ORDER — ONDANSETRON HCL 4 MG/2ML IJ SOLN: 4.0000 mg | Freq: Four times a day (QID) | INTRAMUSCULAR | Status: DC | PRN

## 2012-10-24 MED ORDER — FENTANYL CITRATE 0.05 MG/ML IJ SOLN
100.0000 ug | Freq: Once | INTRAMUSCULAR | Status: AC
  Administered 2012-10-24: 100 ug via INTRAVENOUS
  Filled 2012-10-24 (×2): qty 2

## 2012-10-24 MED ORDER — ACETAMINOPHEN 325 MG PO TABS
650.0000 mg | ORAL_TABLET | Freq: Four times a day (QID) | ORAL | Status: DC | PRN
  Administered 2012-10-25 – 2012-10-27 (×4): 650 mg via ORAL
  Filled 2012-10-24 (×4): qty 2

## 2012-10-24 MED ORDER — MORPHINE SULFATE 2 MG/ML IJ SOLN
1.0000 mg | INTRAMUSCULAR | Status: DC | PRN
  Administered 2012-10-24 – 2012-10-25 (×4): 1 mg via INTRAVENOUS
  Filled 2012-10-24 (×4): qty 1

## 2012-10-24 MED ORDER — NICARDIPINE HCL IN NACL 20-0.86 MG/200ML-% IV SOLN
5.0000 mg/h | Freq: Once | INTRAVENOUS | Status: AC
  Administered 2012-10-24: 5 mg/h via INTRAVENOUS
  Filled 2012-10-24: qty 200

## 2012-10-24 MED ORDER — ONDANSETRON HCL 4 MG PO TABS
4.0000 mg | ORAL_TABLET | Freq: Four times a day (QID) | ORAL | Status: DC | PRN
  Administered 2012-10-25: 4 mg via ORAL
  Filled 2012-10-24: qty 1

## 2012-10-24 MED ORDER — LABETALOL HCL 5 MG/ML IV SOLN: 10.0000 mg | INTRAVENOUS | Status: DC | PRN

## 2012-10-24 MED ORDER — SODIUM CHLORIDE 0.9 % IV SOLN: INTRAVENOUS | Status: DC

## 2012-10-24 MED ORDER — AMLODIPINE BESYLATE 10 MG PO TABS
10.0000 mg | ORAL_TABLET | Freq: Every day | ORAL | Status: DC
Start: 2012-10-25 — End: 2012-10-27
  Administered 2012-10-25 – 2012-10-27 (×3): 10 mg via ORAL
  Filled 2012-10-24 (×3): qty 1

## 2012-10-24 MED ORDER — CARBAMAZEPINE ER 200 MG PO TB12
200.0000 mg | ORAL_TABLET | Freq: Two times a day (BID) | ORAL | Status: DC
  Administered 2012-10-24 – 2012-10-27 (×6): 200 mg via ORAL
  Filled 2012-10-24 (×7): qty 1

## 2012-10-24 MED ORDER — LISINOPRIL 40 MG PO TABS
40.0000 mg | ORAL_TABLET | Freq: Every day | ORAL | Status: DC
  Administered 2012-10-25 – 2012-10-27 (×3): 40 mg via ORAL
  Filled 2012-10-24 (×3): qty 1

## 2012-10-24 MED ORDER — SODIUM CHLORIDE 0.9 % IJ SOLN
3.0000 mL | Freq: Two times a day (BID) | INTRAMUSCULAR | Status: DC
  Administered 2012-10-24 – 2012-10-27 (×4): 3 mL via INTRAVENOUS

## 2012-10-24 MED ORDER — LABETALOL HCL 100 MG PO TABS
100.0000 mg | ORAL_TABLET | Freq: Three times a day (TID) | ORAL | Status: DC
  Administered 2012-10-24 – 2012-10-27 (×6): 100 mg via ORAL
  Filled 2012-10-24 (×10): qty 1

## 2012-10-24 MED ORDER — ACETAMINOPHEN 650 MG RE SUPP: 650.0000 mg | Freq: Four times a day (QID) | RECTAL | Status: DC | PRN

## 2012-10-24 NOTE — ED Notes (Signed)
Pt from home. Has a history of hypertension. Had a brain aneurysm and evacuation on 06/15/2012. Pt states ever since brain surgery, he has had a hard time controlling his BP. Pt states he started developing a severe headache and seeing a "windmill full of colors" around 11am and wife called EMS. Pt rates headache 10/10 with some nausea earlier, but denies nausea at this time.

## 2012-10-24 NOTE — ED Provider Notes (Signed)
CSN: [id number]     Arrival date & time 10/24/12  1755 History     First MD Initiated Contact with Patient 10/24/12 1813     Chief Complaint  Patient presents with  . Headache  . Hypertension   (Consider location/radiation/quality/duration/timing/severity/associated sxs/prior Treatment) The history is provided by the patient and a relative. No language interpreter was used.    Luis Oconnor is a(n) 57 y.o. male who presents with chief complaint of high blood pressure and headache.  This patient has a past medical history of brain aneurysm with craniotomy and hematoma about 2 weeks in in March of 2014.  He states that since that time he has had significant difficulty regulating his blood pressure.  He is currently taking 5 medications for blood pressure control.  This morning around 11:00 he had sudden onset severe headache.  He states that his headache was accompanied by visual hallucination of colorful pinwheel's.  He states he reached out to move them out of his field of vision although he knew that they were not real.  His wife who accompanies him states that he had a 10 minute period where he was "talking out of his head."  She took his blood pressure and realized his systolic with a greater than 190 and decided to bring him here to the emergency department for further evaluation. Patient denies any unilateral weakness, difficulty understanding speech, difficulty swallowing.  The wife noticed no unilateral facial droop.  He states that he is at his mental baseline currently   Past Medical History  Diagnosis Date  . Hypertension   . Ulcer   . Brain aneurysm   . Epistaxis, recurrent    Past Surgical History  Procedure Laterality Date  . Craniotomy Right 06/14/2012    Procedure: CRANIOTOMY HEMATOMA EVACUATION SUBDURAL;  Surgeon: [name], MD;  Location: [facility];  Service: Neurosurgery;  Laterality: Right;   Family History  Problem Relation Age of Onset  . Hypertension  Mother   . Hypertension Father   . Cancer Brother     leukemia  . Renal Disease Brother    History  Substance Use Topics  . Smoking status: Never Smoker   . Smokeless tobacco: Not on file  . Alcohol Use: No    Review of Systems Ten systems reviewed and are negative for acute change, except as noted in the HPI.   Allergies  Clonidine derivatives  Home Medications   Current Outpatient Rx  Name  Route  Sig  Dispense  Refill  . amLODipine (NORVASC) 10 MG tablet   Oral   Take 1 tablet (10 mg total) by mouth daily.   30 tablet   1   . atenolol (TENORMIN) 50 MG tablet   Oral   Take 50 mg by mouth daily.         . carbamazepine (TEGRETOL XR) 200 MG 12 hr tablet   Oral   Take 1 tablet (200 mg total) by mouth 2 (two) times daily.   60 tablet   1   . labetalol (NORMODYNE) 100 MG tablet   Oral   Take 100 mg by mouth 3 (three) times daily.         Marland Kitchen lisinopril (PRINIVIL,ZESTRIL) 40 MG tablet   Oral   Take 40 mg by mouth daily.         . Multiple Vitamins-Minerals (MULTIVITAMIN) tablet   Oral   Take 1 tablet by mouth daily.  BP 201/108  Pulse 55  Temp(Src) 98.3 F (36.8 C) (Oral)  Resp 16  SpO2 99% Physical Exam Physical Exam  Nursing note and vitals reviewed. Constitutional: He appears well-developed and well-nourished. No distress.  HENT:  Head: Normocephalic and atraumatic.  Eyes: Conjunctivae normal are normal. No scleral icterus.  Neck: Normal range of motion. Neck supple.  Cardiovascular: Normal rate, regular rhythm and normal heart sounds.   Pulmonary/Chest: Effort normal and breath sounds normal. No respiratory distress.  Abdominal: Soft. There is no tenderness.  Musculoskeletal: He exhibits no edema.  Neurological: He is alert.  is oriented to person place time and event. Patient has some difficulty following commands. Speech is clear and goal oriented, follows commands Major Cranial nerves without deficit, no facial droop Symmetric  weakness upper and lower extremities bilaterally in strong and equal grip strength Sensation normal to light and sharp touch Moves extremities without ataxia, coordination intact Skin: Skin is warm and dry. He is not diaphoretic.  Psychiatric: His behavior is normal.    ED Course   Procedures (including critical care time)  Labs Reviewed  CBC WITH DIFFERENTIAL  BASIC METABOLIC PANEL  URINALYSIS, ROUTINE W REFLEX MICROSCOPIC  TROPONIN I  URINE RAPID DRUG SCREEN (HOSP PERFORMED)   No results found. No diagnosis found.   Date: 10/24/2012  Rate: 55  Rhythm: normal sinus rhythm  QRS Axis: normal  Intervals: normal  ST/T Wave abnormalities: t wave inversionlateral leads  Conduction Disutrbances:none  Narrative Interpretation:   Old EKG Reviewed: Changes form previous EKG (09/07/2012) shows t wave inversions lateral leads   MDM  7:11 PM BP 201/108  Pulse 55  Temp(Src) 98.3 F (36.8 C) (Oral)  Resp 16  SpO2 99% Patient with a concerning medical history of ruptured aneurysm and craniotomy this past March.  He comes in today with sudden onset severe headache.  His headache is now an 8/10.  He also has significant hypertension in the range of possible hypertensive urgency or emergency.  Labs are currently pending.  I've ordered CT of the head, chest x-ray, EKG, cardiac markers and basic labs.  7:40 PM Filed Vitals:   10/24/12 1845 10/24/12 1850 10/24/12 1930 10/24/12 1935  BP: 201/105 185/108 145/91 142/85  Pulse: 55 55    Temp:      TempSrc:      Resp: 15 13  14   SpO2: 99% 97%     Patient blood pressure down, not greater than 25%   7:58 PM Patient continues to c/o headache. I believe the patient will need admission for hypertensive urgency, His troponin is negative, hower there are chnages suggesting cardiac ischemia on the EKG. Patient will be given fentanyl for headache.   8:44 PM I spoke with Dr. Toniann Fail who states that patient will need to be admitted by  critical care as he is on cardene.  I spoke with Dr. Blima Dessert who states that the patient does not qualify for ccm. I have spoken again with dr. Toniann Fail, Cardene is discontinued. Patient will be admitted to stepdown for hypertensive urgency.  Arthor Captain, PA-C 10/25/12 [phone]

## 2012-10-25 DIAGNOSIS — I10 Essential (primary) hypertension: Secondary | ICD-10-CM

## 2012-10-25 LAB — COMPREHENSIVE METABOLIC PANEL
ALT: 13 U/L (ref 0–53)
AST: 14 U/L (ref 0–37)
Calcium: 9.1 mg/dL (ref 8.4–10.5)
Creatinine, Ser: 0.94 mg/dL (ref 0.50–1.35)
GFR calc Af Amer: >90 mL/min (ref >90)
Glucose, Bld: 91 mg/dL (ref 70–99)
Sodium: 136 mEq/L (ref 135–145)
Total Protein: 6.2 g/dL (ref 6.0–8.3)

## 2012-10-25 LAB — CBC
HCT: 39.2 % (ref 39.0–52.0)
Hemoglobin: 14.4 g/dL (ref 13.0–17.0)
MCHC: 36.7 g/dL — ABNORMAL HIGH (ref 30.0–36.0)
RBC: 4.71 MIL/uL (ref 4.22–5.81)

## 2012-10-25 MED ORDER — HYDRALAZINE HCL 10 MG PO TABS
10.0000 mg | ORAL_TABLET | Freq: Three times a day (TID) | ORAL | Status: DC
  Administered 2012-10-25 – 2012-10-26 (×3): 10 mg via ORAL
  Filled 2012-10-25 (×6): qty 1

## 2012-10-25 NOTE — Progress Notes (Signed)
[facility] PROGRESS NOTE  KAUSHIK MAUL RUE:[id number] DOB: [birth year] DOA: 10/24/2012 PCP: August Saucer ERIC, MD  Assessment/Plan: Accelerated hypertension  - presently patient is off Cardene drip.  - Cont with PRN  IV labetalol and hydralazine - Continue his home medication - Will add hydralazine q8hrs, observe blood pressure trends.   Headache   - likely related to blood pressure.   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  Code Status: Full Family Communication: Pt in room (indicate person spoken with, relationship, and if by phone, the number) Disposition Plan: Pending  HPI/Subjective: No acute events noted overnight. On further questioning, pt admits to sbp normally in the 180's.  Objective: Filed Vitals:   10/25/12 0200 10/25/12 0517 10/25/12 0925 10/25/12 0957  BP: 160/99 156/91  168/100  Pulse: 61 52 48 54  Temp: 98.1 F (36.7 C) 98.1 F (36.7 C)  97.2 F (36.2 C)  TempSrc:  Oral  Oral  Resp: 18 16  18   Height:      Weight:      SpO2: 98% 98%  100%    Intake/Output Summary (Last 24 hours) at 10/25/12 1153 Last data filed at 10/25/12 0900  Gross per 24 hour  Intake    123 ml  Output    425 ml  Net   -302 ml   Filed Weights   10/24/12 2308  Weight: 69.4 kg (153 lb)    Exam:   General:  Awake, in nad  Cardiovascular: regular, s1, s2  Respiratory: normal resp effort  Abdomen: soft,nondistended  Musculoskeletal: perfused, no clubbing   Data Reviewed: Basic Metabolic Panel:  Recent Labs Lab 10/24/12 1849 10/25/12 0603  NA 136 136  K 4.0 3.4*  CL 100 102  CO2 29 25  GLUCOSE 110* 91  BUN 14 12  CREATININE 1.00 0.94  CALCIUM 9.8 9.1   Liver Function Tests:  Recent Labs Lab 10/25/12 0603  AST 14  ALT 13  ALKPHOS 73  BILITOT 0.3  PROT 6.2  ALBUMIN 3.3*   No results found for this basename: LIPASE, AMYLASE,  in the last 168 hours No results found for this basename: AMMONIA,  in the last 168  hours CBC:  Recent Labs Lab 10/24/12 1849 10/25/12 0603  WBC 9.8 6.2  NEUTROABS 8.2*  --   HGB 15.4 14.4  HCT 42.3 39.2  MCV 83.9 83.2  PLT 200 193   Cardiac Enzymes:  Recent Labs Lab 10/24/12 1917  TROPONINI <0.30   BNP (last 3 results) No results found for this basename: PROBNP,  in the last 8760 hours CBG: No results found for this basename: GLUCAP,  in the last 168 hours  No results found for this or any previous visit (from the past 240 hour(s)).   Studies: Dg Chest 2 View  10/24/2012   *RADIOLOGY REPORT*  Clinical Data: Shortness of breath  CHEST - 2 VIEW  Comparison: 06/14/2012  Findings: Lungs are clear. No pleural effusion or pneumothorax.  Apparent opacity at the left lung base reflects prominent epicardial fat.  Mild cardiomegaly.  Mild degenerative changes of the visualized thoracolumbar spine.  IMPRESSION: No evidence of acute cardiopulmonary disease.   Original Report Authenticated By: Charline Bills, M.D.   Ct Head Wo Contrast  10/24/2012   *RADIOLOGY REPORT*  Clinical Data: Severe headache and dizziness.  Previous craniotomy.  CT HEAD WITHOUT CONTRAST  Technique:  Contiguous axial images were obtained from the base of the skull through the vertex without contrast.  Comparison: 09/05/2012  and multiple previous  Findings: There has been previous right parietal craniotomy.  There is atrophy of the underlying brain with encephalomalacia and adjacent gliosis.  Elsewhere, the brain shows some chronic small vessel changes in the deep white matter and basal ganglia.  No sign of acute infarction, mass lesion, hemorrhage, hydrocephalus or extra-axial collection.  Sinuses are clear.  IMPRESSION: No acute finding.  Previous right parietal craniotomy.  Atrophy and gliosis in the right temporal and parietal region at the site of previous hemorrhage.   Original Report Authenticated By: Paulina Fusi, M.D.    Scheduled Meds: . amLODipine  10 mg Oral Daily  . atenolol  50 mg Oral  Daily  . carbamazepine  200 mg Oral BID  . hydrALAZINE  10 mg Oral Q8H  . labetalol  100 mg Oral TID  . lisinopril  40 mg Oral Daily  . sodium chloride  3 mL Intravenous Q12H   Continuous Infusions: . sodium chloride 1,000 mL (10/24/12 2302)    Principal Problem:   Hypertension, accelerated Active Problems:   Headache(784.0)    Time spent:    Keonta Alsip K  [facility] Pager [phone]. If 7PM-7AM, please contact night-coverage at [email or web address], password [email or web address] 10/25/2012, 11:53 AM  LOS: 1 day

## 2012-10-25 NOTE — Progress Notes (Signed)
Patient's BP in the 160s, can not give his scheduled beta blockers due to low pulse. Gave his other scheduled BP meds plus IV hydralazine. Pt also informed me he has a hx of seizures and his last one was "about a week ago."

## 2012-10-25 NOTE — H&P (Signed)
[facility] History and Physical  Luis Oconnor NWG:[id number] DOB: [birth year] DOA: 10/24/2012  Referring physician: ER physician. PCP: Willey Blade, MD  Chief Complaint: Headache and high blood pressure.  HPI: Luis Oconnor is a 57 y.o. male with known history of cerebral hemorrhage this March requiring surgery with history of hypertension presented to the ER because of headache over the last 2 days. Patient also check his blood pressure which was high. In the ER he was found to have elevated blood pressure with systolic more than 200. Patient's headache was mostly in the frontal area with no focal deficits. Denies any associated nausea vomiting visual symptoms. CT head was negative for anything acute. Patient was initially placed on Cardene drip and patient's blood pressure improved with patient's headache any better with fentanyl. At this time patient feels better and be admitted for further management. Patient denies any chest pain or shortness of breath nausea vomiting abdominal pain diarrhea fever chills or any focal deficits. Patient states that he has been taking his medications and has not missed them.  Review of Systems: As presented in the history of presenting illness, rest negative.  Past Medical History  Diagnosis Date  . Hypertension   . Ulcer   . Brain aneurysm   . Epistaxis, recurrent    Past Surgical History  Procedure Laterality Date  . Craniotomy Right 06/14/2012    Procedure: CRANIOTOMY HEMATOMA EVACUATION SUBDURAL;  Surgeon: [name], MD;  Location: [facility];  Service: Neurosurgery;  Laterality: Right;   Social History:  reports that he has never smoked. He does not have any smokeless tobacco history on file. He reports that he does not drink alcohol or use illicit drugs. Home. where does patient live-- Can do ADLs. Can patient participate in ADLs?  Allergies  Allergen Reactions  . Clonidine Derivatives Nausea Only    Family History  Problem  Relation Age of Onset  . Hypertension Mother   . Hypertension Father   . Cancer Brother     leukemia  . Renal Disease Brother       Prior to Admission medications   Medication Sig Start Date End Date Taking? Authorizing Provider  amLODipine (NORVASC) 10 MG tablet Take 1 tablet (10 mg total) by mouth daily. 07/15/12  Yes Toy Baker, MD  atenolol (TENORMIN) 50 MG tablet Take 50 mg by mouth daily.   Yes Historical Provider, MD  carbamazepine (TEGRETOL XR) 200 MG 12 hr tablet Take 1 tablet (200 mg total) by mouth 2 (two) times daily. 07/03/12  Yes Evlyn Kanner Love, PA-C  labetalol (NORMODYNE) 100 MG tablet Take 100 mg by mouth 3 (three) times daily.   Yes Historical Provider, MD  lisinopril (PRINIVIL,ZESTRIL) 40 MG tablet Take 40 mg by mouth daily.   Yes Historical Provider, MD  Multiple Vitamins-Minerals (MULTIVITAMIN) tablet Take 1 tablet by mouth daily. 07/03/12  Yes Jacquelynn Cree, PA-C   Physical Exam: Filed Vitals:   10/24/12 2145 10/24/12 2210 10/24/12 2308 10/25/12 0200  BP: 149/96 152/96 170/92 160/99  Pulse: 79 68 70 61  Temp:  97.9 F (36.6 C)  98.1 F (36.7 C)  TempSrc:      Resp:  18  18  Height:   5\' 6"  (1.676 m)   Weight:   69.4 kg (153 lb)   SpO2: 100% 98%  98%     General:  Well-developed and nourished.  Eyes: Anicteric no pallor.  ENT: No discharge from ears eyes nose mouth.  Neck: No mass felt. No neck rigidity.  Cardiovascular: S1-S2 heard.  Respiratory: No rhonchi or crepitations.  Abdomen: Soft nontender bowel sounds present.  Skin: No rash.  Musculoskeletal: No edema.  Psychiatric: Appears normal.  Neurologic: Alert awake oriented to time place and person. Moves all extremities 5 x 5. No facial asymmetry or tongue deviation.  Labs on Admission:  Basic Metabolic Panel:  Recent Labs Lab 10/24/12 1849  NA 136  K 4.0  CL 100  CO2 29  GLUCOSE 110*  BUN 14  CREATININE 1.00  CALCIUM 9.8   Liver Function Tests: No results found for this  basename: AST, ALT, ALKPHOS, BILITOT, PROT, ALBUMIN,  in the last 168 hours No results found for this basename: LIPASE, AMYLASE,  in the last 168 hours No results found for this basename: AMMONIA,  in the last 168 hours CBC:  Recent Labs Lab 10/24/12 1849  WBC 9.8  NEUTROABS 8.2*  HGB 15.4  HCT 42.3  MCV 83.9  PLT 200   Cardiac Enzymes:  Recent Labs Lab 10/24/12 1917  TROPONINI <0.30    BNP (last 3 results) No results found for this basename: PROBNP,  in the last 8760 hours CBG: No results found for this basename: GLUCAP,  in the last 168 hours  Radiological Exams on Admission: Dg Chest 2 View  10/24/2012   *RADIOLOGY REPORT*  Clinical Data: Shortness of breath  CHEST - 2 VIEW  Comparison: 06/14/2012  Findings: Lungs are clear. No pleural effusion or pneumothorax.  Apparent opacity at the left lung base reflects prominent epicardial fat.  Mild cardiomegaly.  Mild degenerative changes of the visualized thoracolumbar spine.  IMPRESSION: No evidence of acute cardiopulmonary disease.   Original Report Authenticated By: Charline Bills, M.D.   Ct Head Wo Contrast  10/24/2012   *RADIOLOGY REPORT*  Clinical Data: Severe headache and dizziness.  Previous craniotomy.  CT HEAD WITHOUT CONTRAST  Technique:  Contiguous axial images were obtained from the base of the skull through the vertex without contrast.  Comparison: 09/05/2012 and multiple previous  Findings: There has been previous right parietal craniotomy.  There is atrophy of the underlying brain with encephalomalacia and adjacent gliosis.  Elsewhere, the brain shows some chronic small vessel changes in the deep white matter and basal ganglia.  No sign of acute infarction, mass lesion, hemorrhage, hydrocephalus or extra-axial collection.  Sinuses are clear.  IMPRESSION: No acute finding.  Previous right parietal craniotomy.  Atrophy and gliosis in the right temporal and parietal region at the site of previous hemorrhage.   Original  Report Authenticated By: Paulina Fusi, M.D.     Assessment/Plan Principal Problem:   Hypertension, accelerated Active Problems:   Headache(784.0)   1. Accelerated hypertension - presently patient is off Cardene drip. Patient will be admitted to telemetry with when necessary IV labetalol and hydralazine and continue his home medication and closely observe blood pressure trends. 2. Headache - probably related to blood pressure. Headache improved with IV fentanyl. Closely observe with neurochecks. 3. 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    Code Status: Full code.  Family Communication: None.  Disposition Plan: Admit.    KAKRAKANDY,ARSHAD N. [facility] Pager [phone].  If 7PM-7AM, please contact night-coverage [email or web address] Password [facility] 10/25/2012, 3:47 AM

## 2012-10-26 ENCOUNTER — Telehealth: Payer: Self-pay | Admitting: Physical Medicine & Rehabilitation

## 2012-10-26 MED ORDER — HYDRALAZINE HCL 25 MG PO TABS
25.0000 mg | ORAL_TABLET | Freq: Three times a day (TID) | ORAL | Status: DC
Start: 2012-10-26 — End: 2012-10-26
  Administered 2012-10-26: 25 mg via ORAL
  Filled 2012-10-26 (×3): qty 1

## 2012-10-26 MED ORDER — HYDRALAZINE HCL 25 MG PO TABS
25.0000 mg | ORAL_TABLET | Freq: Once | ORAL | Status: AC
  Administered 2012-10-26: 25 mg via ORAL
  Filled 2012-10-26: qty 1

## 2012-10-26 MED ORDER — ATENOLOL 50 MG PO TABS: 50.0000 mg | ORAL_TABLET | Freq: Every day | ORAL | Status: DC

## 2012-10-26 MED ORDER — HYDRALAZINE HCL 10 MG PO TABS: 10.0000 mg | ORAL_TABLET | Freq: Three times a day (TID) | ORAL | Status: DC

## 2012-10-26 MED ORDER — HYDRALAZINE HCL 50 MG PO TABS
50.0000 mg | ORAL_TABLET | Freq: Three times a day (TID) | ORAL | Status: DC
  Administered 2012-10-26 – 2012-10-27 (×3): 50 mg via ORAL
  Filled 2012-10-26 (×5): qty 1

## 2012-10-26 NOTE — Discharge Summary (Addendum)
Physician Discharge Summary  [name]:[id number] DOB: [birth year] DOA: 10/24/2012  PCP: Willey Blade, MD  Admit date: 10/24/2012 Discharge date: 10/27/12  Time spent: 30 minutes  Recommendations for Outpatient Follow-up:  1. Follow up with PCP in 1-2 weeks  Discharge Diagnoses:  Principal Problem:   Hypertension, accelerated Active Problems:   Headache(784.0)   Discharge Condition: Improved  Diet recommendation: Low sodium  Filed Weights   10/24/12 2308  Weight: 69.4 kg (153 lb)    History of present illness:  Luis Oconnor is a 57 y.o. male with known history of cerebral hemorrhage this March requiring surgery with history of hypertension presented to the ER because of headache over the last 2 days. Patient also check his blood pressure which was high. In the ER he was found to have elevated blood pressure with systolic more than 200. Patient's headache was mostly in the frontal area with no focal deficits. Denies any associated nausea vomiting visual symptoms. CT head was negative for anything acute. Patient was initially placed on Cardene drip and patient's blood pressure improved with patient's headache any better with fentanyl. At this time patient feels better and be admitted for further management. Patient denies any chest pain or shortness of breath nausea vomiting abdominal pain diarrhea fever chills or any focal deficits. Patient states that he has been taking his medications and has not missed them.  Hospital Course:  The patient was continued on his home bp meds and still required PRN hydralazine and PRN lopressor IV. On further questioning, the patient claimed to be compliant with meds with usual home sbp into the 180's. Oral schedule hydralazine was started in addition to his home bp meds. The patient's blood pressures improved, albeit not at ideal goal. The patient otherwise remained medically stable for discharge for close outpatient follow up.  Discharge  Exam: Filed Vitals:   10/27/12 0701 10/27/12 1003 10/27/12 1025 10/27/12 1130  BP: 136/86 166/110 166/106 148/95  Pulse: 70 66 65 65  Temp: 98.2 F (36.8 C)  98.1 F (36.7 C)   TempSrc: Oral  Oral   Resp: 18  18   Height:      Weight:      SpO2: 99%  100%     General: Awake, in nad Cardiovascular: regular, s1, s2 Respiratory: normal resp effort  Discharge Instructions     Medication List    STOP taking these medications       labetalol 100 MG tablet  Commonly known as:  NORMODYNE      TAKE these medications       amLODipine 10 MG tablet  Commonly known as:  NORVASC  Take 1 tablet (10 mg total) by mouth daily.     atenolol 50 MG tablet  Commonly known as:  TENORMIN  Take 1 tablet (50 mg total) by mouth daily.     carbamazepine 200 MG 12 hr tablet  Commonly known as:  TEGRETOL XR  Take 1 tablet (200 mg total) by mouth 2 (two) times daily.     hydrALAZINE 50 MG tablet  Commonly known as:  APRESOLINE  Take 1 tablet (50 mg total) by mouth every 8 (eight) hours.     lisinopril 40 MG tablet  Commonly known as:  PRINIVIL,ZESTRIL  Take 40 mg by mouth daily.     multivitamin tablet  Take 1 tablet by mouth daily.       Allergies  Allergen Reactions  . Clonidine Derivatives Nausea Only   Follow-up Information  Follow up with August Saucer, ERIC, MD. Schedule an appointment as soon as possible for a visit in 1 week.   Contact information:   [facility] [address] [place] [address] [phone]        The results of significant diagnostics from this hospitalization (including imaging, microbiology, ancillary and laboratory) are listed below for reference.    Significant Diagnostic Studies: Dg Chest 2 View  10/24/2012   *RADIOLOGY REPORT*  Clinical Data: Shortness of breath  CHEST - 2 VIEW  Comparison: 06/14/2012  Findings: Lungs are clear. No pleural effusion or pneumothorax.  Apparent opacity at the left lung base reflects  prominent epicardial fat.  Mild cardiomegaly.  Mild degenerative changes of the visualized thoracolumbar spine.  IMPRESSION: No evidence of acute cardiopulmonary disease.   Original Report Authenticated By: Charline Bills, M.D.   Ct Head Wo Contrast  10/24/2012   *RADIOLOGY REPORT*  Clinical Data: Severe headache and dizziness.  Previous craniotomy.  CT HEAD WITHOUT CONTRAST  Technique:  Contiguous axial images were obtained from the base of the skull through the vertex without contrast.  Comparison: 09/05/2012 and multiple previous  Findings: There has been previous right parietal craniotomy.  There is atrophy of the underlying brain with encephalomalacia and adjacent gliosis.  Elsewhere, the brain shows some chronic small vessel changes in the deep white matter and basal ganglia.  No sign of acute infarction, mass lesion, hemorrhage, hydrocephalus or extra-axial collection.  Sinuses are clear.  IMPRESSION: No acute finding.  Previous right parietal craniotomy.  Atrophy and gliosis in the right temporal and parietal region at the site of previous hemorrhage.   Original Report Authenticated By: Paulina Fusi, M.D.    Microbiology: No results found for this or any previous visit (from the past 240 hour(s)).   Labs: Basic Metabolic Panel:  Recent Labs Lab 10/24/12 1849 10/25/12 0603  NA 136 136  K 4.0 3.4*  CL 100 102  CO2 29 25  GLUCOSE 110* 91  BUN 14 12  CREATININE 1.00 0.94  CALCIUM 9.8 9.1   Liver Function Tests:  Recent Labs Lab 10/25/12 0603  AST 14  ALT 13  ALKPHOS 73  BILITOT 0.3  PROT 6.2  ALBUMIN 3.3*   No results found for this basename: LIPASE, AMYLASE,  in the last 168 hours No results found for this basename: AMMONIA,  in the last 168 hours CBC:  Recent Labs Lab 10/24/12 1849 10/25/12 0603  WBC 9.8 6.2  NEUTROABS 8.2*  --   HGB 15.4 14.4  HCT 42.3 39.2  MCV 83.9 83.2  PLT 200 193   Cardiac Enzymes:  Recent Labs Lab 10/24/12 1917  TROPONINI <0.30    BNP: BNP (last 3 results) No results found for this basename: PROBNP,  in the last 8760 hours CBG: No results found for this basename: GLUCAP,  in the last 168 hours     Signed:  Sherian Valenza K  [facility] 10/27/2012, 12:05 PM

## 2012-10-26 NOTE — Progress Notes (Signed)
[facility] PROGRESS NOTE  Luis Oconnor ZOX:[id number] DOB: 12/27/1955 DOA: 10/24/2012 PCP: August Saucer ERIC, MD  Assessment/Plan: Accelerated hypertension  - presently patient is off Cardene drip.  - Cont with PRN IV labetalol and hydralazine  - Continue his home medication witih hydralazine q8hrs - Pt's blood pressure initially noted to be much better controlled overnight -  Pt originally planned for d/c today, however, a repeat BP was noted to have sbp over 180's Asymptomatic. Headache  - likely related to blood pressure.  - Resolved History of intracerebral bleed requiring surgery this March.  Code Status: Full Family Communication: Pt in room, pt's girlfriend (indicate person spoken with, relationship, and if by phone, the number) Disposition Plan: Pending home possible 10/27/12   HPI/Subjective: No complaints  Objective: Filed Vitals:   10/26/12 0503 10/26/12 0928 10/26/12 0930 10/26/12 1115  BP: 154/97 180/102  181/98  Pulse: 51 58 59 52  Temp: 97.8 F (36.6 C) 98.2 F (36.8 C)    TempSrc: Oral Oral    Resp: 16 18    Height:      Weight:      SpO2: 98% 100%      Intake/Output Summary (Last 24 hours) at 10/26/12 1129 Last data filed at 10/26/12 1030  Gross per 24 hour  Intake    660 ml  Output   1025 ml  Net   -365 ml   Filed Weights   10/24/12 2308  Weight: 69.4 kg (153 lb)    Exam:   General:  Awake, in nad  Cardiovascular: regular, s1, s2  Respiratory: normal resp effort, no wheezing  Abdomen: soft, nondistended  Musculoskeletal: perfused, no clubbing   Data Reviewed: Basic Metabolic Panel:  Recent Labs Lab 10/24/12 1849 10/25/12 0603  NA 136 136  K 4.0 3.4*  CL 100 102  CO2 29 25  GLUCOSE 110* 91  BUN 14 12  CREATININE 1.00 0.94  CALCIUM 9.8 9.1   Liver Function Tests:  Recent Labs Lab 10/25/12 0603  AST 14  ALT 13  ALKPHOS 73  BILITOT 0.3  PROT 6.2  ALBUMIN 3.3*   No results found for this basename: LIPASE,  AMYLASE,  in the last 168 hours No results found for this basename: AMMONIA,  in the last 168 hours CBC:  Recent Labs Lab 10/24/12 1849 10/25/12 0603  WBC 9.8 6.2  NEUTROABS 8.2*  --   HGB 15.4 14.4  HCT 42.3 39.2  MCV 83.9 83.2  PLT 200 193   Cardiac Enzymes:  Recent Labs Lab 10/24/12 1917  TROPONINI <0.30   BNP (last 3 results) No results found for this basename: PROBNP,  in the last 8760 hours CBG: No results found for this basename: GLUCAP,  in the last 168 hours  No results found for this or any previous visit (from the past 240 hour(s)).   Studies: Dg Chest 2 View  10/24/2012   *RADIOLOGY REPORT*  Clinical Data: Shortness of breath  CHEST - 2 VIEW  Comparison: 06/14/2012  Findings: Lungs are clear. No pleural effusion or pneumothorax.  Apparent opacity at the left lung base reflects prominent epicardial fat.  Mild cardiomegaly.  Mild degenerative changes of the visualized thoracolumbar spine.  IMPRESSION: No evidence of acute cardiopulmonary disease.   Original Report Authenticated By: Charline Bills, M.D.   Ct Head Wo Contrast  10/24/2012   *RADIOLOGY REPORT*  Clinical Data: Severe headache and dizziness.  Previous craniotomy.  CT HEAD WITHOUT CONTRAST  Technique:  Contiguous axial images were  obtained from the base of the skull through the vertex without contrast.  Comparison: 09/05/2012 and multiple previous  Findings: There has been previous right parietal craniotomy.  There is atrophy of the underlying brain with encephalomalacia and adjacent gliosis.  Elsewhere, the brain shows some chronic small vessel changes in the deep white matter and basal ganglia.  No sign of acute infarction, mass lesion, hemorrhage, hydrocephalus or extra-axial collection.  Sinuses are clear.  IMPRESSION: No acute finding.  Previous right parietal craniotomy.  Atrophy and gliosis in the right temporal and parietal region at the site of previous hemorrhage.   Original Report Authenticated By:  Paulina Fusi, M.D.    Scheduled Meds: . amLODipine  10 mg Oral Daily  . atenolol  50 mg Oral Daily  . carbamazepine  200 mg Oral BID  . hydrALAZINE  10 mg Oral Q8H  . labetalol  100 mg Oral TID  . lisinopril  40 mg Oral Daily  . sodium chloride  3 mL Intravenous Q12H   Continuous Infusions: . sodium chloride 1,000 mL (10/24/12 2302)    Principal Problem:   Hypertension, accelerated Active Problems:   Headache(784.0)    Time spent:    CHIU, STEPHEN K  [facility] Pager [phone]. If 7PM-7AM, please contact night-coverage at [email or web address], password [facility] 10/26/2012, 11:29 AM  LOS: 2 days

## 2012-10-26 NOTE — ED Provider Notes (Signed)
Medical screening examination/treatment/procedure(s) were conducted as a shared visit with non-physician practitioner(s) and myself.  I personally evaluated the patient during the encounter.  Hypertensive crisis requiring iv cardene.  Admit to critical care.  CRITICAL CARE Performed by: Donnetta Hutching Total critical care time: 30 Critical care time was exclusive of separately billable procedures and treating other patients. Critical care was necessary to treat or prevent imminent or life-threatening deterioration. Critical care was time spent personally by me on the following activities: development of treatment plan with patient and/or surrogate as well as nursing, discussions with consultants, evaluation of patient's response to treatment, examination of patient, obtaining history from patient or surrogate, ordering and performing treatments and interventions, ordering and review of laboratory studies, ordering and review of radiographic studies, pulse oximetry and re-evaluation of patient's condition.  Donnetta Hutching, MD 10/26/12 [phone]

## 2012-10-26 NOTE — Progress Notes (Signed)
Patient's blood pressure is 181/98, HR 52 1 hr 45 minutes after patient received morning blood pressure medications.  Dr. Rhona Leavens notified and advises to hold PRN blood pressure medications so he can try to regulate the patient's blood pressure on PO medication in preparation for discharge.  RN advised to call MD for systolic >190.  Will continue to monitor.

## 2012-10-26 NOTE — Telephone Encounter (Signed)
Just FYI:   PT's wife called in stating, her husband is in the hospital... She states the Dr. Leta Speller Dr. Riley Kill can look into EPIC to see what is going on with the Pt... Per wife his blood pressure is high 217/117 and they are trying to see which medication will work well with pt and decrease pressure.Luis KitchenMarland Oconnor

## 2012-10-26 NOTE — Progress Notes (Signed)
UR COMPLETED  

## 2012-10-27 MED ORDER — HYDRALAZINE HCL 50 MG PO TABS: 50.0000 mg | ORAL_TABLET | Freq: Three times a day (TID) | ORAL | Status: DC

## 2012-10-27 NOTE — Progress Notes (Signed)
Patient and significant other given discharge instructions and prescriptions.  All questions answered.  Patient discharged home with significant other.  Patient escorted via wheelchair to vehicle.  Will continue to monitor.

## 2012-10-28 ENCOUNTER — Encounter: Payer: Medicaid Other | Admitting: Physical Medicine & Rehabilitation

## 2012-12-18 ENCOUNTER — Encounter (HOSPITAL_COMMUNITY): Payer: Self-pay | Admitting: Emergency Medicine

## 2012-12-18 ENCOUNTER — Inpatient Hospital Stay (HOSPITAL_COMMUNITY)
Admission: EM | Admit: 2012-12-18 | Discharge: 2012-12-21 | DRG: 305 | Disposition: A | Discharge status: Home / Self Care | Payer: Medicaid Other | Attending: Internal Medicine | Admitting: Internal Medicine

## 2012-12-18 DIAGNOSIS — I498 Other specified cardiac arrhythmias: Secondary | ICD-10-CM | POA: Diagnosis present

## 2012-12-18 DIAGNOSIS — Z806 Family history of leukemia: Secondary | ICD-10-CM

## 2012-12-18 DIAGNOSIS — Z91199 Patient's noncompliance with other medical treatment and regimen due to unspecified reason: Secondary | ICD-10-CM

## 2012-12-18 DIAGNOSIS — E871 Hypo-osmolality and hyponatremia: Secondary | ICD-10-CM

## 2012-12-18 DIAGNOSIS — Z8249 Family history of ischemic heart disease and other diseases of the circulatory system: Secondary | ICD-10-CM

## 2012-12-18 DIAGNOSIS — H539 Unspecified visual disturbance: Secondary | ICD-10-CM | POA: Diagnosis present

## 2012-12-18 DIAGNOSIS — I619 Nontraumatic intracerebral hemorrhage, unspecified: Secondary | ICD-10-CM

## 2012-12-18 DIAGNOSIS — Z9119 Patient's noncompliance with other medical treatment and regimen: Secondary | ICD-10-CM

## 2012-12-18 DIAGNOSIS — R51 Headache: Secondary | ICD-10-CM | POA: Diagnosis present

## 2012-12-18 DIAGNOSIS — Z8673 Personal history of transient ischemic attack (TIA), and cerebral infarction without residual deficits: Secondary | ICD-10-CM

## 2012-12-18 DIAGNOSIS — R519 Headache, unspecified: Secondary | ICD-10-CM | POA: Diagnosis present

## 2012-12-18 DIAGNOSIS — R1110 Vomiting, unspecified: Secondary | ICD-10-CM

## 2012-12-18 DIAGNOSIS — I10 Essential (primary) hypertension: Principal | ICD-10-CM | POA: Diagnosis present

## 2012-12-18 DIAGNOSIS — R509 Fever, unspecified: Secondary | ICD-10-CM

## 2012-12-18 DIAGNOSIS — Z841 Family history of disorders of kidney and ureter: Secondary | ICD-10-CM

## 2012-12-18 MED ORDER — NITROGLYCERIN IN D5W 200-5 MCG/ML-% IV SOLN
5.0000 ug/min | INTRAVENOUS | Status: DC
  Filled 2012-12-18: qty 250

## 2012-12-18 NOTE — ED Notes (Signed)
Pt. reports elevated blood pressurse at home ( 190's systolic) for the past several days with slight dizziness and " seeing spots".

## 2012-12-19 ENCOUNTER — Emergency Department (HOSPITAL_COMMUNITY): Payer: Medicaid Other

## 2012-12-19 DIAGNOSIS — R51 Headache: Secondary | ICD-10-CM

## 2012-12-19 DIAGNOSIS — I10 Essential (primary) hypertension: Principal | ICD-10-CM

## 2012-12-19 LAB — CBC
HCT: 48 % (ref 39.0–52.0)
MCH: 30.1 pg (ref 26.0–34.0)
MCHC: 36.5 g/dL — ABNORMAL HIGH (ref 30.0–36.0)
MCV: 82.2 fL (ref 78.0–100.0)
MCV: 82.2 fL (ref 78.0–100.0)
Platelets: 221 10*3/uL (ref 150–400)
Platelets: 237 10*3/uL (ref 150–400)
RBC: 5.84 MIL/uL — ABNORMAL HIGH (ref 4.22–5.81)
RDW: 12.2 % (ref 11.5–15.5)
WBC: 6.1 10*3/uL (ref 4.0–10.5)
WBC: 6.9 10*3/uL (ref 4.0–10.5)

## 2012-12-19 LAB — POCT I-STAT, CHEM 8
BUN: 19 mg/dL (ref 6–23)
Calcium, Ion: 1.13 mmol/L (ref 1.12–1.23)
Chloride: 99 mEq/L (ref 96–112)
Creatinine, Ser: 1.1 mg/dL (ref 0.50–1.35)
Glucose, Bld: 100 mg/dL — ABNORMAL HIGH (ref 70–99)
HCT: 49 % (ref 39.0–52.0)
Hemoglobin: 16.7 g/dL (ref 13.0–17.0)
TCO2: 25 mmol/L (ref 0–100)

## 2012-12-19 LAB — MRSA PCR SCREENING: MRSA by PCR: NEGATIVE

## 2012-12-19 LAB — BASIC METABOLIC PANEL
BUN: 17 mg/dL (ref 6–23)
CO2: 25 mEq/L (ref 19–32)
Chloride: 95 mEq/L — ABNORMAL LOW (ref 96–112)
Creatinine, Ser: 0.94 mg/dL (ref 0.50–1.35)
GFR calc Af Amer: >90 mL/min (ref >90)
GFR calc non Af Amer: 90 mL/min — ABNORMAL LOW (ref >90)
Potassium: 4.1 mEq/L (ref 3.5–5.1)
Sodium: 129 mEq/L — ABNORMAL LOW (ref 135–145)

## 2012-12-19 MED ORDER — DIPHENHYDRAMINE-APAP (SLEEP) 25-500 MG PO TABS: 0.5000 | ORAL_TABLET | Freq: Every evening | ORAL | Status: DC | PRN

## 2012-12-19 MED ORDER — HEPARIN SODIUM (PORCINE) 5000 UNIT/ML IJ SOLN
5000.0000 [IU] | Freq: Three times a day (TID) | INTRAMUSCULAR | Status: DC
  Administered 2012-12-19 – 2012-12-21 (×7): 5000 [IU] via SUBCUTANEOUS
  Filled 2012-12-19 (×10): qty 1

## 2012-12-19 MED ORDER — LISINOPRIL 40 MG PO TABS
80.0000 mg | ORAL_TABLET | Freq: Every morning | ORAL | Status: DC
  Filled 2012-12-19: qty 2

## 2012-12-19 MED ORDER — THERA VITAL M PO TABS: 1.0000 | ORAL_TABLET | Freq: Every day | ORAL | Status: DC

## 2012-12-19 MED ORDER — SODIUM CHLORIDE 0.9 % IJ SOLN
3.0000 mL | Freq: Two times a day (BID) | INTRAMUSCULAR | Status: DC
  Administered 2012-12-19: 3 mL via INTRAVENOUS
  Administered 2012-12-19: 10:00:00 via INTRAVENOUS
  Administered 2012-12-20: 3 mL via INTRAVENOUS
  Administered 2012-12-20: 12:00:00 via INTRAVENOUS
  Administered 2012-12-21: 3 mL via INTRAVENOUS

## 2012-12-19 MED ORDER — CARBAMAZEPINE 200 MG PO TABS: 200.0000 mg | ORAL_TABLET | Freq: Once | ORAL | Status: DC

## 2012-12-19 MED ORDER — HYDROCHLOROTHIAZIDE 12.5 MG PO CAPS
12.5000 mg | ORAL_CAPSULE | Freq: Every day | ORAL | Status: DC
  Administered 2012-12-19: 12.5 mg via ORAL
  Filled 2012-12-19 (×2): qty 1

## 2012-12-19 MED ORDER — LISINOPRIL 40 MG PO TABS
40.0000 mg | ORAL_TABLET | Freq: Every morning | ORAL | Status: DC
  Administered 2012-12-19 – 2012-12-21 (×3): 40 mg via ORAL
  Filled 2012-12-19 (×3): qty 1

## 2012-12-19 MED ORDER — AMLODIPINE BESYLATE 10 MG PO TABS
10.0000 mg | ORAL_TABLET | Freq: Every day | ORAL | Status: DC
  Administered 2012-12-19 – 2012-12-21 (×3): 10 mg via ORAL
  Filled 2012-12-19 (×3): qty 1

## 2012-12-19 MED ORDER — CARBAMAZEPINE ER 200 MG PO TB12
200.0000 mg | ORAL_TABLET | Freq: Two times a day (BID) | ORAL | Status: DC
  Administered 2012-12-19 – 2012-12-21 (×6): 200 mg via ORAL
  Filled 2012-12-19 (×7): qty 1

## 2012-12-19 MED ORDER — CARBAMAZEPINE ER 200 MG PO TB12
200.0000 mg | ORAL_TABLET | Freq: Once | ORAL | Status: AC
  Administered 2012-12-19: 200 mg via ORAL
  Filled 2012-12-19: qty 1

## 2012-12-19 MED ORDER — ATENOLOL 50 MG PO TABS
50.0000 mg | ORAL_TABLET | Freq: Every day | ORAL | Status: DC
  Administered 2012-12-19: 50 mg via ORAL
  Filled 2012-12-19: qty 1

## 2012-12-19 MED ORDER — ATENOLOL 50 MG PO TABS
50.0000 mg | ORAL_TABLET | Freq: Every day | ORAL | Status: DC
  Filled 2012-12-19: qty 1

## 2012-12-19 MED ORDER — HYDRALAZINE HCL 25 MG PO TABS
25.0000 mg | ORAL_TABLET | Freq: Four times a day (QID) | ORAL | Status: DC
  Filled 2012-12-19 (×4): qty 1

## 2012-12-19 MED ORDER — HYDROCHLOROTHIAZIDE 25 MG PO TABS
12.5000 mg | ORAL_TABLET | Freq: Every day | ORAL | Status: DC
  Filled 2012-12-19: qty 0.5

## 2012-12-19 MED ORDER — DIPHENHYDRAMINE HCL 12.5 MG/5ML PO ELIX
12.5000 mg | ORAL_SOLUTION | Freq: Every evening | ORAL | Status: DC | PRN
  Administered 2012-12-19: 12.5 mg via ORAL
  Filled 2012-12-19: qty 5

## 2012-12-19 MED ORDER — ACETAMINOPHEN 500 MG PO TABS
250.0000 mg | ORAL_TABLET | Freq: Every evening | ORAL | Status: DC | PRN
  Administered 2012-12-19 – 2012-12-20 (×3): 250 mg via ORAL
  Filled 2012-12-19 (×3): qty 1

## 2012-12-19 MED ORDER — ADULT MULTIVITAMIN W/MINERALS CH
1.0000 | ORAL_TABLET | Freq: Every day | ORAL | Status: DC
  Administered 2012-12-19 – 2012-12-21 (×3): 1 via ORAL
  Filled 2012-12-19 (×3): qty 1

## 2012-12-19 NOTE — H&P (Signed)
[facility] History and Physical  Luis Oconnor ZOX:[id number] DOB: November 29, 1955 DOA: 12/18/2012  Referring physician: ED PCP: Willey Blade, MD    Chief Complaint: Headache, dizziness  HPI: Luis Oconnor is a 57 y.o. male who presents with headache and dizziness.  He states he is also having visual disturbance including "seeing spots" at times.  He took his BP at home (as he has had very high readings with these symptoms in the past) and sure enough, he noted his SBPs to be in the 190s or even over 200.  He presented to the ED.  In the ED his BPs continued to run high, however he was bradycardic into the 40s, so he was placed on a nitro GTT and hospitalist asked to admit for hypertensive urgency.  Review of Systems: 12 systems reviewed and otherwise negative.  Past Medical History  Diagnosis Date  . Hypertension   . Ulcer   . Brain aneurysm   . Epistaxis, recurrent    Past Surgical History  Procedure Laterality Date  . Craniotomy Right 06/14/2012    Procedure: CRANIOTOMY HEMATOMA EVACUATION SUBDURAL;  Surgeon: [name], MD;  Location: [facility];  Service: Neurosurgery;  Laterality: Right;   Social History:  reports that he has never smoked. He does not have any smokeless tobacco history on file. He reports that he does not drink alcohol or use illicit drugs.   Allergies  Allergen Reactions  . Clonidine Derivatives Nausea Only    Family History  Problem Relation Age of Onset  . Hypertension Mother   . Hypertension Father   . Cancer Brother     leukemia  . Renal Disease Brother     Prior to Admission medications   Medication Sig Start Date End Date Taking? Authorizing Provider  acetaminophen (TYLENOL) 500 MG tablet Take 500 mg by mouth every 6 (six) hours as needed for pain.   Yes Historical Provider, MD  amLODipine (NORVASC) 10 MG tablet Take 1 tablet (10 mg total) by mouth daily. 07/15/12  Yes Toy Baker, MD  atenolol (TENORMIN) 50 MG tablet Take 1  tablet (50 mg total) by mouth daily. 10/26/12  Yes Jerald Kief, MD  carbamazepine (TEGRETOL XR) 200 MG 12 hr tablet Take 1 tablet (200 mg total) by mouth 2 (two) times daily. 07/03/12  Yes Evlyn Kanner Love, PA-C  diphenhydramine-acetaminophen (TYLENOL PM) 25-500 MG TABS Take 0.5 tablets by mouth at bedtime as needed (sleep/pain).   Yes Historical Provider, MD  hydrochlorothiazide (HYDRODIURIL) 25 MG tablet Take 12.5 mg by mouth daily.   Yes Historical Provider, MD  lisinopril (PRINIVIL,ZESTRIL) 40 MG tablet Take 80 mg by mouth every morning.    Yes Historical Provider, MD  Multiple Vitamins-Minerals (MULTIVITAMIN) tablet Take 1 tablet by mouth daily. 07/03/12  Yes Jacquelynn Cree, PA-C   Physical Exam: Filed Vitals:   12/19/12 0216  BP: 197/114  Pulse: 49  Temp:   Resp: 18    General:  NAD, resting comfortably in bed Eyes: PEERLA EOMI ENT: mucous membranes moist Neck: supple w/o JVD Cardiovascular: RRR w/o MRG Respiratory: CTA B Abdomen: soft, nt, nd, bs+ Skin: no rash nor lesion Musculoskeletal: MAE, full ROM all 4 extremities Psychiatric: normal tone and affect Neurologic: AAOx3, grossly non-focal  Labs on Admission:  Basic Metabolic Panel:  Recent Labs Lab 12/19/12 0002  NA 133*  K 3.9  CL 99  GLUCOSE 100*  BUN 19  CREATININE 1.10   Liver Function Tests: No results found  for this basename: AST, ALT, ALKPHOS, BILITOT, PROT, ALBUMIN,  in the last 168 hours No results found for this basename: LIPASE, AMYLASE,  in the last 168 hours No results found for this basename: AMMONIA,  in the last 168 hours CBC:  Recent Labs Lab 12/18/12 0050 12/19/12 0002  WBC 6.9  --   HGB 17.6* 16.7  HCT 48.0 49.0  MCV 82.2  --   PLT 221  --    Cardiac Enzymes: No results found for this basename: CKTOTAL, CKMB, CKMBINDEX, TROPONINI,  in the last 168 hours  BNP (last 3 results) No results found for this basename: PROBNP,  in the last 8760 hours CBG: No results found for this basename:  GLUCAP,  in the last 168 hours  Radiological Exams on Admission: Ct Head Wo Contrast  12/19/2012   *RADIOLOGY REPORT*  Clinical Data: Hypertension  CT HEAD WITHOUT CONTRAST  Technique:  Contiguous axial images were obtained from the base of the skull through the vertex without contrast.  Comparison: Prior CT from 10/24/2012  Findings: Postoperative changes from prior right frontoparietal craniotomy are again seen.  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  There is no acute intracranial hemorrhage or infarct.  Mild chronic microvascular ischemic disease is stable.  No midline shift or mass lesion.  No hydrocephalous.  No extra-axial fluid collection.  The orbital soft tissues are normal.  Paranasal sinuses and mastoid air cells are clear.  IMPRESSION: 1.  No acute intracranial process identified. 2.  Stable right frontoparietal craniotomy with underlying encephalomalacia and gliosis at the site of previous hemorrhage.   Original Report Authenticated By: Rise Mu, M.D.    EKG: Independently reviewed.  Assessment/Plan Principal Problem:   Hypertension, accelerated Active Problems:   HTN (hypertension)   Headache(784.0)   1. HTN, accelerated - Patient on nitro gtt, continuing home meds, after he gets these will see if NTG GTT can be weaned and he can be switched to PRN hydralazine.  BP control obtained and BP down to 154/112 on the NTG drip at this time.  Patients symptoms have resolved when sitting down at this time but his BP when he tried to stand up went back up to the 200s systolic. 2. Headache - due to BP    Code Status: Full Code (must indicate code status--if unknown or must be presumed, indicate so) Family Communication: no family in room (indicate person spoken with, if applicable, with phone number if by telephone) Disposition Plan: Admit to inpatient (indicate anticipated LOS)  Time spent: 70 min  Shatira Dobosz M. [facility] Pager [phone]  If 7PM-7AM, please contact night-coverage [email or web address] Password [facility] 12/19/2012, 2:39 AM

## 2012-12-19 NOTE — Progress Notes (Signed)
NTG weaning per Dr Benjamine Mola orders; orders received for goal to keep SBP less than 160; no orders received for paramaters for DBP/MAP; will continue to monitor

## 2012-12-19 NOTE — Progress Notes (Signed)
Dr Benjamine Mola paged regarding pt's BP 85/54; orders received; pt c no current voiced complaints; will continue to monitor

## 2012-12-19 NOTE — ED Provider Notes (Signed)
CSN: [id number]     Arrival date & time 12/18/12  1938 History   First MD Initiated Contact with Patient 12/18/12 2339     Chief Complaint  Patient presents with  . Hypertension   (Consider location/radiation/quality/duration/timing/severity/associated sxs/prior Treatment) HPI History provided by patient. Has history of brain aneurysm with bleed as well as hypertension. His blood pressure is not well controlled over the last few days with recent medication changes. He expresses concern about previous brain aneurysm in a few days ago noticed some floaters in his left eye. He denies any problems with his eyes since that time. No vision changes tonight. No unilateral weakness or numbness. No difficulty with speech or gait. No chest pain or shortness of breath. He does feel lightheaded dizzy tonight. Past Medical History  Diagnosis Date  . Hypertension   . Ulcer   . Brain aneurysm   . Epistaxis, recurrent    Past Surgical History  Procedure Laterality Date  . Craniotomy Right 06/14/2012    Procedure: CRANIOTOMY HEMATOMA EVACUATION SUBDURAL;  Surgeon: [name], MD;  Location: [facility];  Service: Neurosurgery;  Laterality: Right;   Family History  Problem Relation Age of Onset  . Hypertension Mother   . Hypertension Father   . Cancer Brother     leukemia  . Renal Disease Brother    History  Substance Use Topics  . Smoking status: Never Smoker   . Smokeless tobacco: Not on file  . Alcohol Use: No    Review of Systems  Constitutional: Negative for fever and chills.  HENT: Negative for neck pain and neck stiffness.   Eyes: Positive for visual disturbance.  Respiratory: Negative for shortness of breath.   Gastrointestinal: Negative for abdominal pain.  Genitourinary: Negative for dysuria.  Musculoskeletal: Negative for back pain.  Skin: Negative for rash.  Neurological: Positive for dizziness. Negative for headaches.  All other systems reviewed and are  negative.    Allergies  Clonidine derivatives  Home Medications   Current Outpatient Rx  Name  Route  Sig  Dispense  Refill  . acetaminophen (TYLENOL) 500 MG tablet   Oral   Take 500 mg by mouth every 6 (six) hours as needed for pain.         Marland Kitchen amLODipine (NORVASC) 10 MG tablet   Oral   Take 1 tablet (10 mg total) by mouth daily.   30 tablet   1   . atenolol (TENORMIN) 50 MG tablet   Oral   Take 1 tablet (50 mg total) by mouth daily.   30 tablet   0   . carbamazepine (TEGRETOL XR) 200 MG 12 hr tablet   Oral   Take 1 tablet (200 mg total) by mouth 2 (two) times daily.   60 tablet   1   . diphenhydramine-acetaminophen (TYLENOL PM) 25-500 MG TABS   Oral   Take 0.5 tablets by mouth at bedtime as needed (sleep/pain).         . hydrochlorothiazide (HYDRODIURIL) 25 MG tablet   Oral   Take 12.5 mg by mouth daily.         Marland Kitchen lisinopril (PRINIVIL,ZESTRIL) 40 MG tablet   Oral   Take 80 mg by mouth every morning.          . Multiple Vitamins-Minerals (MULTIVITAMIN) tablet   Oral   Take 1 tablet by mouth daily.          BP 194/104  Pulse 47  Temp(Src) 98.1 F (36.7  C) (Oral)  Resp 16  Ht 5\' 6"  (1.676 m)  Wt 152 lb (68.947 kg)  BMI 24.55 kg/m2  SpO2 100% Physical Exam  Constitutional: He is oriented to person, place, and time. He appears well-developed and well-nourished.  HENT:  Head: Normocephalic and atraumatic.  Eyes: Conjunctivae and EOM are normal. Pupils are equal, round, and reactive to light.  Neck: Full passive range of motion without pain. Neck supple. No thyromegaly present.  No meningismus  Cardiovascular: Normal rate, regular rhythm, S1 normal, S2 normal and intact distal pulses.   Pulmonary/Chest: Effort normal and breath sounds normal.  Abdominal: Soft. Bowel sounds are normal. There is no tenderness. There is no CVA tenderness.  Musculoskeletal: Normal range of motion.  Neurological: He is alert and oriented to person, place, and time.  He has normal strength and normal reflexes. No cranial nerve deficit or sensory deficit. He displays a negative Romberg sign. GCS eye subscore is 4. GCS verbal subscore is 5. GCS motor subscore is 6.  Normal Gait  Skin: Skin is warm and dry. No rash noted. No cyanosis. Nails show no clubbing.  Psychiatric: He has a normal mood and affect. His speech is normal and behavior is normal.    ED Course  Procedures (including critical care time) Labs Review Labs Reviewed  CBC - Abnormal; Notable for the following:    RBC 5.84 (*)    Hemoglobin 17.6 (*)    MCHC 36.7 (*)    All other components within normal limits  POCT I-STAT, CHEM 8 - Abnormal; Notable for the following:    Sodium 133 (*)    Glucose, Bld 100 (*)    All other components within normal limits   Imaging Review Ct Head Wo Contrast  12/19/2012   *RADIOLOGY REPORT*  Clinical Data: Hypertension  CT HEAD WITHOUT CONTRAST  Technique:  Contiguous axial images were obtained from the base of the skull through the vertex without contrast.  Comparison: Prior CT from 10/24/2012  Findings: Postoperative changes from prior right frontoparietal craniotomy are again seen.  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  There is no acute intracranial hemorrhage or infarct.  Mild chronic microvascular ischemic disease is stable.  No midline shift or mass lesion.  No hydrocephalous.  No extra-axial fluid collection.  The orbital soft tissues are normal.  Paranasal sinuses and mastoid air cells are clear.  IMPRESSION: 1.  No acute intracranial process identified. 2.  Stable right frontoparietal craniotomy with underlying encephalomalacia and gliosis at the site of previous hemorrhage.   Original Report Authenticated By: Rise Mu, M.D.   IV NTG drip.     Date: 12/19/2012  Rate: 48  Rhythm: sinus bradycardia  QRS Axis: left  Intervals: normal  ST/T Wave abnormalities: nonspecific ST/T changes   Conduction Disutrbances:none  Narrative Interpretation:   Old EKG Reviewed: no sig changes  12:56 AM d/w Dr Julian Reil, plan admit SDU MDM  DX: HTN urgency, h/o ICH  EKG, labs, CT brain Med admit  Sunnie Nielsen, MD 12/20/12 [phone]

## 2012-12-19 NOTE — Progress Notes (Signed)
Patient admitted early this AM by Dr. Julian Reil- change lisinopril to 40 daily, add hydralazine, check renal artery.  Has h/o ICH.  Remain in SDU until nitro gtt off.  Marlin Canary DO

## 2012-12-19 NOTE — ED Notes (Signed)
Report has been called to 2900 but the pt still needs a head c-t before he goes up

## 2012-12-19 NOTE — ED Notes (Signed)
Admitting doctor here to see.  bp still up when standing

## 2012-12-19 NOTE — ED Notes (Signed)
Seizure med  Requested from pharmacy.  Iv started.  Xray  Here  To take back there.  Nitro drip has not been started and will be started when he returns from xray

## 2012-12-20 DIAGNOSIS — E871 Hypo-osmolality and hyponatremia: Secondary | ICD-10-CM

## 2012-12-20 LAB — CBC
HCT: 43.7 % (ref 39.0–52.0)
MCV: 82 fL (ref 78.0–100.0)
RBC: 5.33 MIL/uL (ref 4.22–5.81)
RDW: 12.1 % (ref 11.5–15.5)
WBC: 5.2 10*3/uL (ref 4.0–10.5)

## 2012-12-20 LAB — BASIC METABOLIC PANEL
BUN: 21 mg/dL (ref 6–23)
Chloride: 94 mEq/L — ABNORMAL LOW (ref 96–112)
GFR calc Af Amer: 76 mL/min — ABNORMAL LOW (ref >90)
GFR calc non Af Amer: 66 mL/min — ABNORMAL LOW (ref >90)
Potassium: 3.7 mEq/L (ref 3.5–5.1)

## 2012-12-20 MED ORDER — HYDRALAZINE HCL 25 MG PO TABS
25.0000 mg | ORAL_TABLET | Freq: Four times a day (QID) | ORAL | Status: DC
  Administered 2012-12-20 – 2012-12-21 (×5): 25 mg via ORAL
  Filled 2012-12-20 (×8): qty 1

## 2012-12-20 MED ORDER — ATENOLOL 25 MG PO TABS
25.0000 mg | ORAL_TABLET | Freq: Every day | ORAL | Status: DC
  Administered 2012-12-21: 25 mg via ORAL
  Filled 2012-12-20 (×2): qty 1

## 2012-12-20 NOTE — Progress Notes (Signed)
Report given to [facility]

## 2012-12-20 NOTE — Progress Notes (Signed)
Renal artery duplex completed.  Bilateral:  No evidence of significant renal artery stenosis.  Cysts are noted.

## 2012-12-20 NOTE — Progress Notes (Signed)
[facility] PROGRESS NOTE  Luis Oconnor ZOX:[id number] DOB: 1955/10/29 DOA: 12/18/2012 PCP: Willey Blade, MD  Assessment/Plan: 1. HTN- suspect patient was not taking medications at home- will decrease BB as HR low, add hydralazine, d/c HCTZ as Na decreasing, continue norvasc- spoke with patient regarding compliance 2. Headache- due to elevated BP, has h/o of ICH  Code Status: full Family Communication: patient Disposition Plan: home in AM- tx to tele bed   Consultants:    Procedures:  RAD  Antibiotics:    HPI/Subjective: Doing well, no new c/o  Objective: Filed Vitals:   12/20/12 0757  BP: 140/84  Pulse:   Temp: 97.5 F (36.4 C)  Resp: 19    Intake/Output Summary (Last 24 hours) at 12/20/12 0835 Last data filed at 12/20/12 0000  Gross per 24 hour  Intake  657.5 ml  Output   1150 ml  Net -492.5 ml   Filed Weights   12/18/12 1943 12/19/12 0245  Weight: 68.947 kg (152 lb) 67.5 kg (148 lb 13 oz)    Exam:   General:  A+Ox3, NAD  Cardiovascular: rrr  Respiratory: clear anterior  Abdomen: +BS, soft, NT  Musculoskeletal: moves all 4 ext  Data Reviewed: Basic Metabolic Panel:  Recent Labs Lab 12/19/12 0002 12/19/12 0525 12/20/12 0435  NA 133* 129* 131*  K 3.9 4.1 3.7  CL 99 95* 94*  CO2  --  25 28  GLUCOSE 100* 95 110*  BUN 19 17 21   CREATININE 1.10 0.94 1.18  CALCIUM  --  9.2 9.1   Liver Function Tests: No results found for this basename: AST, ALT, ALKPHOS, BILITOT, PROT, ALBUMIN,  in the last 168 hours No results found for this basename: LIPASE, AMYLASE,  in the last 168 hours No results found for this basename: AMMONIA,  in the last 168 hours CBC:  Recent Labs Lab 12/18/12 0050 12/19/12 0002 12/19/12 0525 12/20/12 0435  WBC 6.9  --  6.1 5.2  HGB 17.6* 16.7 15.2 15.9  HCT 48.0 49.0 41.6 43.7  MCV 82.2  --  82.2 82.0  PLT 221  --  237 215   Cardiac Enzymes: No results found for this basename: CKTOTAL, CKMB, CKMBINDEX,  TROPONINI,  in the last 168 hours BNP (last 3 results) No results found for this basename: PROBNP,  in the last 8760 hours CBG: No results found for this basename: GLUCAP,  in the last 168 hours  Recent Results (from the past 240 hour(s))  MRSA PCR SCREENING     Status: None   Collection Time    12/19/12  2:43 AM      Result Value Range Status   MRSA by PCR NEGATIVE  NEGATIVE Final   Comment:            The GeneXpert MRSA Assay (FDA     approved for NASAL specimens     only), is one component of a     comprehensive MRSA colonization     surveillance program. It is not     intended to diagnose MRSA     infection nor to guide or     monitor treatment for     MRSA infections.     Studies: Ct Head Wo Contrast  12/19/2012   *RADIOLOGY REPORT*  Clinical Data: Hypertension  CT HEAD WITHOUT CONTRAST  Technique:  Contiguous axial images were obtained from the base of the skull through the vertex without contrast.  Comparison: Prior CT from 10/24/2012  Findings: Postoperative changes from  prior right frontoparietal craniotomy are again seen.  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  There is no acute intracranial hemorrhage or infarct.  Mild chronic microvascular ischemic disease is stable.  No midline shift or mass lesion.  No hydrocephalous.  No extra-axial fluid collection.  The orbital soft tissues are normal.  Paranasal sinuses and mastoid air cells are clear.  IMPRESSION: 1.  No acute intracranial process identified. 2.  Stable right frontoparietal craniotomy with underlying encephalomalacia and gliosis at the site of previous hemorrhage.   Original Report Authenticated By: Rise Mu, M.D.    Scheduled Meds: . amLODipine  10 mg Oral Daily  . atenolol  25 mg Oral Daily  . carbamazepine  200 mg Oral BID  . heparin  5,000 Units Subcutaneous Q8H  . hydrALAZINE  25 mg Oral Q6H  . lisinopril  40 mg Oral q morning - 10a  .  multivitamin with minerals  1 tablet Oral Daily  . sodium chloride  3 mL Intravenous Q12H   Continuous Infusions: . nitroGLYCERIN Stopped (12/19/12 1000)    Principal Problem:   Hypertension, accelerated Active Problems:   HTN (hypertension)   Headache(784.0)    Time spent: 25    [facility], JESSICA  [facility] Pager [phone]. If 7PM-7AM, please contact night-coverage at [email or web address], password [facility] 12/20/2012, 8:35 AM  LOS: 2 days

## 2012-12-21 DIAGNOSIS — I619 Nontraumatic intracerebral hemorrhage, unspecified: Secondary | ICD-10-CM

## 2012-12-21 MED ORDER — LISINOPRIL 40 MG PO TABS: 40.0000 mg | ORAL_TABLET | Freq: Every morning | ORAL | Status: DC

## 2012-12-21 MED ORDER — HYDRALAZINE HCL 25 MG PO TABS: 25.0000 mg | ORAL_TABLET | Freq: Four times a day (QID) | ORAL | Status: DC

## 2012-12-21 MED ORDER — HYDROCODONE-ACETAMINOPHEN 5-325 MG PO TABS
1.0000 | ORAL_TABLET | Freq: Once | ORAL | Status: AC
  Administered 2012-12-21: 1 via ORAL
  Filled 2012-12-21: qty 1

## 2012-12-21 MED ORDER — ATENOLOL 25 MG PO TABS: 25.0000 mg | ORAL_TABLET | Freq: Every day | ORAL | Status: DC

## 2012-12-21 NOTE — Care Management Note (Signed)
    Page 1 of 1   12/21/2012     3:46:33 PM   CARE MANAGEMENT NOTE 12/21/2012  Patient:  Luis Oconnor, Luis Oconnor   Account Number:  [id number]  Date Initiated:  12/21/2012  Documentation initiated by:  Nusayba Cadenas  Subjective/Objective Assessment:   PT ADM ON 12/18/12 WITH DIZZINESS, HA, AND HYPERTENSIVE CRISIS.  PTA, PT INDEPENDENT, LIVES WITH GIRLFRIEND.     Action/Plan:   PT FOR DC HOME TODAY; NO DISCHARGE NEEDS IDENTIFIED.   Anticipated DC Date:  12/21/2012   Anticipated DC Plan:  HOME/SELF CARE      DC Planning Services  CM consult      Choice offered to / List presented to:             Status of service:  Completed, signed off Medicare Important Message given?   (If response is "NO", the following Medicare IM given date fields will be blank) Date Medicare IM given:   Date Additional Medicare IM given:    Discharge Disposition:  HOME/SELF CARE  Per UR Regulation:  Reviewed for med. necessity/level of care/duration of stay  If discussed at Long Length of Stay Meetings, dates discussed:    Comments:

## 2012-12-21 NOTE — Progress Notes (Signed)
12/21/12 Nursing note  Patient given AVS, discharge instructions, medication list and paper prescriptions. DR Benjamine Mola made aware of patient BP this AM and recheck was 158/97. DR Benjamine Mola stated OK to discharge home with follow up within a week.  Will discharge home as ordered. Gio Janoski, Randall An RN

## 2012-12-21 NOTE — Discharge Summary (Signed)
Physician Discharge Summary  Luis Oconnor ZOX:[id number] DOB: 1955-07-19 DOA: 12/18/2012  PCP: Willey Blade, MD  Admit date: 12/18/2012 Discharge date: 12/21/2012  Time spent: 35 minutes  Recommendations for Outpatient Follow-up:  1. Titration BP medications  Discharge Diagnoses:  Principal Problem:   Hypertension, accelerated Active Problems:   HTN (hypertension)   Headache(784.0)   Discharge Condition: improved  Diet recommendation: cardiac  Filed Weights   12/18/12 1943 12/19/12 0245 12/21/12 0519  Weight: 68.947 kg (152 lb) 67.5 kg (148 lb 13 oz) 66.4 kg (146 lb 6.2 oz)    History of present illness:  Luis Oconnor is a 57 y.o. male who presents with headache and dizziness. He states he is also having visual disturbance including "seeing spots" at times. He took his BP at home (as he has had very high readings with these symptoms in the past) and sure enough, he noted his SBPs to be in the 190s or even over 200. He presented to the ED. In the ED his BPs continued to run high, however he was bradycardic into the 40s, so he was placed on a nitro GTT and hospitalist asked to admit for hypertensive urgency.   Hospital Course:  HTN- suspect patient was not taking medications at home- will decrease BB as HR low, add hydralazine, d/c HCTZ as Na decreasing, continue norvasc- spoke with patient regarding compliance  Headache- due to elevated BP, has h/o of ICH   Procedures:    Consultations:  none  Discharge Exam: Filed Vitals:   12/21/12 1329  BP: 158/97  Pulse:   Temp:   Resp:     General: A+Ox3, NAD Cardiovascular: rrr Respiratory: clear anterior  Discharge Instructions      Discharge Orders   Future Orders Complete By Expires   Diet - low sodium heart healthy  As directed    Discharge instructions  As directed    Comments:     BMP 1 week   Increase activity slowly  As directed        Medication List    STOP taking these medications       diphenhydramine-acetaminophen 25-500 MG Tabs  Commonly known as:  TYLENOL PM     hydrochlorothiazide 25 MG tablet  Commonly known as:  HYDRODIURIL      TAKE these medications       acetaminophen 500 MG tablet  Commonly known as:  TYLENOL  Take 500 mg by mouth every 6 (six) hours as needed for pain.     amLODipine 10 MG tablet  Commonly known as:  NORVASC  Take 1 tablet (10 mg total) by mouth daily.     atenolol 25 MG tablet  Commonly known as:  TENORMIN  Take 1 tablet (25 mg total) by mouth daily.     carbamazepine 200 MG 12 hr tablet  Commonly known as:  TEGRETOL XR  Take 1 tablet (200 mg total) by mouth 2 (two) times daily.     hydrALAZINE 25 MG tablet  Commonly known as:  APRESOLINE  Take 1 tablet (25 mg total) by mouth every 6 (six) hours.     lisinopril 40 MG tablet  Commonly known as:  PRINIVIL,ZESTRIL  Take 1 tablet (40 mg total) by mouth every morning.     multivitamin tablet  Take 1 tablet by mouth daily.       Allergies  Allergen Reactions  . Clonidine Derivatives Nausea Only   Follow-up Information   Follow up with August Saucer, ERIC, MD In  1 week.   Specialty:  Internal Medicine   Contact information:   [facility] [address] [place] [address] [phone]        The results of significant diagnostics from this hospitalization (including imaging, microbiology, ancillary and laboratory) are listed below for reference.    Significant Diagnostic Studies: Ct Head Wo Contrast  12/19/2012   *RADIOLOGY REPORT*  Clinical Data: Hypertension  CT HEAD WITHOUT CONTRAST  Technique:  Contiguous axial images were obtained from the base of the skull through the vertex without contrast.  Comparison: Prior CT from 10/24/2012  Findings: Postoperative changes from prior right frontoparietal craniotomy are again seen.  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  There is no acute intracranial  hemorrhage or infarct.  Mild chronic microvascular ischemic disease is stable.  No midline shift or mass lesion.  No hydrocephalous.  No extra-axial fluid collection.  The orbital soft tissues are normal.  Paranasal sinuses and mastoid air cells are clear.  IMPRESSION: 1.  No acute intracranial process identified. 2.  Stable right frontoparietal craniotomy with underlying encephalomalacia and gliosis at the site of previous hemorrhage.   Original Report Authenticated By: Rise Mu, M.D.    Microbiology: Recent Results (from the past 240 hour(s))  MRSA PCR SCREENING     Status: None   Collection Time    12/19/12  2:43 AM      Result Value Range Status   MRSA by PCR NEGATIVE  NEGATIVE Final   Comment:            The GeneXpert MRSA Assay (FDA     approved for NASAL specimens     only), is one component of a     comprehensive MRSA colonization     surveillance program. It is not     intended to diagnose MRSA     infection nor to guide or     monitor treatment for     MRSA infections.     Labs: Basic Metabolic Panel:  Recent Labs Lab 12/19/12 0002 12/19/12 0525 12/20/12 0435  NA 133* 129* 131*  K 3.9 4.1 3.7  CL 99 95* 94*  CO2  --  25 28  GLUCOSE 100* 95 110*  BUN 19 17 21   CREATININE 1.10 0.94 1.18  CALCIUM  --  9.2 9.1   Liver Function Tests: No results found for this basename: AST, ALT, ALKPHOS, BILITOT, PROT, ALBUMIN,  in the last 168 hours No results found for this basename: LIPASE, AMYLASE,  in the last 168 hours No results found for this basename: AMMONIA,  in the last 168 hours CBC:  Recent Labs Lab 12/18/12 0050 12/19/12 0002 12/19/12 0525 12/20/12 0435  WBC 6.9  --  6.1 5.2  HGB 17.6* 16.7 15.2 15.9  HCT 48.0 49.0 41.6 43.7  MCV 82.2  --  82.2 82.0  PLT 221  --  237 215   Cardiac Enzymes: No results found for this basename: CKTOTAL, CKMB, CKMBINDEX, TROPONINI,  in the last 168 hours BNP: BNP (last 3 results) No results found for this  basename: PROBNP,  in the last 8760 hours CBG: No results found for this basename: GLUCAP,  in the last 168 hours     Signed:  Marlin Canary  [facility] 12/22/2012, 3:34 PM

## 2013-01-31 ENCOUNTER — Emergency Department (HOSPITAL_COMMUNITY)
Admission: EM | Admit: 2013-01-31 | Discharge: 2013-01-31 | Disposition: A | Discharge status: Home / Self Care | Payer: Medicaid Other | Attending: Emergency Medicine | Admitting: Emergency Medicine

## 2013-01-31 ENCOUNTER — Encounter (HOSPITAL_COMMUNITY): Payer: Self-pay | Admitting: Emergency Medicine

## 2013-01-31 ENCOUNTER — Emergency Department (HOSPITAL_COMMUNITY): Payer: Medicaid Other

## 2013-01-31 DIAGNOSIS — Z8679 Personal history of other diseases of the circulatory system: Secondary | ICD-10-CM | POA: Insufficient documentation

## 2013-01-31 DIAGNOSIS — Z888 Allergy status to other drugs, medicaments and biological substances status: Secondary | ICD-10-CM | POA: Insufficient documentation

## 2013-01-31 DIAGNOSIS — R519 Headache, unspecified: Secondary | ICD-10-CM

## 2013-01-31 DIAGNOSIS — Z79899 Other long term (current) drug therapy: Secondary | ICD-10-CM | POA: Insufficient documentation

## 2013-01-31 DIAGNOSIS — R51 Headache: Secondary | ICD-10-CM | POA: Insufficient documentation

## 2013-01-31 DIAGNOSIS — I10 Essential (primary) hypertension: Secondary | ICD-10-CM | POA: Insufficient documentation

## 2013-01-31 LAB — CBC WITH DIFFERENTIAL/PLATELET
HCT: 44.3 % (ref 39.0–52.0)
Hemoglobin: 16.1 g/dL (ref 13.0–17.0)
Lymphocytes Relative: 3 % — ABNORMAL LOW (ref 12–46)
Lymphs Abs: 0.3 10*3/uL — ABNORMAL LOW (ref 0.7–4.0)
MCHC: 36.3 g/dL — ABNORMAL HIGH (ref 30.0–36.0)
Monocytes Absolute: 0.3 10*3/uL (ref 0.1–1.0)
Monocytes Relative: 3 % (ref 3–12)
Neutro Abs: 9.7 10*3/uL — ABNORMAL HIGH (ref 1.7–7.7)
RBC: 5.4 MIL/uL (ref 4.22–5.81)
WBC: 10.3 10*3/uL (ref 4.0–10.5)

## 2013-01-31 LAB — BASIC METABOLIC PANEL
BUN: 15 mg/dL (ref 6–23)
CO2: 24 mEq/L (ref 19–32)
Chloride: 97 mEq/L (ref 96–112)
Creatinine, Ser: 0.95 mg/dL (ref 0.50–1.35)
Glucose, Bld: 128 mg/dL — ABNORMAL HIGH (ref 70–99)

## 2013-01-31 MED ORDER — ACETAMINOPHEN 500 MG PO TABS
1000.0000 mg | ORAL_TABLET | Freq: Once | ORAL | Status: AC
  Administered 2013-01-31: 1000 mg via ORAL
  Filled 2013-01-31: qty 2

## 2013-01-31 MED ORDER — PROCHLORPERAZINE EDISYLATE 5 MG/ML IJ SOLN
10.0000 mg | Freq: Four times a day (QID) | INTRAMUSCULAR | Status: DC | PRN
  Administered 2013-01-31: 10 mg via INTRAVENOUS
  Filled 2013-01-31: qty 2

## 2013-01-31 MED ORDER — DIPHENHYDRAMINE HCL 50 MG/ML IJ SOLN
25.0000 mg | Freq: Once | INTRAMUSCULAR | Status: AC
  Administered 2013-01-31: 25 mg via INTRAVENOUS
  Filled 2013-01-31: qty 1

## 2013-01-31 NOTE — ED Notes (Signed)
H/a x 2 days w/ htn has not any otc meds but has taken his bp meds

## 2013-01-31 NOTE — ED Provider Notes (Signed)
CSN: [id number]     Arrival date & time 01/31/13  1508 History   First MD Initiated Contact with Patient 01/31/13 1734     Chief Complaint  Patient presents with  . Headache   (Consider location/radiation/quality/duration/timing/severity/associated sxs/prior Treatment) HPI Onset was last night, gradual onset, worsning. Pt with h/o brain bleed. Pt concerned this has occurred again.   The pain is severe, located to front of head. Modifying factors: none.  Associated symptoms: flashing lights, no recent head trauma, no fever.  Recent medical care: no meds taken at home. Pt has been on his HTN medications. Pt had a craniotomy here about 8 months ago.   Past Medical History  Diagnosis Date  . Hypertension   . Ulcer   . Brain aneurysm   . Epistaxis, recurrent    Past Surgical History  Procedure Laterality Date  . Craniotomy Right 06/14/2012    Procedure: CRANIOTOMY HEMATOMA EVACUATION SUBDURAL;  Surgeon: [name], MD;  Location: [facility];  Service: Neurosurgery;  Laterality: Right;   Family History  Problem Relation Age of Onset  . Hypertension Mother   . Hypertension Father   . Cancer Brother     leukemia  . Renal Disease Brother    History  Substance Use Topics  . Smoking status: Never Smoker   . Smokeless tobacco: Not on file  . Alcohol Use: No    Review of Systems Constitutional: Negative for fever.  Eyes: Negative for vision loss.  ENT: Negative for difficulty swallowing.  Cardiovascular: Negative for chest pain. Respiratory: Negative for respiratory distress.  Gastrointestinal:  Negative for vomiting.  Genitourinary: Negative for inability to void.  Musculoskeletal: Negative for gait problem.  Integumentary: Negative for rash.  Neurological: Negative for new focal weakness.     Allergies  Clonidine derivatives  Home Medications   Current Outpatient Rx  Name  Route  Sig  Dispense  Refill  . acetaminophen (TYLENOL) 500 MG tablet   Oral   Take 500 mg  by mouth every 6 (six) hours as needed for pain.         Marland Kitchen amLODipine (NORVASC) 10 MG tablet   Oral   Take 1 tablet (10 mg total) by mouth daily.   30 tablet   1   . atenolol (TENORMIN) 25 MG tablet   Oral   Take 12.5 mg by mouth daily.         . carbamazepine (TEGRETOL XR) 200 MG 12 hr tablet   Oral   Take 1 tablet (200 mg total) by mouth 2 (two) times daily.   60 tablet   1   . hydrALAZINE (APRESOLINE) 25 MG tablet   Oral   Take 1 tablet (25 mg total) by mouth every 6 (six) hours.   120 tablet   0   . lisinopril (PRINIVIL,ZESTRIL) 40 MG tablet   Oral   Take 1 tablet (40 mg total) by mouth every morning.   30 tablet   0   . Multiple Vitamins-Minerals (MULTIVITAMIN) tablet   Oral   Take 1 tablet by mouth daily.          BP 129/80  Pulse 78  Temp(Src) 98.3 F (36.8 C) (Oral)  Resp 18  SpO2 97% Physical Exam Nursing note and vitals reviewed.  Constitutional: Pt is alert and appears stated age. Eyes: No injection, no scleral icterus. EOMI. HENT: Atraumatic, airway open without erythema or exudate.   Respiratory: No respiratory distress. Equal breathing bilaterally. Cardiovascular: Normal rate. Extremities  warm and well perfused.  Abdomen: Soft, non-tender. MSK: Extremities are atraumatic without deformity. Skin: No rash, no wounds.   Neuro: No motor nor sensory deficit. GCS 15. CN intact. Coordination in tact.      ED Course  Procedures (including critical care time) Labs Review Labs Reviewed  CBC WITH DIFFERENTIAL - Abnormal; Notable for the following:    MCHC 36.3 (*)    Neutrophils Relative % 94 (*)    Neutro Abs 9.7 (*)    Lymphocytes Relative 3 (*)    Lymphs Abs 0.3 (*)    All other components within normal limits  BASIC METABOLIC PANEL - Abnormal; Notable for the following:    Sodium 132 (*)    Glucose, Bld 128 (*)    GFR calc non Af Amer 89 (*)    All other components within normal limits   Imaging Review Ct Head Wo  Contrast  01/31/2013   CLINICAL DATA:  Headache for 2 days.  EXAM: CT HEAD WITHOUT CONTRAST  TECHNIQUE: Contiguous axial images were obtained from the base of the skull through the vertex without intravenous contrast.  COMPARISON:  Head CT 12/18/2012.  FINDINGS: Encephalomalacia in the right temporal, parietal and occipital lobes is stable. There are stable underlying right frontal craniotomy changes. No acute intracranial hemorrhage, mass lesion, brain edema or extra-axial fluid collection is demonstrated. Patchy periventricular white matter disease is unchanged. There is no hydrocephalus.  Mucosal thickening is present throughout the ethmoid sinuses. The additional visualized sinuses, mastoid air cells and middle ears are clear. No acute calvarial findings are seen.  IMPRESSION: 1. No acute intracranial findings. 2. Stable right hemispheric encephalomalacia and postsurgical changes. 3. Chronic ethmoid sinus mucosal thickening does not appear significantly changed.   Electronically Signed   By: Roxy Horseman M.D.   On: 01/31/2013 19:24    EKG Interpretation   None       MDM   1. Headache   2. Hypertension    57 y.o. male w/ PMHx of HTN, intraparenchymal hemorrhage s/p craniotomy presents w/ headache. Vitas with HTN. Pt states he is compliant with medications. Gradual onset. Not c/w SAH. Unremarkable neuro exam. CT head to r/o bleed. Labs, IV compazine, IV benadryl for symptoms.   Ct head with NAICA. Pt blood pressure improved to SBP 160s then SBP 140s. Chart review shows multiple prior ED visits, admissions for same. Pt headache 8/10 on re-eval but given tylenol with significant improvement. Feel pt safe for d/c home, outpatient follow up. Counseling provided regarding diagnosis, treatment plan, follow up recommendations, and return precautions. Questions answered.       I independently viewed, interpreted, and used in my medical decision making all ordered lab and imaging tests. Medical  Decision Making discussed with ED attending Dr. Juleen China.       Charm Barges, MD 02/01/13 0001

## 2013-02-11 NOTE — ED Provider Notes (Signed)
I saw and evaluated the patient, reviewed the resident's note and I agree with the findings and plan.  EKG Interpretation   None       57yM with HA. Much improved. Multiple evaluations for similar and poorly controlled BP. Neuro exam nonfocal. CT head neg. Consider emergent secondary causes such as bleed, infectious or mass but doubt. There is no history of trauma. Pt has a nonfocal neurological exam. Afebrile and neck supple. No use of blood thinning medication. Consider ocular etiology such as acute angle closure glaucoma but doubt. Pt denies acute change in visual acuity and eye exam unremarkable. Doubt temporal arteritis. no temporal tenderness and temporal artery pulsations palpable. Doubt CO poisoning. No contacts with similar symptoms. Doubt venous thrombosis. Doubt carotid or vertebral arteries dissection.  Feel that can be safely discharged, but strict return precautions discussed. Outpt fu.   Raeford Razor, MD 02/11/13 1036

## 2013-03-09 ENCOUNTER — Encounter (HOSPITAL_COMMUNITY): Payer: Self-pay | Admitting: Emergency Medicine

## 2013-03-09 ENCOUNTER — Emergency Department (HOSPITAL_COMMUNITY)
Admission: EM | Admit: 2013-03-09 | Discharge: 2013-03-09 | Disposition: A | Discharge status: Home / Self Care | Payer: Self-pay | Attending: Emergency Medicine | Admitting: Emergency Medicine

## 2013-03-09 ENCOUNTER — Emergency Department (HOSPITAL_COMMUNITY): Payer: Self-pay

## 2013-03-09 DIAGNOSIS — Z9889 Other specified postprocedural states: Secondary | ICD-10-CM | POA: Insufficient documentation

## 2013-03-09 DIAGNOSIS — R42 Dizziness and giddiness: Secondary | ICD-10-CM | POA: Insufficient documentation

## 2013-03-09 DIAGNOSIS — Z79899 Other long term (current) drug therapy: Secondary | ICD-10-CM | POA: Insufficient documentation

## 2013-03-09 DIAGNOSIS — R51 Headache: Secondary | ICD-10-CM | POA: Insufficient documentation

## 2013-03-09 DIAGNOSIS — Z872 Personal history of diseases of the skin and subcutaneous tissue: Secondary | ICD-10-CM | POA: Insufficient documentation

## 2013-03-09 DIAGNOSIS — I10 Essential (primary) hypertension: Secondary | ICD-10-CM | POA: Insufficient documentation

## 2013-03-09 LAB — BASIC METABOLIC PANEL
BUN: 11 mg/dL (ref 6–23)
Chloride: 99 mEq/L (ref 96–112)
Creatinine, Ser: 0.96 mg/dL (ref 0.50–1.35)
GFR calc Af Amer: >90 mL/min (ref >90)
GFR calc non Af Amer: 89 mL/min — ABNORMAL LOW (ref >90)

## 2013-03-09 LAB — CBC
HCT: 45.4 % (ref 39.0–52.0)
MCH: 30.5 pg (ref 26.0–34.0)
MCHC: 35.7 g/dL (ref 30.0–36.0)
MCV: 85.3 fL (ref 78.0–100.0)
Platelets: 230 10*3/uL (ref 150–400)
RDW: 12.8 % (ref 11.5–15.5)

## 2013-03-09 LAB — POCT I-STAT TROPONIN I: Troponin i, poc: 0.01 ng/mL (ref 0.00–0.08)

## 2013-03-09 MED ORDER — CLONIDINE HCL 0.1 MG PO TABS
0.1000 mg | ORAL_TABLET | Freq: Once | ORAL | Status: AC
  Administered 2013-03-09: 0.1 mg via ORAL
  Filled 2013-03-09: qty 1

## 2013-03-09 MED ORDER — HYDRALAZINE HCL 25 MG PO TABS
12.5000 mg | ORAL_TABLET | ORAL | Status: AC
  Administered 2013-03-09: 12.5 mg via ORAL
  Filled 2013-03-09: qty 0.5

## 2013-03-09 MED ORDER — OXYCODONE-ACETAMINOPHEN 5-325 MG PO TABS
1.0000 | ORAL_TABLET | Freq: Once | ORAL | Status: AC
  Administered 2013-03-09: 1 via ORAL
  Filled 2013-03-09: qty 1

## 2013-03-09 MED ORDER — HYDRALAZINE HCL 20 MG/ML IJ SOLN
5.0000 mg | Freq: Once | INTRAMUSCULAR | Status: AC
  Administered 2013-03-09: 5 mg via INTRAVENOUS
  Filled 2013-03-09: qty 1

## 2013-03-09 MED ORDER — ACETAMINOPHEN 325 MG PO TABS
650.0000 mg | ORAL_TABLET | Freq: Once | ORAL | Status: AC
  Administered 2013-03-09: 650 mg via ORAL
  Filled 2013-03-09: qty 2

## 2013-03-09 NOTE — ED Provider Notes (Signed)
CSN: [id number]     Arrival date & time 03/09/13  1191 History   First MD Initiated Contact with Patient 03/09/13 1206     Chief Complaint  Patient presents with  . Hypertension   (Consider location/radiation/quality/duration/timing/severity/associated sxs/prior Treatment) HPI Luis Oconnor is a 57 y.o. male who presents emergency department complaining of headache and elevated blood pressure for approximately 3 days. States he has history of hypertension and brain aneurysm for which she had craniotomy with evacuation of subdural hematoma 9 months ago. States since then has had multiple visits here for headaches and elevated blood pressure. States he has hard time keeping his blood pressure under control. He has recently been admitted for the same. Patient states he is compliant with all of his medications. He has not seen his primary care doctor in approximately 3 months. He has been to emergency department multiple times. He states that he took his morning medicines this morning. He reports dizziness when walking and seeing "spots" which made him come to emergency department. Patient actually had an appointment with his primary care doctor today, but he called him and told him of the symptoms he timing and he told to come to emergency department. Patient denies any changes in vision, no weakness or numbness in extremities, no neck pain or stiffness, no fever, no head injury, no nausea or vomiting.  Past Medical History  Diagnosis Date  . Hypertension   . Ulcer   . Brain aneurysm   . Epistaxis, recurrent    Past Surgical History  Procedure Laterality Date  . Craniotomy Right 06/14/2012    Procedure: CRANIOTOMY HEMATOMA EVACUATION SUBDURAL;  Surgeon: [name], MD;  Location: [facility];  Service: Neurosurgery;  Laterality: Right;   Family History  Problem Relation Age of Onset  . Hypertension Mother   . Hypertension Father   . Cancer Brother     leukemia  . Renal Disease  Brother    History  Substance Use Topics  . Smoking status: Never Smoker   . Smokeless tobacco: Not on file  . Alcohol Use: No    Review of Systems  Constitutional: Negative for fever and chills.  Respiratory: Negative for cough, chest tightness and shortness of breath.   Cardiovascular: Negative for chest pain, palpitations and leg swelling.  Gastrointestinal: Negative for nausea, vomiting, abdominal pain, diarrhea and abdominal distention.  Genitourinary: Negative for dysuria, urgency, frequency and hematuria.  Musculoskeletal: Negative for arthralgias, myalgias, neck pain and neck stiffness.  Skin: Negative for rash.  Allergic/Immunologic: Negative for immunocompromised state.  Neurological: Positive for headaches. Negative for dizziness, weakness, light-headedness and numbness.    Allergies  Review of patient's allergies indicates no active allergies.  Home Medications   Current Outpatient Rx  Name  Route  Sig  Dispense  Refill  . acetaminophen (TYLENOL) 500 MG tablet   Oral   Take 500 mg by mouth every 6 (six) hours as needed for pain.         Marland Kitchen amLODipine (NORVASC) 10 MG tablet   Oral   Take 1 tablet (10 mg total) by mouth daily.   30 tablet   1   . atenolol (TENORMIN) 25 MG tablet   Oral   Take 12.5 mg by mouth daily.         . carbamazepine (TEGRETOL XR) 200 MG 12 hr tablet   Oral   Take 1 tablet (200 mg total) by mouth 2 (two) times daily.   60 tablet  1   . hydrALAZINE (APRESOLINE) 25 MG tablet   Oral   Take 12.5 mg by mouth 4 (four) times daily.         Marland Kitchen lisinopril (PRINIVIL,ZESTRIL) 40 MG tablet   Oral   Take 80 mg by mouth daily.         . Multiple Vitamins-Minerals (MULTIVITAMIN) tablet   Oral   Take 1 tablet by mouth daily.          BP 184/106  Pulse 61  Temp(Src) 97.6 F (36.4 C) (Oral)  Resp 12  Ht 5\' 6"  (1.676 m)  Wt 150 lb (68.04 kg)  BMI 24.22 kg/m2  SpO2 100% Physical Exam  Nursing note and vitals  reviewed. Constitutional: He is oriented to person, place, and time. He appears well-developed and well-nourished. No distress.  HENT:  Head: Normocephalic and atraumatic.  Right Ear: External ear normal.  Left Ear: External ear normal.  Nose: Nose normal.  Mouth/Throat: Oropharynx is clear and moist.  Eyes: Conjunctivae and EOM are normal. Pupils are equal, round, and reactive to light.  Neck: Normal range of motion. Neck supple.  Cardiovascular: Normal rate, regular rhythm and normal heart sounds.   Pulmonary/Chest: Effort normal. No respiratory distress. He has no wheezes. He has no rales.  Abdominal: Soft. Bowel sounds are normal. He exhibits no distension. There is no tenderness. There is no rebound.  Musculoskeletal: He exhibits no edema.  Neurological: He is alert and oriented to person, place, and time. He has normal reflexes.  5/5 and equal upper and lower extremity strength bilaterally. Equal grip strength bilaterally. Normal finger to nose and heel to shin. No pronator drift.   Skin: Skin is warm and dry.  Psychiatric: He has a normal mood and affect. His behavior is normal.    ED Course  Procedures (including critical care time) Labs Review Labs Reviewed  BASIC METABOLIC PANEL - Abnormal; Notable for the following:    GFR calc non Af Amer 89 (*)    All other components within normal limits  CBC  POCT I-STAT TROPONIN I   Imaging Review Ct Head Wo Contrast  03/09/2013   CLINICAL DATA:  Headache for 2 days, history hypertension, aneurysm  EXAM: CT HEAD WITHOUT CONTRAST  TECHNIQUE: Contiguous axial images were obtained from the base of the skull through the vertex without intravenous contrast.  COMPARISON:  01/31/2013  FINDINGS: Prior right parietal craniotomy.  Mild atrophy.  Normal ventricular morphology.  No midline shift or mass effect.  Area of encephalomalacia again identified at the right parietotemporal lobes extending to the right occipital region, stable since  previous exams.  Minimal small vessel chronic ischemic changes of deep cerebral white matter.  No intracranial hemorrhage, mass lesion, or evidence acute infarction.  No acute extra-axial fluid collections identified.  Bones and sinuses otherwise unremarkable.  IMPRESSION: Large air of encephalomalacia involving the right parietotemporal lobe extending to the right occipital region.  Prior right craniotomy.  No acute intracranial abnormalities.   Electronically Signed   By: Ulyses Southward M.D.   On: 03/09/2013 12:54    EKG Interpretation   None       MDM   1. Hypertension   2. Headache     And patient with elevated blood pressure at home, and headaches. Symptoms began gradually, 3 days ago. Patient states he was not going to come to emergency department but was told by his primary care doctor today. Patient's blood pressure is elevated in ER and is 190s  over 100s. He has multiple visits here for the same was admitted to the hospital recently for similar presentation. In hospital stay his blood pressure went down easily with blood pressure medications. Noncompliance with differential. I discussed the patient's forms of taking all his medications as prescribed. I treated his blood pressure emergency department with hydralazine, clonidine. His blood pressure improved. His blood pressure is down to 130s over 80s. His headache is improved as well. Patient states he feels well. He wants to be discharged home. Patient will be discharged home, instructed to make sure to take all of his medications, followup with his primary care Dr. He is instructed to return if your symptoms are worsening  Filed Vitals:   03/09/13 1430 03/09/13 1445 03/09/13 1500 03/09/13 1515  BP: 142/89 138/91 138/80 140/88  Pulse: 69 68 66 69  Temp:      TempSrc:      Resp: 15 17 16 17   Height:      Weight:      SpO2: 98% 96% 96% 96%     Lottie Mussel, PA-C 03/09/13 2035

## 2013-03-09 NOTE — ED Notes (Addendum)
Pt with multiple complaints including head and abdominal pain, and cough.  Pt states he has hx of brain surgery.  Pt c/o headache x 2 days.  Pt states his BP is high.  Pt states he blew his nose yesterday and had some bleeding from his nose.  Pt states he quickly got it under control.

## 2013-03-15 NOTE — ED Provider Notes (Signed)
Medical screening examination/treatment/procedure(s) were performed by non-physician practitioner and as supervising physician I was immediately available for consultation/collaboration.  EKG Interpretation   None       Ayn Domangue, MD, FACEP   Der Gagliano L Corgan Mormile, MD 03/15/13 1558 

## 2013-03-29 ENCOUNTER — Emergency Department (HOSPITAL_COMMUNITY): Payer: Self-pay

## 2013-03-29 ENCOUNTER — Emergency Department (HOSPITAL_COMMUNITY)
Admission: EM | Admit: 2013-03-29 | Discharge: 2013-03-29 | Disposition: A | Discharge status: Home / Self Care | Payer: Medicaid Other | Attending: Emergency Medicine | Admitting: Emergency Medicine

## 2013-03-29 ENCOUNTER — Encounter (HOSPITAL_COMMUNITY): Payer: Self-pay | Admitting: Emergency Medicine

## 2013-03-29 DIAGNOSIS — H538 Other visual disturbances: Secondary | ICD-10-CM | POA: Insufficient documentation

## 2013-03-29 DIAGNOSIS — Z79899 Other long term (current) drug therapy: Secondary | ICD-10-CM | POA: Insufficient documentation

## 2013-03-29 DIAGNOSIS — M542 Cervicalgia: Secondary | ICD-10-CM | POA: Insufficient documentation

## 2013-03-29 DIAGNOSIS — I10 Essential (primary) hypertension: Secondary | ICD-10-CM

## 2013-03-29 DIAGNOSIS — Z872 Personal history of diseases of the skin and subcutaneous tissue: Secondary | ICD-10-CM | POA: Insufficient documentation

## 2013-03-29 DIAGNOSIS — R51 Headache: Secondary | ICD-10-CM | POA: Insufficient documentation

## 2013-03-29 LAB — CBC WITH DIFFERENTIAL/PLATELET
Basophils Absolute: 0 10*3/uL (ref 0.0–0.1)
Basophils Relative: 1 % (ref 0–1)
Eosinophils Absolute: 0.1 10*3/uL (ref 0.0–0.7)
Eosinophils Relative: 1 % (ref 0–5)
Hemoglobin: 16.2 g/dL (ref 13.0–17.0)
Lymphs Abs: 0.9 10*3/uL (ref 0.7–4.0)
MCH: 30.8 pg (ref 26.0–34.0)
MCHC: 36.2 g/dL — ABNORMAL HIGH (ref 30.0–36.0)
MCV: 85 fL (ref 78.0–100.0)
Monocytes Relative: 6 % (ref 3–12)
Platelets: 234 10*3/uL (ref 150–400)
RBC: 5.26 MIL/uL (ref 4.22–5.81)

## 2013-03-29 LAB — POCT I-STAT, CHEM 8
BUN: 12 mg/dL (ref 6–23)
Calcium, Ion: 1.19 mmol/L (ref 1.12–1.23)
Creatinine, Ser: 1.1 mg/dL (ref 0.50–1.35)
Glucose, Bld: 105 mg/dL — ABNORMAL HIGH (ref 70–99)
HCT: 48 % (ref 39.0–52.0)
Hemoglobin: 16.3 g/dL (ref 13.0–17.0)
TCO2: 25 mmol/L (ref 0–100)

## 2013-03-29 LAB — CARBAMAZEPINE LEVEL, TOTAL: Carbamazepine Lvl: 5.7 ug/mL (ref 4.0–12.0)

## 2013-03-29 MED ORDER — SODIUM CHLORIDE 0.9 % IV SOLN
INTRAVENOUS | Status: DC
  Administered 2013-03-29: 17:00:00 via INTRAVENOUS

## 2013-03-29 MED ORDER — HYDRALAZINE HCL 20 MG/ML IJ SOLN
10.0000 mg | Freq: Once | INTRAMUSCULAR | Status: AC
  Administered 2013-03-29: 10 mg via INTRAVENOUS
  Filled 2013-03-29: qty 1

## 2013-03-29 MED ORDER — HYDRALAZINE HCL 25 MG PO TABS: 25.0000 mg | ORAL_TABLET | Freq: Four times a day (QID) | ORAL | Status: DC

## 2013-03-29 MED ORDER — ACETAMINOPHEN 325 MG PO TABS
650.0000 mg | ORAL_TABLET | Freq: Once | ORAL | Status: AC
  Administered 2013-03-29: 650 mg via ORAL
  Filled 2013-03-29: qty 2

## 2013-03-29 NOTE — ED Notes (Signed)
Pt. Stated, my BP is high cause Ive been seeing little stars.  This happened around 1300 today.

## 2013-03-29 NOTE — ED Notes (Signed)
MD at bedside. 

## 2013-03-29 NOTE — ED Provider Notes (Signed)
CSN: [id number]     Arrival date & time 03/29/13  1354 History   First MD Initiated Contact with Patient 03/29/13 1611     Chief Complaint  Patient presents with  . Hypertension   (Consider location/radiation/quality/duration/timing/severity/associated sxs/prior Treatment) Patient is a 57 y.o. male presenting with hypertension. The history is provided by the patient.  Hypertension This is a recurrent problem. Pertinent negatives include no chest pain, no abdominal pain, no headaches and no shortness of breath.   patient states that this afternoon he began to feel that his blood pressure is high. He states that he had little dots incision, which is how he normally feels when his blood pressure goes up. He has had history of the same recently. He states he's worried because he's had a aneurysm in the past. He was surgically repaired. He states he's been taking his medication. No chest pain. No trouble breathing. No numbness or weakness. He states he gets a visual changes when his blood pressure goes up. He denies headache, but states he does have pain in his neck and shoulder. He thinks is because he has been sleeping on a couch. He states it is worse in the morning.  Past Medical History  Diagnosis Date  . Hypertension   . Ulcer   . Brain aneurysm   . Epistaxis, recurrent    Past Surgical History  Procedure Laterality Date  . Craniotomy Right 06/14/2012    Procedure: CRANIOTOMY HEMATOMA EVACUATION SUBDURAL;  Surgeon: [name], MD;  Location: [facility];  Service: Neurosurgery;  Laterality: Right;   Family History  Problem Relation Age of Onset  . Hypertension Mother   . Hypertension Father   . Cancer Brother     leukemia  . Renal Disease Brother    History  Substance Use Topics  . Smoking status: Never Smoker   . Smokeless tobacco: Not on file  . Alcohol Use: No    Review of Systems  Constitutional: Negative for activity change and appetite change.  Eyes: Positive  for visual disturbance. Negative for pain.  Respiratory: Negative for chest tightness and shortness of breath.   Cardiovascular: Negative for chest pain and leg swelling.  Gastrointestinal: Negative for nausea, vomiting, abdominal pain and diarrhea.  Genitourinary: Negative for flank pain.  Musculoskeletal: Positive for neck pain. Negative for back pain and neck stiffness.  Skin: Negative for rash.  Neurological: Negative for weakness, numbness and headaches.  Psychiatric/Behavioral: Negative for behavioral problems.    Allergies  Review of patient's allergies indicates no known allergies.  Home Medications   Current Outpatient Rx  Name  Route  Sig  Dispense  Refill  . acetaminophen (TYLENOL) 500 MG tablet   Oral   Take 500 mg by mouth every 6 (six) hours as needed for pain.         Marland Kitchen amLODipine (NORVASC) 10 MG tablet   Oral   Take 1 tablet (10 mg total) by mouth daily.   30 tablet   1   . atenolol (TENORMIN) 25 MG tablet   Oral   Take 12.5 mg by mouth daily.         . carbamazepine (TEGRETOL XR) 200 MG 12 hr tablet   Oral   Take 1 tablet (200 mg total) by mouth 2 (two) times daily.   60 tablet   1   . lisinopril (PRINIVIL,ZESTRIL) 40 MG tablet   Oral   Take 80 mg by mouth daily.         Marland Kitchen  Multiple Vitamins-Minerals (MULTIVITAMIN) tablet   Oral   Take 1 tablet by mouth daily.         . hydrALAZINE (APRESOLINE) 25 MG tablet   Oral   Take 1 tablet (25 mg total) by mouth 4 (four) times daily.   60 tablet   0    BP 149/88  Pulse 71  Temp(Src) 97.8 F (36.6 C) (Oral)  Resp 15  Wt 152 lb (68.947 kg)  SpO2 100% Physical Exam  Nursing note and vitals reviewed. Constitutional: He is oriented to person, place, and time. He appears well-developed and well-nourished.  HENT:  Head: Normocephalic and atraumatic.  Eyes: EOM are normal. Pupils are equal, round, and reactive to light.  Neck: Normal range of motion. Neck supple.  Cardiovascular: Normal rate,  regular rhythm and normal heart sounds.   No murmur heard. Pulmonary/Chest: Effort normal and breath sounds normal.  Abdominal: Soft. Bowel sounds are normal. He exhibits no distension and no mass. There is no tenderness. There is no rebound and no guarding.  Musculoskeletal: Normal range of motion. He exhibits no edema.  Neurological: He is alert and oriented to person, place, and time. No cranial nerve deficit.  Skin: Skin is warm and dry.  Psychiatric: He has a normal mood and affect.    ED Course  Procedures (including critical care time) Labs Review Labs Reviewed  CBC WITH DIFFERENTIAL - Abnormal; Notable for the following:    MCHC 36.2 (*)    Neutrophils Relative % 81 (*)    All other components within normal limits  POCT I-STAT, CHEM 8 - Abnormal; Notable for the following:    Glucose, Bld 105 (*)    All other components within normal limits  CARBAMAZEPINE LEVEL, TOTAL  POCT I-STAT TROPONIN I   Imaging Review Dg Chest Port 1 View  03/29/2013   CLINICAL DATA:  Hypertension  EXAM: PORTABLE CHEST - 1 VIEW  COMPARISON:  10/24/2012  FINDINGS: Low lung volumes. The cardiac silhouette is enlarged. No focal regions of consolidation or focal infiltrates appreciated. There is minimal blunting of the left costophrenic angle. The osseous structures are unremarkable.  IMPRESSION: Scarring versus possibly trace effusion left costophrenic angle region. Otherwise no evidence of acute cardiopulmonary disease. Cardiomegaly.   Electronically Signed   By: Salome Holmes M.D.   On: 03/29/2013 16:41    EKG Interpretation    Date/Time:  Monday March 29 2013 16:41:03 EST Ventricular Rate:  57 PR Interval:  194 QRS Duration: 103 QT Interval:  401 QTC Calculation: 390 R Axis:   -34 Text Interpretation:  Sinus rhythm LVH with secondary repolarization abnormality Baseline wander in lead(s) V5 No significant change since last tracing Confirmed by Jaquanna Ballentine  MD, Donyea Gafford ([id number]) on 03/29/2013 7:55:12  PM            MDM   1. HTN (hypertension)    Patient with headaches like his typical headache with hypertension. Blood pressure was initially seriously A elevated, became down with 10 mg of hydralazine. His headache is improved somewhat. EKG and laboratory reassuring. Doubt intracranial hemorrhage at this time. He appears to be a headache every time his blood pressure goes up. Discussed with Dr. Lendell Caprice from internal medicine. Hydralazine will be increased to 25 mg 4 times a day. Patient will followup with his primary care Dr.    Juliet Rude. Rubin Payor, MD 03/29/13 1956

## 2013-04-23 ENCOUNTER — Emergency Department (HOSPITAL_COMMUNITY): Payer: 59

## 2013-04-23 ENCOUNTER — Encounter (HOSPITAL_COMMUNITY): Payer: Self-pay | Admitting: Emergency Medicine

## 2013-04-23 ENCOUNTER — Emergency Department (HOSPITAL_COMMUNITY)
Admission: EM | Admit: 2013-04-23 | Discharge: 2013-04-23 | Disposition: A | Discharge status: Home / Self Care | Payer: 59 | Attending: Emergency Medicine | Admitting: Emergency Medicine

## 2013-04-23 DIAGNOSIS — R5381 Other malaise: Secondary | ICD-10-CM | POA: Insufficient documentation

## 2013-04-23 DIAGNOSIS — R112 Nausea with vomiting, unspecified: Secondary | ICD-10-CM | POA: Insufficient documentation

## 2013-04-23 DIAGNOSIS — R209 Unspecified disturbances of skin sensation: Secondary | ICD-10-CM | POA: Insufficient documentation

## 2013-04-23 DIAGNOSIS — Z79899 Other long term (current) drug therapy: Secondary | ICD-10-CM | POA: Insufficient documentation

## 2013-04-23 DIAGNOSIS — Z872 Personal history of diseases of the skin and subcutaneous tissue: Secondary | ICD-10-CM | POA: Insufficient documentation

## 2013-04-23 DIAGNOSIS — I10 Essential (primary) hypertension: Secondary | ICD-10-CM

## 2013-04-23 DIAGNOSIS — R5383 Other fatigue: Secondary | ICD-10-CM

## 2013-04-23 LAB — CBC WITH DIFFERENTIAL/PLATELET
Basophils Absolute: 0 10*3/uL (ref 0.0–0.1)
Basophils Relative: 1 % (ref 0–1)
EOS ABS: 0.1 10*3/uL (ref 0.0–0.7)
Eosinophils Relative: 3 % (ref 0–5)
HCT: 45.2 % (ref 39.0–52.0)
Hemoglobin: 16.3 g/dL (ref 13.0–17.0)
LYMPHS ABS: 0.7 10*3/uL (ref 0.7–4.0)
LYMPHS PCT: 20 % (ref 12–46)
MCH: 30.1 pg (ref 26.0–34.0)
MCHC: 36.1 g/dL — ABNORMAL HIGH (ref 30.0–36.0)
MCV: 83.5 fL (ref 78.0–100.0)
Monocytes Absolute: 0.4 10*3/uL (ref 0.1–1.0)
Monocytes Relative: 10 % (ref 3–12)
NEUTROS PCT: 67 % (ref 43–77)
Neutro Abs: 2.4 10*3/uL (ref 1.7–7.7)
PLATELETS: 214 10*3/uL (ref 150–400)
RBC: 5.41 MIL/uL (ref 4.22–5.81)
RDW: 12.1 % (ref 11.5–15.5)
WBC: 3.6 10*3/uL — ABNORMAL LOW (ref 4.0–10.5)

## 2013-04-23 LAB — TROPONIN I: Troponin I: <0.3 ng/mL (ref <0.30)

## 2013-04-23 LAB — COMPREHENSIVE METABOLIC PANEL
ALK PHOS: 85 U/L (ref 39–117)
ALT: 18 U/L (ref 0–53)
AST: 17 U/L (ref 0–37)
Albumin: 3.9 g/dL (ref 3.5–5.2)
BUN: 11 mg/dL (ref 6–23)
CO2: 26 meq/L (ref 19–32)
Calcium: 9.5 mg/dL (ref 8.4–10.5)
Chloride: 101 mEq/L (ref 96–112)
Creatinine, Ser: 0.96 mg/dL (ref 0.50–1.35)
GFR calc non Af Amer: 89 mL/min — ABNORMAL LOW (ref >90)
Glucose, Bld: 81 mg/dL (ref 70–99)
POTASSIUM: 4.3 meq/L (ref 3.7–5.3)
Sodium: 140 mEq/L (ref 137–147)
TOTAL PROTEIN: 7.2 g/dL (ref 6.0–8.3)
Total Bilirubin: 0.5 mg/dL (ref 0.3–1.2)

## 2013-04-23 LAB — LIPASE, BLOOD: Lipase: 38 U/L (ref 11–59)

## 2013-04-23 MED ORDER — ONDANSETRON HCL 4 MG/2ML IJ SOLN
4.0000 mg | Freq: Once | INTRAMUSCULAR | Status: AC
  Administered 2013-04-23: 4 mg via INTRAVENOUS
  Filled 2013-04-23: qty 2

## 2013-04-23 MED ORDER — SODIUM CHLORIDE 0.9 % IV BOLUS (SEPSIS)
1000.0000 mL | Freq: Once | INTRAVENOUS | Status: AC
  Administered 2013-04-23: 1000 mL via INTRAVENOUS

## 2013-04-23 MED ORDER — HYDRALAZINE HCL 20 MG/ML IJ SOLN
10.0000 mg | Freq: Once | INTRAMUSCULAR | Status: AC
  Administered 2013-04-23: 10 mg via INTRAVENOUS
  Filled 2013-04-23: qty 1

## 2013-04-23 MED ORDER — MORPHINE SULFATE 4 MG/ML IJ SOLN
4.0000 mg | Freq: Once | INTRAMUSCULAR | Status: AC
  Administered 2013-04-23: 4 mg via INTRAVENOUS
  Filled 2013-04-23: qty 1

## 2013-04-23 NOTE — Discharge Instructions (Signed)
Keep a record of your blood pressures over the next several days so that you can take this to your primary Dr. for review.  Return to the ER for severe headache, chest pain, or other new or concerning symptoms.   Arterial Hypertension Arterial hypertension (high blood pressure) is a condition of elevated pressure in your blood vessels. Hypertension over a long period of time is a risk factor for strokes, heart attacks, and heart failure. It is also the leading cause of kidney (renal) failure.  CAUSES   In Adults -- Over 90% of all hypertension has no known cause. This is called essential or primary hypertension. In the other 10% of people with hypertension, the increase in blood pressure is caused by another disorder. This is called secondary hypertension. Important causes of secondary hypertension are:  Heavy alcohol use.  Obstructive sleep apnea.  Hyperaldosterosim (Conn's syndrome).  Steroid use.  Chronic kidney failure.  Hyperparathyroidism.  Medications.  Renal artery stenosis.  Pheochromocytoma.  Cushing's disease.  Coarctation of the aorta.  Scleroderma renal crisis.  Licorice (in excessive amounts).  Drugs (cocaine, methamphetamine). Your caregiver can explain any items above that apply to you.  In Children -- Secondary hypertension is more common and should always be considered.  Pregnancy -- Few women of childbearing age have high blood pressure. However, up to 10% of them develop hypertension of pregnancy. Generally, this will not harm the woman. It may be a sign of 3 complications of pregnancy: preeclampsia, HELLP syndrome, and eclampsia. Follow up and control with medication is necessary. SYMPTOMS   This condition normally does not produce any noticeable symptoms. It is usually found during a routine exam.  Malignant hypertension is a late problem of high blood pressure. It may have the following symptoms:  Headaches.  Blurred vision.  End-organ  damage (this means your kidneys, heart, lungs, and other organs are being damaged).  Stressful situations can increase the blood pressure. If a person with normal blood pressure has their blood pressure go up while being seen by their caregiver, this is often termed "white coat hypertension." Its importance is not known. It may be related with eventually developing hypertension or complications of hypertension.  Hypertension is often confused with mental tension, stress, and anxiety. DIAGNOSIS  The diagnosis is made by 3 separate blood pressure measurements. They are taken at least 1 week apart from each other. If there is organ damage from hypertension, the diagnosis may be made without repeat measurements. Hypertension is usually identified by having blood pressure readings:  Above 140/90 mmHg measured in both arms, at 3 separate times, over a couple weeks.  Over 130/80 mmHg should be considered a risk factor and may require treatment in patients with diabetes. Blood pressure readings over 120/80 mmHg are called "pre-hypertension" even in non-diabetic patients. To get a true blood pressure measurement, use the following guidelines. Be aware of the factors that can alter blood pressure readings.  Take measurements at least 1 hour after caffeine.  Take measurements 30 minutes after smoking and without any stress. This is another reason to quit smoking  it raises your blood pressure.  Use a proper cuff size. Ask your caregiver if you are not sure about your cuff size.  Most home blood pressure cuffs are automatic. They will measure systolic and diastolic pressures. The systolic pressure is the pressure reading at the start of sounds. Diastolic pressure is the pressure at which the sounds disappear. If you are elderly, measure pressures in multiple postures. Try  sitting, lying or standing.  Sit at rest for a minimum of 5 minutes before taking measurements.  You should not be on any medications  like decongestants. These are found in many cold medications.  Record your blood pressure readings and review them with your caregiver. If you have hypertension:  Your caregiver may do tests to be sure you do not have secondary hypertension (see "causes" above).  Your caregiver may also look for signs of metabolic syndrome. This is also called Syndrome X or Insulin Resistance Syndrome. You may have this syndrome if you have type 2 diabetes, abdominal obesity, and abnormal blood lipids in addition to hypertension.  Your caregiver will take your medical and family history and perform a physical exam.  Diagnostic tests may include blood tests (for glucose, cholesterol, potassium, and kidney function), a urinalysis, or an EKG. Other tests may also be necessary depending on your condition. PREVENTION  There are important lifestyle issues that you can adopt to reduce your chance of developing hypertension:  Maintain a normal weight.  Limit the amount of salt (sodium) in your diet.  Exercise often.  Limit alcohol intake.  Get enough potassium in your diet. Discuss specific advice with your caregiver.  Follow a DASH diet (dietary approaches to stop hypertension). This diet is rich in fruits, vegetables, and low-fat dairy products, and avoids certain fats. PROGNOSIS  Essential hypertension cannot be cured. Lifestyle changes and medical treatment can lower blood pressure and reduce complications. The prognosis of secondary hypertension depends on the underlying cause. Many people whose hypertension is controlled with medicine or lifestyle changes can live a normal, healthy life.  RISKS AND COMPLICATIONS  While high blood pressure alone is not an illness, it often requires treatment due to its short- and long-term effects on many organs. Hypertension increases your risk for:  CVAs or strokes (cerebrovascular accident).  Heart failure due to chronically high blood pressure (hypertensive  cardiomyopathy).  Heart attack (myocardial infarction).  Damage to the retina (hypertensive retinopathy).  Kidney failure (hypertensive nephropathy). Your caregiver can explain list items above that apply to you. Treatment of hypertension can significantly reduce the risk of complications. TREATMENT   For overweight patients, weight loss and regular exercise are recommended. Physical fitness lowers blood pressure.  Mild hypertension is usually treated with diet and exercise. A diet rich in fruits and vegetables, fat-free dairy products, and foods low in fat and salt (sodium) can help lower blood pressure. Decreasing salt intake decreases blood pressure in a 1/3 of people.  Stop smoking if you are a smoker. The steps above are highly effective in reducing blood pressure. While these actions are easy to suggest, they are difficult to achieve. Most patients with moderate or severe hypertension end up requiring medications to bring their blood pressure down to a normal level. There are several classes of medications for treatment. Blood pressure pills (antihypertensives) will lower blood pressure by their different actions. Lowering the blood pressure by 10 mmHg may decrease the risk of complications by as much as 25%. The goal of treatment is effective blood pressure control. This will reduce your risk for complications. Your caregiver will help you determine the best treatment for you according to your lifestyle. What is excellent treatment for one person, may not be for you. HOME CARE INSTRUCTIONS   Do not smoke.  Follow the lifestyle changes outlined in the "Prevention" section.  If you are on medications, follow the directions carefully. Blood pressure medications must be taken as prescribed. Skipping doses  reduces their benefit. It also puts you at risk for problems.  Follow up with your caregiver, as directed.  If you are asked to monitor your blood pressure at home, follow the  guidelines in the "Diagnosis" section above. SEEK MEDICAL CARE IF:   You think you are having medication side effects.  You have recurrent headaches or lightheadedness.  You have swelling in your ankles.  You have trouble with your vision. SEEK IMMEDIATE MEDICAL CARE IF:   You have sudden onset of chest pain or pressure, difficulty breathing, or other symptoms of a heart attack.  You have a severe headache.  You have symptoms of a stroke (such as sudden weakness, difficulty speaking, difficulty walking). MAKE SURE YOU:   Understand these instructions.  Will watch your condition.  Will get help right away if you are not doing well or get worse. Document Released: 03/18/2005 Document Revised: 06/10/2011 Document Reviewed: 10/16/2006 [facility] Patient Information 2014 [place], [place].

## 2013-04-23 NOTE — ED Notes (Signed)
Pt reports he vomited 1 hour ago right after taking his medicine. sts he did eat right before. Also c/o HTN at home. sts when he checked his BP 197/107 last night. Pt reports he is also having ringing in the ear for a week, more so in the right ear but sometimes both ears. Denies dizziness/feeling weaker than normal. Reports having some numbness in right thumb, which started after her brain sx in March 2014, no new numbness. Equal hand grips, no facial droop, no slurred speech. Nad, skin warm and dry, resp e/u.

## 2013-04-23 NOTE — ED Provider Notes (Signed)
CSN: [id number]     Arrival date & time 04/23/13  4098 History   First MD Initiated Contact with Patient 04/23/13 [phone]     Chief Complaint  Patient presents with  . Hypertension  . Emesis   (Consider location/radiation/quality/duration/timing/severity/associated sxs/prior Treatment) HPI Comments: Patient presents with elevated blood pressure to 190/100 last night. 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 he states compliance with medications with no recent changes. 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 He denies any chest pain or shortness of breath. He denies any visual changes. complains of a feeling of "fullness" in his right ear it has been intermittent for the past 2 days. He denies any hearing change or ringing in the ears. His ongoing weakness and numbness in his right thumb which is a problem since the surgery. No new weakness, numbness or tingling. Denies any dizziness or lightheadedness.  The history is provided by the patient and the spouse.    Past Medical History  Diagnosis Date  . Hypertension   . Ulcer   . Brain aneurysm   . Epistaxis, recurrent    Past Surgical History  Procedure Laterality Date  . Craniotomy Right 06/14/2012    Procedure: CRANIOTOMY HEMATOMA EVACUATION SUBDURAL;  Surgeon: [name], MD;  Location: [facility];  Service: Neurosurgery;  Laterality: Right;   Family History  Problem Relation Age of Onset  . Hypertension Mother   . Hypertension Father   . Cancer Brother     leukemia  . Renal Disease Brother    History  Substance Use Topics  . Smoking status: Never Smoker   . Smokeless tobacco: Not on file  . Alcohol Use: No    Review of Systems  Constitutional: Negative for fever, activity change and appetite change.  HENT: Negative for tinnitus.   Eyes: Negative for visual disturbance.  Respiratory: Negative for cough, chest tightness and shortness of  breath.   Cardiovascular: Negative for chest pain.  Gastrointestinal: Positive for nausea and vomiting. Negative for abdominal pain.  Endocrine: Negative for polyuria.  Genitourinary: Negative for dysuria and hematuria.  Musculoskeletal: Negative for back pain.  Skin: Negative for rash.  Neurological: Negative for dizziness, tremors, weakness and headaches.  A complete 10 system review of systems was obtained and all systems are negative except as noted in the HPI and PMH.    Allergies  Review of patient's allergies indicates no known allergies.  Home Medications   Current Outpatient Rx  Name  Route  Sig  Dispense  Refill  . acetaminophen (TYLENOL) 500 MG tablet   Oral   Take 500 mg by mouth every 6 (six) hours as needed for pain.         Marland Kitchen amLODipine (NORVASC) 10 MG tablet   Oral   Take 1 tablet (10 mg total) by mouth daily.   30 tablet   1   . atenolol (TENORMIN) 25 MG tablet   Oral   Take 12.5 mg by mouth daily.         . carbamazepine (TEGRETOL XR) 200 MG 12 hr tablet   Oral   Take 1 tablet (200 mg total) by mouth 2 (two) times daily.   60 tablet   1   . hydrALAZINE (APRESOLINE) 25 MG tablet   Oral   Take 12.5 mg by mouth 4 (four) times daily.         Marland Kitchen lisinopril (PRINIVIL,ZESTRIL) 40 MG tablet  Oral   Take 40 mg by mouth daily.          . Multiple Vitamins-Minerals (MULTIVITAMIN) tablet   Oral   Take 1 tablet by mouth daily.          BP 178/98  Pulse 84  Temp(Src) 98.5 F (36.9 C) (Oral)  Resp 16  SpO2 100% Physical Exam  Constitutional: He appears well-developed and well-nourished. No distress.  HENT:  Head: Normocephalic and atraumatic.  Mouth/Throat: Oropharynx is clear and moist. No oropharyngeal exudate.  Eyes: Conjunctivae and EOM are normal. Pupils are equal, round, and reactive to light.  Neck: Normal range of motion. Neck supple.  No meningismus  Cardiovascular: Normal rate, regular rhythm and normal heart sounds.   No murmur  heard. Pulmonary/Chest: Effort normal and breath sounds normal. No respiratory distress.  Abdominal: Soft. There is no tenderness. There is no rebound and no guarding.  Musculoskeletal: Normal range of motion. He exhibits no edema and no tenderness.  Neurological: He is alert. No cranial nerve deficit. He exhibits normal muscle tone. Coordination normal.  CN 2-12 intact, no ataxia on finger to nose, no nystagmus, 5/5 strength throughout, no pronator drift, Romberg negative, normal gait.   Skin: Skin is warm.    ED Course  Procedures (including critical care time) Labs Review Labs Reviewed  CBC WITH DIFFERENTIAL - Abnormal; Notable for the following:    WBC 3.6 (*)    MCHC 36.1 (*)    All other components within normal limits  COMPREHENSIVE METABOLIC PANEL - Abnormal; Notable for the following:    GFR calc non Af Amer 89 (*)    All other components within normal limits  TROPONIN I  LIPASE, BLOOD   Imaging Review Ct Head Wo Contrast  04/23/2013   CLINICAL DATA:  Hypertension with vision abnormality acutely  EXAM: CT HEAD WITHOUT CONTRAST  TECHNIQUE: Contiguous axial images were obtained from the base of the skull through the vertex without intravenous contrast. Study was obtained within 24 hr of patient's arrival at the emergency department.  COMPARISON:  March 09, 2013  FINDINGS: The ventricles are normal in size and configuration. There is no mass, hemorrhage, extra-axial fluid collection, or midline shift. There is evidence of prior infarct at the right posterior temporal -posterior occipital -anterior to mid parietal junction with encephalomalacia, stable. There is mild small vessel disease in the anterior centra semiovale bilaterally, stable. There is no acute appearing infarct. No new gray-white compartment lesion.  Patient has had a previous right parietal craniotomy. Bony calvarium otherwise appears intact. Mastoid air cells are clear. There is mild ethmoid sinus disease  bilaterally.  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 There is small vessel disease in the anterior centra semiovale bilaterally, stable. There is evidence of previous craniotomy on the right, stable. There is no hemorrhage, intra-axial mass, or acute appearing infarct. There is mild ethmoid sinus disease bilaterally.   Electronically Signed   By: Bretta BangWilliam  Woodruff M.D.   On: 04/23/2013 11:34   Mr Brain Wo Contrast  04/23/2013   CLINICAL DATA:  Malignant hypertension.  Vomiting.  EXAM: MRI HEAD WITHOUT CONTRAST  TECHNIQUE: Multiplanar, multiecho pulse sequences of the brain and surrounding structures were obtained without intravenous contrast.  COMPARISON:  CT head 04/23/2013  FINDINGS: Right parietal craniotomy. Encephalomalacia is present in the right temporoparietal lobe with volume loss and evidence of chronic hemorrhage.  Moderate  chronic microvascular ischemic changes in the white matter. 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 Small chronic lacune in the right pons.  Negative for acute infarct.  Advanced chronic micro hemorrhage throughout the brain with numerous small foci of iron deposition. Large area of chronic hemorrhage in the right temporoparietal lobe with evidence of prior craniotomy.  No evidence of neoplasm or mass.  IMPRESSION: Chronic microvascular ischemia.  No acute infarct.  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 No acute hemorrhage is seen on the CT earlier today.   Electronically Signed   By: Marlan Palau M.D.   On: 04/23/2013 16:43    EKG Interpretation   None       MDM   1. Hypertension    Hypertension with history of same that has been difficult to control.  Denies headache, vision change, chest pain,  shortness of breath.  States compliance with meds. Vomiting after taking meds at home.  BP 167/100 on arrival.  Neuro exam intact. CT obtained given history of ICH, though denies headache today. CT with chronic encephalomelacia.  No hemorrhage.  GIven home BP meds in ED from home supply. Numbness in R thumb is stable since march 2014.   "fullness" in R ear intermittently for past week, no hearing change or tinnitus.   BP controlled in the ED with diastolic <100. MRI pending at time of sign out to Dr. Judd Lien to evaluate for new stroke.  BP 178/98  Pulse 84  Temp(Src) 98.5 F (36.9 C) (Oral)  Resp 16  SpO2 100%    Glynn Octave, MD 04/23/13 [phone]

## 2013-04-23 NOTE — ED Provider Notes (Signed)
Care assumed from Dr. Manus Gunningancour at shift change awaiting MRI results. 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 He was evaluated today for elevated blood pressure and noise in his ear. Workup reveals an unchanged head CT and unremarkable laboratory studies. When I took over care, patient was awaiting results of an MRI. This was performed and revealed no acute intracranial hemorrhage or other acute abnormality. It did show chronic changes associated with prior hemorrhage. His blood pressure has improved and at this point I feel as though he is appropriate for discharge. He has been advised to follow his blood pressures and keep her of them for his followup appointment he has upcoming in the next week.  Luis Lyonsouglas Juanjesus Pepperman, MD 04/23/13 1700

## 2013-04-23 NOTE — ED Notes (Signed)
Contacted MRI; stated that he would be next in line (approximately 20-30 minutes)

## 2013-06-07 ENCOUNTER — Emergency Department (HOSPITAL_COMMUNITY)
Admission: EM | Admit: 2013-06-07 | Discharge: 2013-06-07 | Disposition: A | Discharge status: Home / Self Care | Payer: 59 | Attending: Emergency Medicine | Admitting: Emergency Medicine

## 2013-06-07 ENCOUNTER — Emergency Department (HOSPITAL_COMMUNITY): Payer: 59

## 2013-06-07 ENCOUNTER — Encounter (HOSPITAL_COMMUNITY): Payer: Self-pay | Admitting: Emergency Medicine

## 2013-06-07 DIAGNOSIS — Z872 Personal history of diseases of the skin and subcutaneous tissue: Secondary | ICD-10-CM | POA: Insufficient documentation

## 2013-06-07 DIAGNOSIS — R112 Nausea with vomiting, unspecified: Secondary | ICD-10-CM | POA: Insufficient documentation

## 2013-06-07 DIAGNOSIS — Z79899 Other long term (current) drug therapy: Secondary | ICD-10-CM | POA: Insufficient documentation

## 2013-06-07 DIAGNOSIS — Z8669 Personal history of other diseases of the nervous system and sense organs: Secondary | ICD-10-CM | POA: Insufficient documentation

## 2013-06-07 DIAGNOSIS — I10 Essential (primary) hypertension: Secondary | ICD-10-CM

## 2013-06-07 MED ORDER — LISINOPRIL 20 MG PO TABS
80.0000 mg | ORAL_TABLET | Freq: Once | ORAL | Status: AC
  Administered 2013-06-07: 80 mg via ORAL
  Filled 2013-06-07: qty 4

## 2013-06-07 NOTE — ED Notes (Signed)
PA at bedside.

## 2013-06-07 NOTE — ED Provider Notes (Signed)
CSN: [id number]     Arrival date & time 06/07/13  1329 History   First MD Initiated Contact with Patient 06/07/13 1502     Chief Complaint  Patient presents with  . Headache     (Consider location/radiation/quality/duration/timing/severity/associated sxs/prior Treatment) HPI Comments: Pt has had intermittent N/V which he has had in the past in review of prior records, denies CP, abd pain, back pain, and has eaten today with no nausea.  Pt stopped taking some of his BP medications because he felt that some of his meds might be causing his HA's.  Pt has not had a change in his dose or his meds in many months.    Patient is a 58 y.o. male presenting with headaches. The history is provided by the patient and medical records.  Headache Pain location:  R temporal Radiates to:  Does not radiate Duration:  5 days Timing:  Sporadic Progression:  Resolved Similar to prior headaches: yes   Relieved by:  Nothing Worsened by:  Nothing tried Ineffective treatments:  None tried Associated symptoms: nausea and vomiting   Associated symptoms: no cough, no diarrhea and no dizziness     Past Medical History  Diagnosis Date  . Hypertension   . Ulcer   . Brain aneurysm   . Epistaxis, recurrent    Past Surgical History  Procedure Laterality Date  . Craniotomy Right 06/14/2012    Procedure: CRANIOTOMY HEMATOMA EVACUATION SUBDURAL;  Surgeon: [name], MD;  Location: [facility];  Service: Neurosurgery;  Laterality: Right;   Family History  Problem Relation Age of Onset  . Hypertension Mother   . Hypertension Father   . Cancer Brother     leukemia  . Renal Disease Brother    History  Substance Use Topics  . Smoking status: Never Smoker   . Smokeless tobacco: Not on file  . Alcohol Use: No    Review of Systems  Eyes: Negative for visual disturbance.  Respiratory: Negative for cough.   Gastrointestinal: Positive for nausea and vomiting. Negative for diarrhea.  Neurological:  Positive for headaches. Negative for dizziness, speech difficulty and weakness.  All other systems reviewed and are negative.      Allergies  Review of patient's allergies indicates no known allergies.  Home Medications   Current Outpatient Rx  Name  Route  Sig  Dispense  Refill  . acetaminophen (TYLENOL) 500 MG tablet   Oral   Take 500 mg by mouth every 6 (six) hours as needed for pain.         Marland Kitchen amLODipine (NORVASC) 10 MG tablet   Oral   Take 10 mg by mouth daily.         Marland Kitchen atenolol (TENORMIN) 50 MG tablet   Oral   Take 25 mg by mouth daily.         . carbamazepine (TEGRETOL XR) 200 MG 12 hr tablet   Oral   Take 200 mg by mouth 2 (two) times daily.         . hydrALAZINE (APRESOLINE) 25 MG tablet   Oral   Take 12.5 mg by mouth 4 (four) times daily.         Marland Kitchen lisinopril (PRINIVIL,ZESTRIL) 40 MG tablet   Oral   Take 80 mg by mouth 2 (two) times daily.          . Multiple Vitamins-Minerals (MULTIVITAMIN) tablet   Oral   Take 0.5 tablets by mouth daily.  BP 173/105  Pulse 67  Temp(Src) 98.3 F (36.8 C)  Resp 28  SpO2 100% Physical Exam  Nursing note and vitals reviewed. Constitutional: He is oriented to person, place, and time. He appears well-developed and well-nourished. No distress.  HENT:  Head: Normocephalic and atraumatic.  Eyes: Conjunctivae and EOM are normal. No scleral icterus.  Neck: Normal range of motion. Neck supple.  Cardiovascular: Normal rate, regular rhythm and intact distal pulses.   Pulmonary/Chest: Effort normal.  Abdominal: Soft. He exhibits no distension. There is no tenderness.  Neurological: He is alert and oriented to person, place, and time. No cranial nerve deficit. He exhibits normal muscle tone. Coordination normal.  Skin: Skin is warm. He is not diaphoretic.    ED Course  Procedures (including critical care time) Labs Review Labs Reviewed - No data to display Imaging Review Ct Head Wo  Contrast  06/07/2013   CLINICAL DATA:  Neurological changes  EXAM: CT HEAD WITHOUT CONTRAST  TECHNIQUE: Contiguous axial images were obtained from the base of the skull through the vertex without intravenous contrast.  COMPARISON:  MR HEAD W/O CM dated 04/23/2013; CT HEAD W/O CM dated 04/23/2013  FINDINGS: There is encephalomalacia in the right parietal and temporal lobes. Chronic ischemic changes in the periventricular white matter and bilateral basal ganglia. No mass effect, midline shift, or acute intracranial hemorrhage. Status post right parietal craniotomy. Cranium is otherwise intact. Mastoid air cells are clear.  IMPRESSION: No acute intracranial pathology. Postoperative and chronic ischemic changes are noted.   Electronically Signed   By: Maryclare BeanArt  Hoss M.D.   On: 06/07/2013 15:57     EKG Interpretation None     RA sat is 100% and I interpret to be normal  4:49 PM Head CT shows no hemorrhage, no change from prior imaging in January. Reiterated to pt to be compliant and pt has follow up tomorrow with PCP and can furtehr discuss his HTN meds.  Pt encouraged to take tylenol for HA if it happens again.  No Nausea currently.      MDM   Final diagnoses:  Hypertension    Pt without focal neuro deifict, will get head CT as he still is HTN, but I suspect it is because he hasn't taken his usual meds.  Pt encouraged strongly that he must be compliant with taking his medications for BP control that his risk for bleeding is greater if he doesn't.      Gavin PoundMichael Y. Redell Bhandari, MD 06/07/13 1650

## 2013-06-07 NOTE — ED Notes (Signed)
Patient transported to CT 

## 2013-06-07 NOTE — ED Notes (Addendum)
Pt complaining of screwing pain sensation in the temporal region for past few days after taking BP meds. sts also frontal HA. sts vomiting and he believes associated with taking BP meds. Pt recent brain surgery. Pt denies pain currently. sts he didn't take any of his meds this am.

## 2013-06-07 NOTE — Discharge Instructions (Signed)
Arterial Hypertension °Arterial hypertension (high blood pressure) is a condition of elevated pressure in your blood vessels. Hypertension over a long period of time is a risk factor for strokes, heart attacks, and heart failure. It is also the leading cause of kidney (renal) failure.  °CAUSES  °· In Adults -- Over 90% of all hypertension has no known cause. This is called essential or primary hypertension. In the other 10% of people with hypertension, the increase in blood pressure is caused by another disorder. This is called secondary hypertension. Important causes of secondary hypertension are: °· Heavy alcohol use. °· Obstructive sleep apnea. °· Hyperaldosterosim (Conn's syndrome). °· Steroid use. °· Chronic kidney failure. °· Hyperparathyroidism. °· Medications. °· Renal artery stenosis. °· Pheochromocytoma. °· Cushing's disease. °· Coarctation of the aorta. °· Scleroderma renal crisis. °· Licorice (in excessive amounts). °· Drugs (cocaine, methamphetamine). °Your caregiver can explain any items above that apply to you. °· In Children -- Secondary hypertension is more common and should always be considered. °· Pregnancy -- Few women of childbearing age have high blood pressure. However, up to 10% of them develop hypertension of pregnancy. Generally, this will not harm the woman. It may be a sign of 3 complications of pregnancy: preeclampsia, HELLP syndrome, and eclampsia. Follow up and control with medication is necessary. °SYMPTOMS  °· This condition normally does not produce any noticeable symptoms. It is usually found during a routine exam. °· Malignant hypertension is a late problem of high blood pressure. It may have the following symptoms: °· Headaches. °· Blurred vision. °· End-organ damage (this means your kidneys, heart, lungs, and other organs are being damaged). °· Stressful situations can increase the blood pressure. If a person with normal blood pressure has their blood pressure go up while being  seen by their caregiver, this is often termed "white coat hypertension." Its importance is not known. It may be related with eventually developing hypertension or complications of hypertension. °· Hypertension is often confused with mental tension, stress, and anxiety. °DIAGNOSIS  °The diagnosis is made by 3 separate blood pressure measurements. They are taken at least 1 week apart from each other. If there is organ damage from hypertension, the diagnosis may be made without repeat measurements. °Hypertension is usually identified by having blood pressure readings: °· Above 140/90 mmHg measured in both arms, at 3 separate times, over a couple weeks. °· Over 130/80 mmHg should be considered a risk factor and may require treatment in patients with diabetes. °Blood pressure readings over 120/80 mmHg are called "pre-hypertension" even in non-diabetic patients. °To get a true blood pressure measurement, use the following guidelines. Be aware of the factors that can alter blood pressure readings. °· Take measurements at least 1 hour after caffeine. °· Take measurements 30 minutes after smoking and without any stress. This is another reason to quit smoking  it raises your blood pressure. °· Use a proper cuff size. Ask your caregiver if you are not sure about your cuff size. °· Most home blood pressure cuffs are automatic. They will measure systolic and diastolic pressures. The systolic pressure is the pressure reading at the start of sounds. Diastolic pressure is the pressure at which the sounds disappear. If you are elderly, measure pressures in multiple postures. Try sitting, lying or standing. °· Sit at rest for a minimum of 5 minutes before taking measurements. °· You should not be on any medications like decongestants. These are found in many cold medications. °· Record your blood pressure readings and review   them with your caregiver. °If you have hypertension: °· Your caregiver may do tests to be sure you do not have  secondary hypertension (see "causes" above). °· Your caregiver may also look for signs of metabolic syndrome. This is also called Syndrome X or Insulin Resistance Syndrome. You may have this syndrome if you have type 2 diabetes, abdominal obesity, and abnormal blood lipids in addition to hypertension. °· Your caregiver will take your medical and family history and perform a physical exam. °· Diagnostic tests may include blood tests (for glucose, cholesterol, potassium, and kidney function), a urinalysis, or an EKG. Other tests may also be necessary depending on your condition. °PREVENTION  °There are important lifestyle issues that you can adopt to reduce your chance of developing hypertension: °· Maintain a normal weight. °· Limit the amount of salt (sodium) in your diet. °· Exercise often. °· Limit alcohol intake. °· Get enough potassium in your diet. Discuss specific advice with your caregiver. °· Follow a DASH diet (dietary approaches to stop hypertension). This diet is rich in fruits, vegetables, and low-fat dairy products, and avoids certain fats. °PROGNOSIS  °Essential hypertension cannot be cured. Lifestyle changes and medical treatment can lower blood pressure and reduce complications. The prognosis of secondary hypertension depends on the underlying cause. Many people whose hypertension is controlled with medicine or lifestyle changes can live a normal, healthy life.  °RISKS AND COMPLICATIONS  °While high blood pressure alone is not an illness, it often requires treatment due to its short- and long-term effects on many organs. Hypertension increases your risk for: °· CVAs or strokes (cerebrovascular accident). °· Heart failure due to chronically high blood pressure (hypertensive cardiomyopathy). °· Heart attack (myocardial infarction). °· Damage to the retina (hypertensive retinopathy). °· Kidney failure (hypertensive nephropathy). °Your caregiver can explain list items above that apply to you. Treatment  of hypertension can significantly reduce the risk of complications. °TREATMENT  °· For overweight patients, weight loss and regular exercise are recommended. Physical fitness lowers blood pressure. °· Mild hypertension is usually treated with diet and exercise. A diet rich in fruits and vegetables, fat-free dairy products, and foods low in fat and salt (sodium) can help lower blood pressure. Decreasing salt intake decreases blood pressure in a 1/3 of people. °· Stop smoking if you are a smoker. °The steps above are highly effective in reducing blood pressure. While these actions are easy to suggest, they are difficult to achieve. Most patients with moderate or severe hypertension end up requiring medications to bring their blood pressure down to a normal level. There are several classes of medications for treatment. Blood pressure pills (antihypertensives) will lower blood pressure by their different actions. Lowering the blood pressure by 10 mmHg may decrease the risk of complications by as much as 25%. °The goal of treatment is effective blood pressure control. This will reduce your risk for complications. Your caregiver will help you determine the best treatment for you according to your lifestyle. What is excellent treatment for one person, may not be for you. °HOME CARE INSTRUCTIONS  °· Do not smoke. °· Follow the lifestyle changes outlined in the "Prevention" section. °· If you are on medications, follow the directions carefully. Blood pressure medications must be taken as prescribed. Skipping doses reduces their benefit. It also puts you at risk for problems. °· Follow up with your caregiver, as directed. °· If you are asked to monitor your blood pressure at home, follow the guidelines in the "Diagnosis" section above. °SEEK MEDICAL CARE   IF:  °· You think you are having medication side effects. °· You have recurrent headaches or lightheadedness. °· You have swelling in your ankles. °· You have trouble with  your vision. °SEEK IMMEDIATE MEDICAL CARE IF:  °· You have sudden onset of chest pain or pressure, difficulty breathing, or other symptoms of a heart attack. °· You have a severe headache. °· You have symptoms of a stroke (such as sudden weakness, difficulty speaking, difficulty walking). °MAKE SURE YOU:  °· Understand these instructions. °· Will watch your condition. °· Will get help right away if you are not doing well or get worse. °Document Released: 03/18/2005 Document Revised: 06/10/2011 Document Reviewed: 10/16/2006 °[employer]® Patient Information ©2014 [employer]. ° °

## 2013-06-15 ENCOUNTER — Encounter (HOSPITAL_COMMUNITY): Payer: Self-pay | Admitting: Emergency Medicine

## 2013-06-15 ENCOUNTER — Emergency Department (HOSPITAL_COMMUNITY): Payer: 59

## 2013-06-15 ENCOUNTER — Emergency Department (HOSPITAL_COMMUNITY)
Admission: EM | Admit: 2013-06-15 | Discharge: 2013-06-15 | Disposition: A | Discharge status: Home / Self Care | Payer: 59 | Attending: Emergency Medicine | Admitting: Emergency Medicine

## 2013-06-15 DIAGNOSIS — IMO0001 Reserved for inherently not codable concepts without codable children: Secondary | ICD-10-CM | POA: Insufficient documentation

## 2013-06-15 DIAGNOSIS — M79609 Pain in unspecified limb: Secondary | ICD-10-CM | POA: Insufficient documentation

## 2013-06-15 DIAGNOSIS — I10 Essential (primary) hypertension: Secondary | ICD-10-CM | POA: Insufficient documentation

## 2013-06-15 DIAGNOSIS — Z872 Personal history of diseases of the skin and subcutaneous tissue: Secondary | ICD-10-CM | POA: Insufficient documentation

## 2013-06-15 DIAGNOSIS — Z79899 Other long term (current) drug therapy: Secondary | ICD-10-CM | POA: Insufficient documentation

## 2013-06-15 DIAGNOSIS — M79604 Pain in right leg: Secondary | ICD-10-CM

## 2013-06-15 MED ORDER — OMEPRAZOLE 20 MG PO CPDR: 20.0000 mg | DELAYED_RELEASE_CAPSULE | Freq: Every day | ORAL | Status: DC

## 2013-06-15 MED ORDER — IBUPROFEN 800 MG PO TABS: 800.0000 mg | ORAL_TABLET | Freq: Three times a day (TID) | ORAL | Status: DC

## 2013-06-15 NOTE — ED Provider Notes (Signed)
CSN: [id number]     Arrival date & time 06/15/13  4098 History  This chart was scribed for Mellody Drown, PA working with Lyanne Co, MD by Quintella Reichert, ED Scribe. This patient was seen in room TR11C/TR11C and the patient's care was started at 10:20 AM.   Chief Complaint  Patient presents with  . R leg pain     The history is provided by the patient. No language interpreter was used.    HPI Comments: Luis Oconnor is a 58 y.o. male who presents to the Emergency Department complaining of right lower leg pain that began 2 days ago.  Pt states that on bearing weight and walking he develops pain to his right anterior shin.  He does not have pain at rest.  He reports pain is slightly less severe if he is wearing shoes when he stands.  He used Icy-Hot on the area which he states worsened his pain.  He denies weakness, numbness, tingling, or lower back pain.  He denies recent fall, injury, or unusual activity.  He denies pain in his left leg.  Pt walks with a cane and walker at baseline.  He denies personal or family h/o DVT/PE.  He denies recent travel.  Pt had a craniotomy in March 2014 but denies any more recent surgeries.  Remote history of PUD.  PCP is August Saucer, ERIC, MD     Past Medical History  Diagnosis Date  . Hypertension   . Ulcer   . Brain aneurysm   . Epistaxis, recurrent     Past Surgical History  Procedure Laterality Date  . Craniotomy Right 06/14/2012    Procedure: CRANIOTOMY HEMATOMA EVACUATION SUBDURAL;  Surgeon: [name], MD;  Location: [facility];  Service: Neurosurgery;  Laterality: Right;    Family History  Problem Relation Age of Onset  . Hypertension Mother   . Hypertension Father   . Cancer Brother     leukemia  . Renal Disease Brother     History  Substance Use Topics  . Smoking status: Never Smoker   . Smokeless tobacco: Not on file  . Alcohol Use: No     Review of Systems  Musculoskeletal: Positive for myalgias. Negative for back  pain.  Neurological: Negative for weakness and numbness.      Allergies  Review of patient's allergies indicates no known allergies.  Home Medications   Current Outpatient Rx  Name  Route  Sig  Dispense  Refill  . acetaminophen (TYLENOL) 500 MG tablet   Oral   Take 500 mg by mouth every 6 (six) hours as needed for pain.         Marland Kitchen amLODipine (NORVASC) 10 MG tablet   Oral   Take 10 mg by mouth daily.         Marland Kitchen atenolol (TENORMIN) 50 MG tablet   Oral   Take 25 mg by mouth daily at 12 noon.          . carbamazepine (TEGRETOL XR) 200 MG 12 hr tablet   Oral   Take 200 mg by mouth 2 (two) times daily.         . hydrALAZINE (APRESOLINE) 25 MG tablet   Oral   Take 12.5 mg by mouth 4 (four) times daily.         Marland Kitchen lisinopril (PRINIVIL,ZESTRIL) 40 MG tablet   Oral   Take 40 mg by mouth 2 (two) times daily.          Marland Kitchen  Menthol, Topical Analgesic, (ICY HOT EX)   Apply externally   Apply 1 application topically as needed (for right leg pain).         . Multiple Vitamins-Minerals (MULTIVITAMIN) tablet   Oral   Take 0.5 tablets by mouth daily.          BP 155/91  Pulse 68  Temp(Src) 98 F (36.7 C) (Oral)  Resp 20  Ht 5\' 6"  (1.676 m)  Wt 143 lb (64.864 kg)  BMI 23.09 kg/m2  SpO2 100%  Physical Exam  Nursing note and vitals reviewed. Constitutional: He is oriented to person, place, and time. He appears well-developed and well-nourished. No distress.  HENT:  Head: Normocephalic and atraumatic.  Eyes: EOM are normal.  Neck: Neck supple.  Cardiovascular: Normal rate and regular rhythm.   Pulses:      Dorsalis pedis pulses are 2+ on the right side.       Posterior tibial pulses are 2+ on the right side.  Pulmonary/Chest: Effort normal. No respiratory distress.  Musculoskeletal: Normal range of motion.       Right ankle: He exhibits normal range of motion, no ecchymosis, no deformity and normal pulse. Achilles tendon exhibits no pain and normal Thompson's  test results.       Feet:  Mild tenderness with palpation over the left lateral lower extremity.   Neurological: He is alert and oriented to person, place, and time. No sensory deficit. He exhibits normal muscle tone.  Reflex Scores:      Achilles reflexes are 2+ on the right side and 2+ on the left side. Skin: Skin is warm and dry.  Psychiatric: He has a normal mood and affect. His behavior is normal.    ED Course  Procedures (including critical care time)  COORDINATION OF CARE: 10:29 AM-Discussed treatment plan which includes pain medication and ice with pt at bedside and pt agreed to plan.    Labs Review Labs Reviewed - No data to display   Imaging Review Dg Tibia/fibula Right  06/15/2013   CLINICAL DATA:  Right lateral distal leg pain, no injury  EXAM: RIGHT TIBIA AND FIBULA - 2 VIEW  COMPARISON:  None.  FINDINGS: No acute fracture is seen. Alignment is normal. There are degenerative changes noted in the right knee and right ankle joint. Arterial calcifications are noted in the soft tissues of the ankle.  IMPRESSION: No acute abnormality. Degenerative change in the right knee and right ankle.   Electronically Signed   By: Dwyane DeePaul  Barry M.D.   On: 06/15/2013 09:26     EKG Interpretation None      MDM   Final diagnoses:  Right leg pain   Pt with non-traumatic injury, NV intact, full Active ROM.  No concern for infection.  Able to ambulate with home walker or cane. Discussed imaging results, and treatment plan with the patient. Return precautions given. Reports understanding and no other concerns at this time.  Patient is stable for discharge at this time. Able to ambulate with personal cane in the ED  Meds given in ED:  Medications - No data to display  New Prescriptions   IBUPROFEN (ADVIL,MOTRIN) 800 MG TABLET    Take 1 tablet (800 mg total) by mouth 3 (three) times daily. Take with food.  Take only for one week.   OMEPRAZOLE (PRILOSEC) 20 MG CAPSULE    Take 1 capsule  (20 mg total) by mouth daily.     I personally performed the services described in  this documentation, which was scribed in my presence. The recorded information has been reviewed and is accurate.     Clabe Seal, PA-C 06/15/13 1044  Leotis Shames Doretha Imus, PA-C 06/15/13 1047

## 2013-06-15 NOTE — Discharge Instructions (Signed)
Call for a follow up appointment with Dr. August Saucerean. Return if Symptoms worsen.   Take Ibuprofen for only 1 week with the Prilosec. Ice your leg 3-4 times a day for 2 days. You may use warm compresses and Ice after the first two days. Elevate your leg above your heart when you are not walking. Use your walker or cane as needed for aid while walking.

## 2013-06-15 NOTE — ED Notes (Signed)
Patient states he walks with a cane and walker.   Patient states in the last 2 days he started having pain in R lower leg.   Patient states his calf doesn't hurt at all.   Patient denies injury.  Patient states has been using topical pain relievers at home.

## 2013-06-17 NOTE — ED Provider Notes (Signed)
Medical screening examination/treatment/procedure(s) were performed by non-physician practitioner and as supervising physician I was immediately available for consultation/collaboration.    Vida RollerBrian D Saragrace Selke, MD 06/17/13 [phone]

## 2013-09-29 ENCOUNTER — Encounter (INDEPENDENT_AMBULATORY_CARE_PROVIDER_SITE_OTHER): Payer: Self-pay | Admitting: General Surgery

## 2013-09-29 ENCOUNTER — Ambulatory Visit (INDEPENDENT_AMBULATORY_CARE_PROVIDER_SITE_OTHER): Payer: 59 | Admitting: General Surgery

## 2013-09-29 ENCOUNTER — Encounter (INDEPENDENT_AMBULATORY_CARE_PROVIDER_SITE_OTHER): Payer: Self-pay

## 2013-09-29 VITALS — BP 142/84 | HR 52 | Temp 97.5°F | Ht 66.0 in | Wt 134.0 lb

## 2013-09-29 DIAGNOSIS — K4090 Unilateral inguinal hernia, without obstruction or gangrene, not specified as recurrent: Secondary | ICD-10-CM

## 2013-09-29 NOTE — Progress Notes (Signed)
Patient ID: Luis Oconnor, male   DOB: 01/05/1956, 58 y.o.   MRN: [id number]  Chief Complaint  Patient presents with  . eval hernia    HPI Luis Oconnor is a 58 y.o. male.  The patient is a 58 year old male who is referred by Dr. Bary RichardE. Dean for evaluation of a right inguinal hernia. Patient states this is been here for approximately 6 months. He states he has a discomfort in that area. He's never had any incarceration or strain patient.  Of note the patient's had previous brain aneurysm which required craniotomy and hematoma evacuation 2014. The patient currently is on been followed by Dr. August Saucerean for this.  HPI  Past Medical History  Diagnosis Date  . Hypertension   . Ulcer   . Brain aneurysm   . Epistaxis, recurrent     Past Surgical History  Procedure Laterality Date  . Craniotomy Right 06/14/2012    Procedure: CRANIOTOMY HEMATOMA EVACUATION SUBDURAL;  Surgeon: Reinaldo Meekerandy O Kritzer, MD;  Location: [facility];  Service: Neurosurgery;  Laterality: Right;    Family History  Problem Relation Age of Onset  . Hypertension Mother   . Hypertension Father   . Cancer Brother     leukemia  . Renal Disease Brother     Social History History  Substance Use Topics  . Smoking status: Never Smoker   . Smokeless tobacco: Not on file  . Alcohol Use: No    No Known Allergies  Current Outpatient Prescriptions  Medication Sig Dispense Refill  . acetaminophen (TYLENOL) 500 MG tablet Take 500 mg by mouth every 6 (six) hours as needed for pain.      Marland Kitchen. amLODipine (NORVASC) 10 MG tablet Take 10 mg by mouth daily.      Marland Kitchen. atenolol (TENORMIN) 50 MG tablet Take 25 mg by mouth daily at 12 noon.       . carbamazepine (TEGRETOL XR) 200 MG 12 hr tablet Take 200 mg by mouth 2 (two) times daily.      . hydrALAZINE (APRESOLINE) 25 MG tablet Take 12.5 mg by mouth 4 (four) times daily.      Marland Kitchen. ibuprofen (ADVIL,MOTRIN) 800 MG tablet Take 1 tablet (800 mg total) by mouth 3 (three) times daily. Take with food.  Take  only for one week.  21 tablet  0  . lisinopril (PRINIVIL,ZESTRIL) 40 MG tablet Take 40 mg by mouth 2 (two) times daily.       . Menthol, Topical Analgesic, (ICY HOT EX) Apply 1 application topically as needed (for right leg pain).      . Multiple Vitamins-Minerals (MULTIVITAMIN) tablet Take 0.5 tablets by mouth daily.      Marland Kitchen. omeprazole (PRILOSEC) 20 MG capsule Take 1 capsule (20 mg total) by mouth daily.  30 capsule  0   No current facility-administered medications for this visit.    Review of Systems Review of Systems  Constitutional: Negative.   HENT: Negative.   Eyes: Negative.   Respiratory: Negative.   Cardiovascular: Negative.   Gastrointestinal: Negative.   Endocrine: Negative.   Neurological: Negative.     Blood pressure 142/84, pulse 52, temperature 97.5 F (36.4 C), height 5\' 6"  (1.676 m), weight 134 lb (60.782 kg).  Physical Exam Physical Exam  Constitutional: He is oriented to person, place, and time. He appears well-developed and well-nourished.  HENT:  Head: Normocephalic and atraumatic.  Eyes: Conjunctivae and EOM are normal. Pupils are equal, round, and reactive to light.  Neck: Normal range of motion.  Neck supple.  Cardiovascular: Normal rate, regular rhythm and normal heart sounds.   Pulmonary/Chest: Effort normal and breath sounds normal.  Abdominal: A hernia is present. Hernia confirmed positive in the right inguinal area. Hernia confirmed negative in the left inguinal area.  Musculoskeletal: Normal range of motion.  Neurological: He is alert and oriented to person, place, and time.  Skin: Skin is warm and dry.    Data Reviewed none  Assessment    58 year old male with a right inguinal hernia, likely indirect. Patient with previous history of hypertension as well as a brain aneurysm, status post craniotomy     Plan    1. We'll get medical clearance from Dr. Bary RichardE Dean and prior to scheduling surgery. 2. Once we obtain medical clearance we'll proceed  with a laparoscopic right inguinal hernia repair with mesh. 3. All risks and benefits were discussed with the patient, to generally include infection, bleeding, damage to surrounding structures, acute and chronic nerve pain, and recurrence. Alternatives were offered and described.  All questions were answered and the patient voiced understanding of the procedure and wishes to proceed at this point.         Marigene Ehlersamirez Jr., Natalya Domzalski 09/29/2013, 11:22 AM

## 2013-11-24 IMAGING — CT CT HEAD W/O CM
1 series · 16 of 30 positions shown, 20 images · non-contrast
Comparison: 06/23/2012

CLINICAL DATA: Previous craniotomy.  Worsening hypertension.

CT HEAD WITHOUT CONTRAST
TECHNIQUE: Contiguous axial images were obtained from the base of
the skull through the vertex without contrast.

[Series 2: head trauma 4.8 h37s · axial · 0.44mm/px · z∈[-182,-54]mm · 16 of 30 slices shown, 20 images]
[im 2/30  brain]
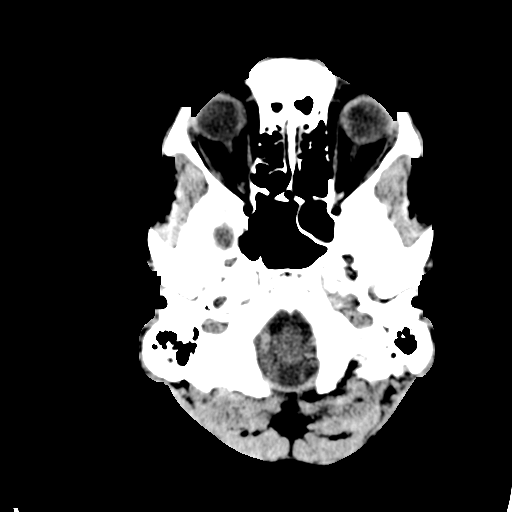
[im 2/30  bone]
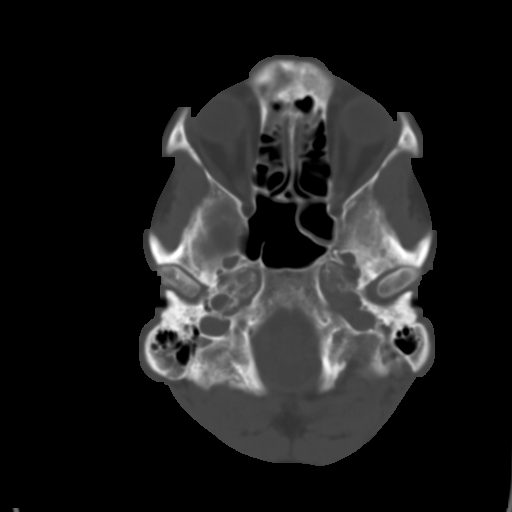
[im 4/30  brain]
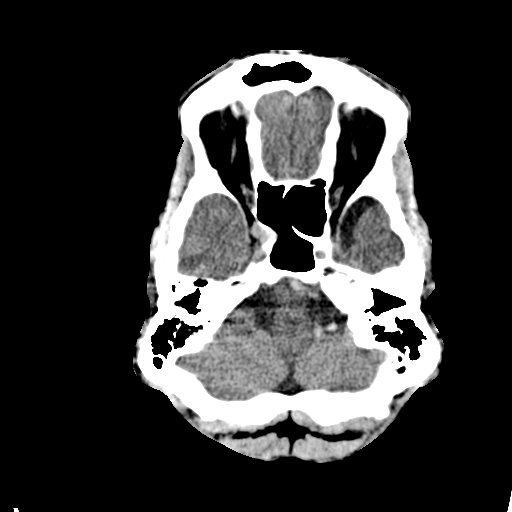
[im 6/30  brain]
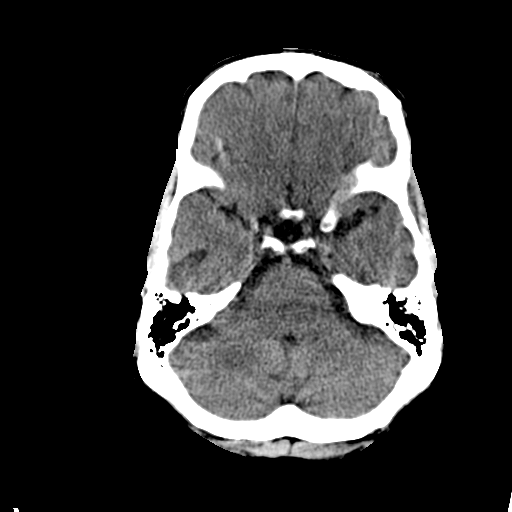
[im 8/30  brain]
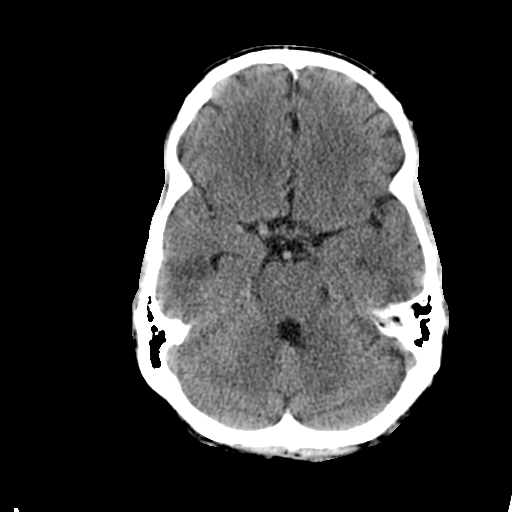
[im 9/30  brain]
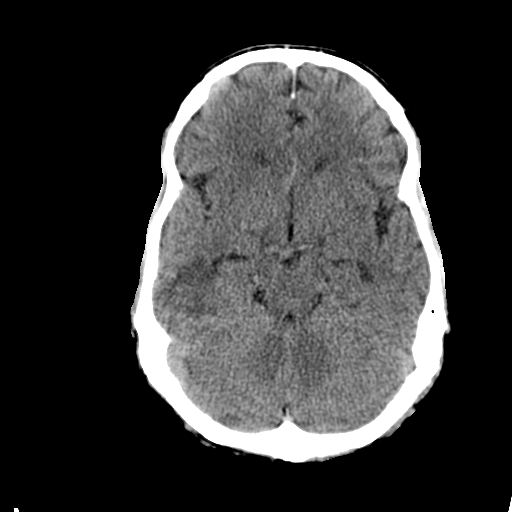
[im 9/30  bone]
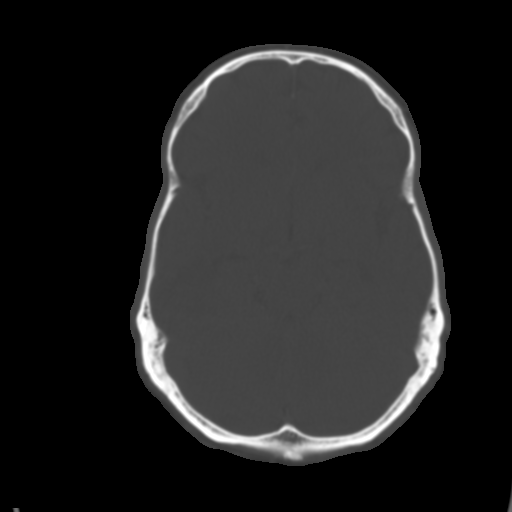
[im 11/30  brain]
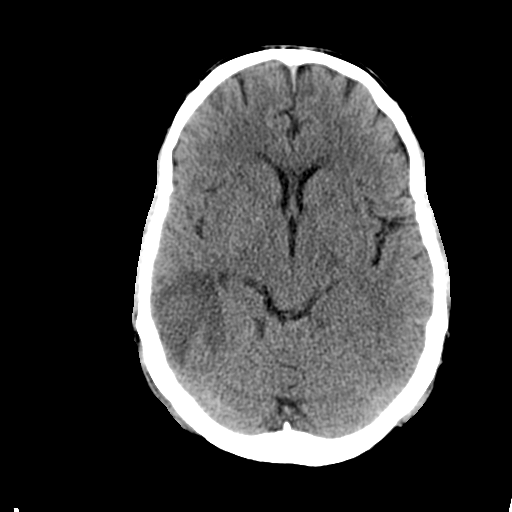
[im 13/30  brain]
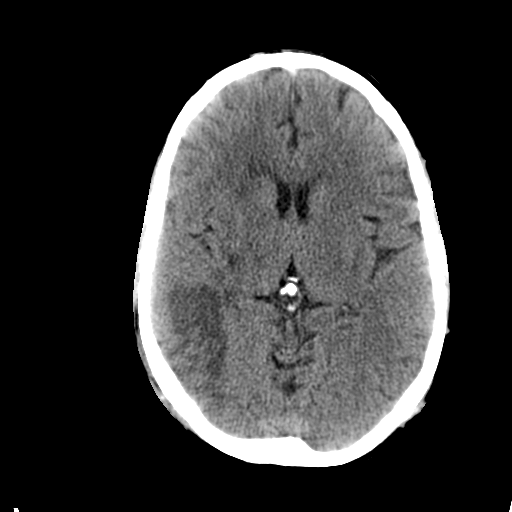
[im 15/30  brain]
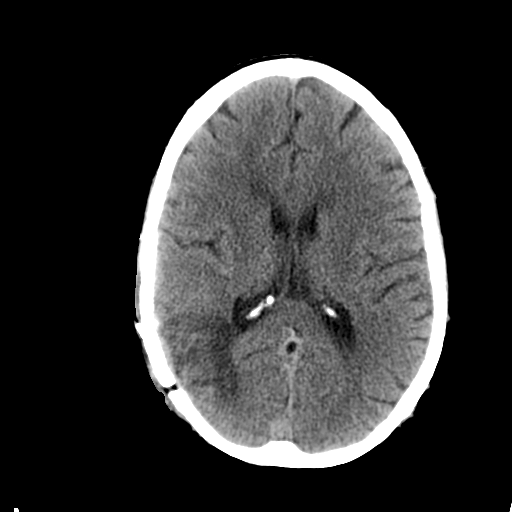
[im 16/30  brain]
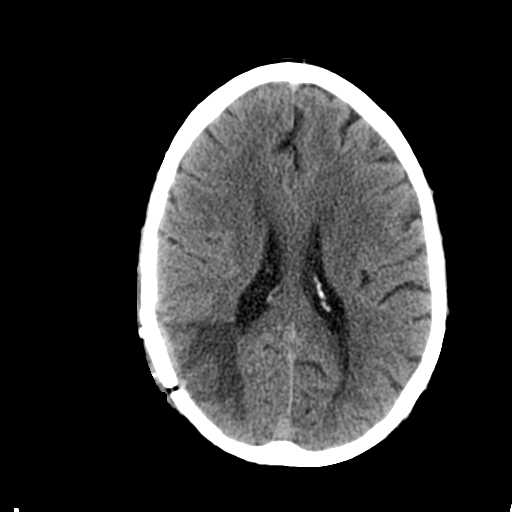
[im 16/30  bone]
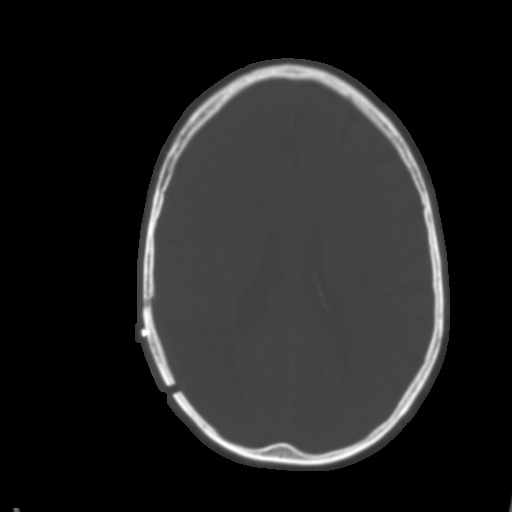
[im 18/30  brain]
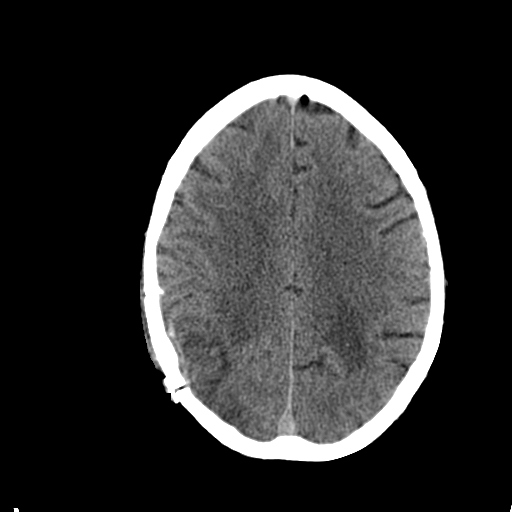
[im 20/30  brain]
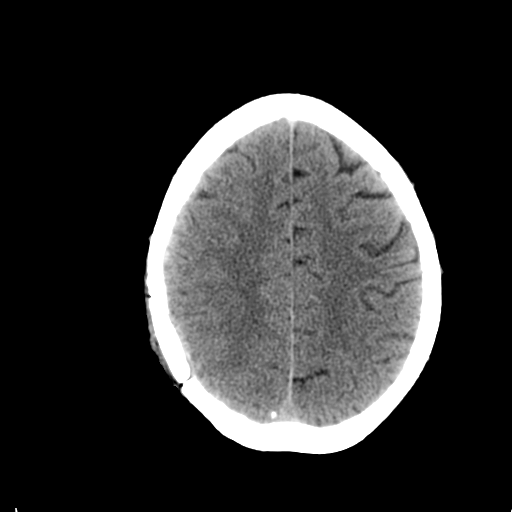
[im 22/30  brain]
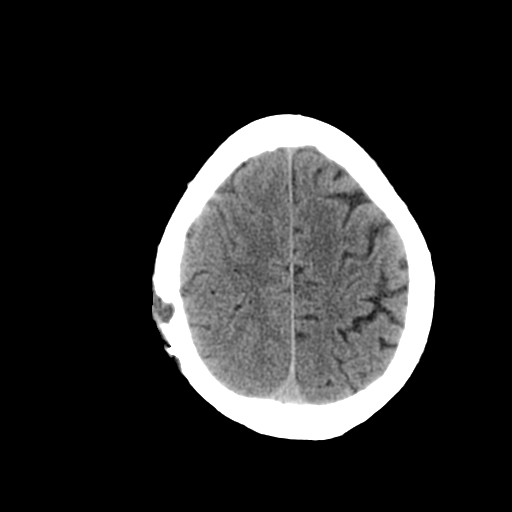
[im 23/30  brain]
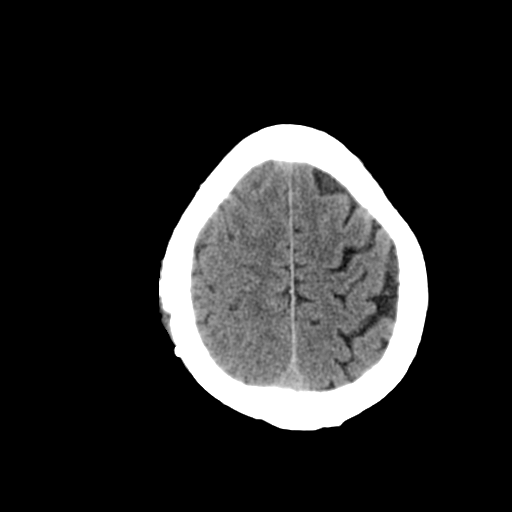
[im 23/30  bone]
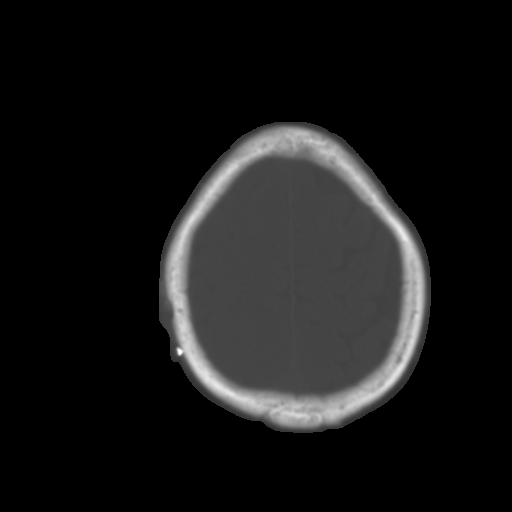
[im 25/30  brain]
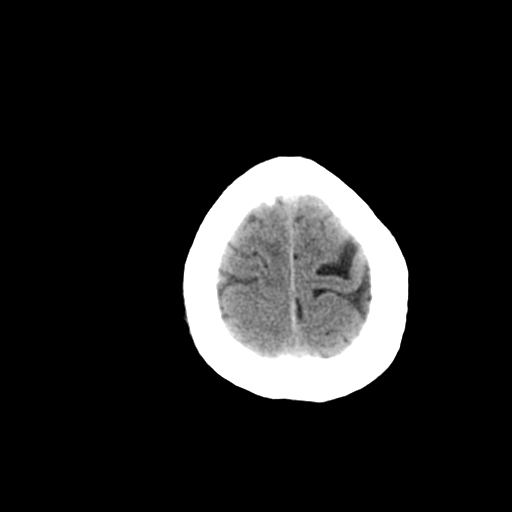
[im 27/30  brain]
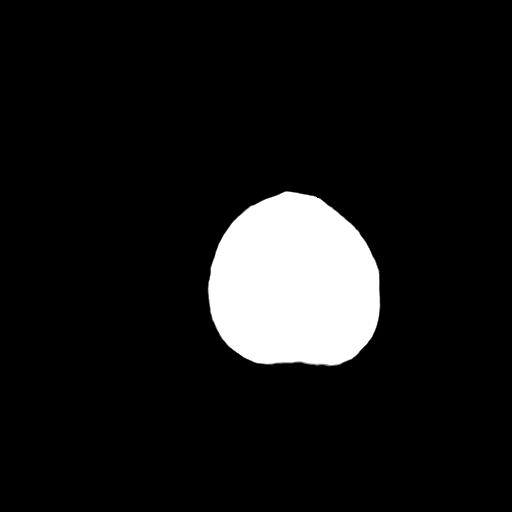
[im 29/30  brain]
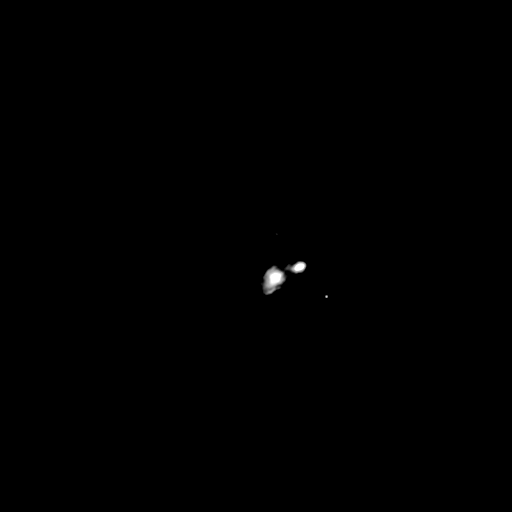

[16 of 30 positions shown; findings below may reference images not displayed]

FINDINGS: The brainstem and cerebellum are unremarkable.  There is
been previous right temporo-parietal craniotomy.  There is low
density in the posterior temporal and parietal region.  Hemorrhage
previously seen in this area is not longer visible.  There is
slightly less brain swelling.  Mild chronic small vessel changes
are seen in the hemispheric white matter elsewhere.  Ventricular
size is stable.  Mild mucosal inflammatory changes effect the
paranasal sinuses.
IMPRESSION: Expected evolutionary changes of an intraparenchymal hemorrhage in
the right temporo-parietal region.  Persistent low density in that
area but no evidence of recurrent bleeding.  No new insult.  Mild
chronic small vessel changes elsewhere.

## 2013-12-17 ENCOUNTER — Encounter (HOSPITAL_COMMUNITY): Payer: Self-pay | Admitting: Emergency Medicine

## 2013-12-17 ENCOUNTER — Emergency Department (HOSPITAL_COMMUNITY)
Admission: EM | Admit: 2013-12-17 | Discharge: 2013-12-17 | Disposition: A | Discharge status: Home / Self Care | Payer: 59 | Attending: Emergency Medicine | Admitting: Emergency Medicine

## 2013-12-17 ENCOUNTER — Emergency Department (HOSPITAL_COMMUNITY): Payer: 59

## 2013-12-17 DIAGNOSIS — R51 Headache: Secondary | ICD-10-CM | POA: Diagnosis present

## 2013-12-17 DIAGNOSIS — G8929 Other chronic pain: Secondary | ICD-10-CM | POA: Diagnosis not present

## 2013-12-17 DIAGNOSIS — Z872 Personal history of diseases of the skin and subcutaneous tissue: Secondary | ICD-10-CM | POA: Diagnosis not present

## 2013-12-17 DIAGNOSIS — I10 Essential (primary) hypertension: Secondary | ICD-10-CM | POA: Insufficient documentation

## 2013-12-17 DIAGNOSIS — Z79899 Other long term (current) drug therapy: Secondary | ICD-10-CM | POA: Diagnosis not present

## 2013-12-17 DIAGNOSIS — R519 Headache, unspecified: Secondary | ICD-10-CM | POA: Diagnosis present

## 2013-12-17 LAB — CBC WITH DIFFERENTIAL/PLATELET
Basophils Absolute: 0.1 10*3/uL (ref 0.0–0.1)
Basophils Relative: 1 % (ref 0–1)
Eosinophils Absolute: 0.1 10*3/uL (ref 0.0–0.7)
Eosinophils Relative: 2 % (ref 0–5)
HEMATOCRIT: 42.4 % (ref 39.0–52.0)
Hemoglobin: 15 g/dL (ref 13.0–17.0)
LYMPHS PCT: 24 % (ref 12–46)
Lymphs Abs: 1.2 10*3/uL (ref 0.7–4.0)
MCH: 29.8 pg (ref 26.0–34.0)
MCHC: 35.4 g/dL (ref 30.0–36.0)
MCV: 84.1 fL (ref 78.0–100.0)
MONO ABS: 0.5 10*3/uL (ref 0.1–1.0)
Monocytes Relative: 9 % (ref 3–12)
Neutro Abs: 3.2 10*3/uL (ref 1.7–7.7)
Neutrophils Relative %: 64 % (ref 43–77)
Platelets: 194 10*3/uL (ref 150–400)
RBC: 5.04 MIL/uL (ref 4.22–5.81)
RDW: 12.3 % (ref 11.5–15.5)
WBC: 5.1 10*3/uL (ref 4.0–10.5)

## 2013-12-17 LAB — I-STAT CHEM 8, ED
BUN: 16 mg/dL (ref 6–23)
CHLORIDE: 102 meq/L (ref 96–112)
CREATININE: 0.9 mg/dL (ref 0.50–1.35)
Calcium, Ion: 1.17 mmol/L (ref 1.13–1.30)
Glucose, Bld: 108 mg/dL — ABNORMAL HIGH (ref 70–99)
HEMATOCRIT: 44 % (ref 39.0–52.0)
HEMOGLOBIN: 15 g/dL (ref 13.0–17.0)
Potassium: 3.5 mEq/L — ABNORMAL LOW (ref 3.7–5.3)
Sodium: 139 mEq/L (ref 137–147)
TCO2: 25 mmol/L (ref 0–100)

## 2013-12-17 MED ORDER — HYDRALAZINE HCL 20 MG/ML IJ SOLN
10.0000 mg | Freq: Once | INTRAMUSCULAR | Status: AC
  Administered 2013-12-17: 10 mg via INTRAVENOUS
  Filled 2013-12-17: qty 1

## 2013-12-17 MED ORDER — FLUTICASONE PROPIONATE 50 MCG/ACT NA SUSP: 2.0000 | Freq: Every day | NASAL | Status: DC

## 2013-12-17 NOTE — ED Provider Notes (Signed)
CSN: [id number]     Arrival date & time 12/17/13  1510 History   First MD Initiated Contact with Patient 12/17/13 2041     Chief Complaint  Patient presents with  . Headache     (Consider location/radiation/quality/duration/timing/severity/associated sxs/prior Treatment) HPI Luis Oconnor 58 y.o. with a pmh of a SDH requiring a craniotomy (in 2010) presents with a headache. Headache was of gradual onset and similar to chronic headaches that have been occurring. It is localized to the right side and is stabbing in character. Worsened with "pressing on the side of my head where the surgery was". Somewhat relieved with tylenol at home. No visual changes. No new injuries. No numbness, tingling, weakness, N/V/D. No dysphagia, dysphonia. Headache is currently minimal in severity. Family is worried about "another bleed".   Past Medical History  Diagnosis Date  . Hypertension   . Ulcer   . Brain aneurysm   . Epistaxis, recurrent    Past Surgical History  Procedure Laterality Date  . Craniotomy Right 06/14/2012    Procedure: CRANIOTOMY HEMATOMA EVACUATION SUBDURAL;  Surgeon: [name], MD;  Location: [facility];  Service: Neurosurgery;  Laterality: Right;   Family History  Problem Relation Age of Onset  . Hypertension Mother   . Hypertension Father   . Cancer Brother     leukemia  . Renal Disease Brother    History  Substance Use Topics  . Smoking status: Never Smoker   . Smokeless tobacco: Not on file  . Alcohol Use: No    Review of Systems  All other systems reviewed and are negative.     Allergies  Review of patient's allergies indicates no known allergies.  Home Medications   Prior to Admission medications   Medication Sig Start Date End Date Taking? Authorizing Provider  acetaminophen (TYLENOL) 500 MG tablet Take 500 mg by mouth every 6 (six) hours as needed for pain.   Yes Historical Provider, MD  amLODipine (NORVASC) 10 MG tablet Take 10 mg by mouth daily.  07/15/12  Yes Toy Baker, MD  atenolol (TENORMIN) 50 MG tablet Take 25 mg by mouth daily at 12 noon.    Yes Historical Provider, MD  carbamazepine (TEGRETOL XR) 200 MG 12 hr tablet Take 200 mg by mouth 2 (two) times daily. 07/03/12  Yes Evlyn Kanner Love, PA-C  hydrALAZINE (APRESOLINE) 25 MG tablet Take 12.5 mg by mouth 4 (four) times daily. 03/29/13  Yes Nathan R. Pickering, MD  lisinopril (PRINIVIL,ZESTRIL) 40 MG tablet Take 40 mg by mouth 2 (two) times daily.    Yes Historical Provider, MD  Menthol, Topical Analgesic, (ICY HOT EX) Apply 1 application topically as needed (for right leg pain).   Yes Historical Provider, MD  Multiple Vitamins-Minerals (MULTIVITAMIN) tablet Take 0.5 tablets by mouth daily. 07/03/12  Yes Evlyn Kanner Love, PA-C  fluticasone (FLONASE) 50 MCG/ACT nasal spray Place 2 sprays into both nostrils daily. 12/17/13 12/20/13  Sena Hitch, MD   BP 147/89  Pulse 65  Temp(Src) 98.1 F (36.7 C) (Oral)  Resp 17  Ht  (1.676 m)  Wt 135 lb (61.236 kg)  BMI 21.80 kg/m2  SpO2 97% Physical Exam  Nursing note and vitals reviewed. Constitutional: He is oriented to person, place, and time. He appears well-developed and well-nourished. No distress.  HENT:  Head: Normocephalic and atraumatic.  Eyes: Conjunctivae and EOM are normal. Right eye exhibits no discharge. Left eye exhibits no discharge.  Neck: Normal range of motion. Neck supple. No  tracheal deviation present.  Cardiovascular: Normal rate, regular rhythm and normal heart sounds.  Exam reveals no friction rub.   No murmur heard. Pulmonary/Chest: Effort normal and breath sounds normal. No stridor. No respiratory distress. He has no wheezes. He has no rales. He exhibits no tenderness.  Abdominal: Soft. He exhibits no distension. There is no tenderness. There is no rebound and no guarding.  Neurological: He is alert and oriented to person, place, and time. No cranial nerve deficit (3-12 grossly itnact bilaterally). GCS eye  subscore is 4. GCS verbal subscore is 5. GCS motor subscore is 6.  Strength in flexion and extension at the shoulders, elbows, wrists, hips, knee, and ankle 5/5 bilaterally EHL 5/5 bilaterally Grip strength 5/5 bilaterally Finger to nose, heel toe shin and rapid alternating movements intact bilaterally. Sensation over the dorsum of the hand over the 1st, second and 5th metacarpal, and the lateral forearm and lateral shoulder intact bilaterally.  Sensation over the medial and lateral malleolus, and over the 1st metatarsal intact bilaterally.   Skin: Skin is warm.  Psychiatric: He has a normal mood and affect.    ED Course  Procedures (including critical care time) Labs Review Labs Reviewed  I-STAT CHEM 8, ED - Abnormal; Notable for the following:    Potassium 3.5 (*)    Glucose, Bld 108 (*)    All other components within normal limits  CBC WITH DIFFERENTIAL    Imaging Review Ct Head Wo Contrast  12/17/2013   CLINICAL DATA:  Two week history of right-sided headache. Prior right temporoparietal intracranial hemorrhage in March, 2014.  EXAM: CT HEAD WITHOUT CONTRAST  TECHNIQUE: Contiguous axial images were obtained from the base of the skull through the vertex without intravenous contrast.  COMPARISON:  Multiple prior head CTs dating back to 06/14/2012, most recently 06/07/2013.  FINDINGS: Encephalomalacia involving the right temporoparietal region at the site of the prior intracranial hemorrhage. This causes ex vacuo enlargement of the right lateral ventricle. Ventricles otherwise normal in size and appearance. No significant atrophy. Moderate changes of small vessel disease of the white matter, unchanged. No mass lesion. No midline shift. No acute hemorrhage or hematoma. No extra-axial fluid collections. No evidence of acute infarction.  Prior right frontotemporal craniotomy. Opacification of ethmoid air cells bilaterally. Small mucous retention cyst or polyp in the right maxillary sinus.  Sphenoid sinuses, frontal sinuses, bilateral mastoid air cells and bilateral middle ear cavities well-aerated.  IMPRESSION: 1. No acute intracranial abnormality. 2. Encephalomalacia involving the right temporoparietal region of the site of the prior intracranial hemorrhage. 3. Stable moderate chronic microvascular ischemic changes of the white matter. 4. 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   Electronically Signed   By: Hulan Saas M.D.   On: 12/17/2013 21:46     EKG Interpretation None      MDM   Final diagnoses:  Chronic intractable headache, unspecified headache type    Pt has headache with non concerning features. No focal deficits on exam. BP was elevated and considered a hypertensive urgency, but decreased somewhat transiently. BP further improved with 10 mg IV hydralazine (patient take hydralazine at home). CT head NAICA. Doubt infectious etiology of his symptoms. Improved with morphine in the ED. Safe and stable to dc home and follow up with his PCP, and may need top fu with a neurologist if HA continue. He was in agreement with the plan. They were concerned about possible sinus allergies. Rx for flonase was given to the patient and instructed  to fu with his PCP for further mgt of possible sinus allergies. Strong return precautions given for worsening symptoms or any other alarming or concerning symptoms or issues. The patient was in agreement with the treatment plan and I answered all of their questions. The patient was stable for dc. At dc, the patient ambulated without difficulty, was moving all four extremities, symptoms improved, NAD. and AOx4. Care discussed with my attending, Dr. Jodi Mourning. If performed and available, imaging studies and labs reviewed.     Sena Hitch, MD 12/18/13 [phone]

## 2013-12-17 NOTE — ED Notes (Signed)
The pt has a rt sided headache for 2 weeks.  He had brain surgery one year ago.  He has been back and forth seeing his regular doctor for the same.  He is very heat sensitive.  His rt ear is very red with heat since his brain surgery and he feels like his sinus are stopped up.  He feels like hius brain is shifting in his head

## 2013-12-17 NOTE — ED Notes (Signed)
Pt states that the pain started after trying to work on the TV for several hours. States that his PCP has changed his BP medications in a attempt to help with BP and HA. States it may have worked a little.

## 2013-12-17 NOTE — ED Notes (Signed)
Dr Lozier at bedside  

## 2013-12-18 NOTE — ED Provider Notes (Signed)
Medical screening examination/treatment/procedure(s) were conducted as a shared visit with non-physician practitioner(s) or resident  and myself.  I personally evaluated the patient during the encounter and agree with the findings.   I have personally reviewed any xrays and/ or EKG's with the provider and I agree with interpretation.  Patient with headache for 2 weeks, same location as previous brain surgery. Patient denies neuro symptoms, no neuro deficits on exam. Patient has had sinus symptoms and congestion. CT results reviewed showing sinus congestion, no bleeding. Patient has close outpatient followup.5+ strength in UE and LE with f/e at major joints. Sensation to palpation intact in UE and LE. CNs 2-12 grossly intact.  EOMFI.  PERRL.   Finger nose and coordination intact bilateral.   Visual fields intact to finger testing. No meningismus, no vision loss.  Headache   Enid Skeens, MD 12/18/13 [phone]

## 2014-03-11 ENCOUNTER — Ambulatory Visit (INDEPENDENT_AMBULATORY_CARE_PROVIDER_SITE_OTHER): Payer: Self-pay | Admitting: General Surgery

## 2014-03-15 ENCOUNTER — Encounter (HOSPITAL_COMMUNITY)
Admission: RE | Admit: 2014-03-15 | Discharge: 2014-03-15 | Disposition: A | Discharge status: Home / Self Care | Payer: 59 | Source: Ambulatory Visit | Attending: General Surgery | Admitting: General Surgery

## 2014-03-15 ENCOUNTER — Encounter (HOSPITAL_COMMUNITY): Payer: Self-pay

## 2014-03-15 ENCOUNTER — Telehealth (INDEPENDENT_AMBULATORY_CARE_PROVIDER_SITE_OTHER): Payer: Self-pay

## 2014-03-15 ENCOUNTER — Other Ambulatory Visit (HOSPITAL_COMMUNITY): Payer: Self-pay | Admitting: *Deleted

## 2014-03-15 DIAGNOSIS — I739 Peripheral vascular disease, unspecified: Secondary | ICD-10-CM | POA: Diagnosis not present

## 2014-03-15 DIAGNOSIS — Z8249 Family history of ischemic heart disease and other diseases of the circulatory system: Secondary | ICD-10-CM | POA: Diagnosis not present

## 2014-03-15 DIAGNOSIS — I10 Essential (primary) hypertension: Secondary | ICD-10-CM | POA: Diagnosis not present

## 2014-03-15 DIAGNOSIS — Z79899 Other long term (current) drug therapy: Secondary | ICD-10-CM | POA: Diagnosis not present

## 2014-03-15 DIAGNOSIS — K4020 Bilateral inguinal hernia, without obstruction or gangrene, not specified as recurrent: Secondary | ICD-10-CM | POA: Diagnosis present

## 2014-03-15 DIAGNOSIS — I671 Cerebral aneurysm, nonruptured: Secondary | ICD-10-CM | POA: Diagnosis not present

## 2014-03-15 HISTORY — DX: Unspecified convulsions: R56.9

## 2014-03-15 HISTORY — DX: Functional dyspepsia: K30

## 2014-03-15 HISTORY — DX: Cerebral infarction, unspecified: I63.9

## 2014-03-15 LAB — CBC
HEMATOCRIT: 43 % (ref 39.0–52.0)
HEMOGLOBIN: 15 g/dL (ref 13.0–17.0)
MCH: 29.4 pg (ref 26.0–34.0)
MCHC: 34.9 g/dL (ref 30.0–36.0)
MCV: 84.3 fL (ref 78.0–100.0)
Platelets: 193 10*3/uL (ref 150–400)
RBC: 5.1 MIL/uL (ref 4.22–5.81)
RDW: 12.3 % (ref 11.5–15.5)
WBC: 4 10*3/uL (ref 4.0–10.5)

## 2014-03-15 LAB — BASIC METABOLIC PANEL
ANION GAP: 10 (ref 5–15)
BUN: 13 mg/dL (ref 6–23)
CALCIUM: 9.5 mg/dL (ref 8.4–10.5)
CHLORIDE: 102 meq/L (ref 96–112)
CO2: 26 mEq/L (ref 19–32)
CREATININE: 0.77 mg/dL (ref 0.50–1.35)
GFR calc Af Amer: >90 mL/min (ref >90)
GFR calc non Af Amer: >90 mL/min (ref >90)
Glucose, Bld: 89 mg/dL (ref 70–99)
Potassium: 4.4 mEq/L (ref 3.7–5.3)
Sodium: 138 mEq/L (ref 137–147)

## 2014-03-15 NOTE — Pre-Procedure Instructions (Signed)
Luis Oconnor  03/15/2014   Your procedure is scheduled on:  Thursday, March 17, 2014 at 9:20 AM.   [facility] at 7:15 AM.   Call this number if you have problems the morning of surgery: [phone]   Remember:   Do not eat food or drink liquids after midnight Wednesday, 03/16/14.   Take these medicines the morning of surgery with A SIP OF WATER: amLODipine (NORVASC),  atenolol (TENORMIN), carbamazepine (TEGRETOL XR), hydrALAZINE (APRESOLINE),  fluticasone (FLONASE), Tylenol if needed.  Stop Vitamins as of today.   Do not wear jewelry.  Do not wear lotions, powders, or cologne. You may wear deodorant.  Men may shave face and neck.  Do not bring valuables to the hospital.  [facility] not responsible                  for any belongings or valuables.               Contacts, dentures or bridgework may not be worn into surgery.  Leave suitcase in the car. After surgery it may be brought to your room.  For patients admitted to the hospital, discharge time is determined by your                treatment team.               Patients discharged the day of surgery will not be allowed to drive home.    Special Instructions:  - Preparing for Surgery  Before surgery, you can play an important role.  Because skin is not sterile, your skin needs to be as free of germs as possible.  You can reduce the number of germs on you skin by washing with CHG (chlorahexidine gluconate) soap before surgery.  CHG is an antiseptic cleaner which kills germs and bonds with the skin to continue killing germs even after washing.  Please DO NOT use if you have an allergy to CHG or antibacterial soaps.  If your skin becomes reddened/irritated stop using the CHG and inform your nurse when you arrive at Short Stay.  Do not shave (including legs and underarms) for at least 48 hours prior to the first CHG shower.  You may shave your face.  Please  follow these instructions carefully:   1.  Shower with CHG Soap the night before surgery and the                                morning of Surgery.  2.  If you choose to wash your hair, wash your hair first as usual with your       normal shampoo.  3.  After you shampoo, rinse your hair and body thoroughly to remove the                      Shampoo.  4.  Use CHG as you would any other liquid soap.  You can apply chg directly       to the skin and wash gently with scrungie or a clean washcloth.  5.  Apply the CHG Soap to your body ONLY FROM THE NECK DOWN.        Do not use on open wounds or open sores.  Avoid contact with your eyes, ears, mouth and genitals (private parts).  Wash genitals (private parts) with your normal  soap.  6.  Wash thoroughly, paying special attention to the area where your surgery        will be performed.  7.  Thoroughly rinse your body with warm water from the neck down.  8.  DO NOT shower/wash with your normal soap after using and rinsing off       the CHG Soap.  9.  Pat yourself dry with a clean towel.            10.  Wear clean pajamas.            11.  Place clean sheets on your bed the night of your first shower and do not        sleep with pets.  Day of Surgery  Do not apply any lotions the morning of surgery.  Please wear clean clothes to the hospital.     Please read over the following fact sheets that you were given: Pain Booklet, Coughing and Deep Breathing and Surgical Site Infection Prevention

## 2014-03-15 NOTE — Progress Notes (Signed)
Anesthesia Chart Review:  Pt is 58 year old male scheduled for laparoscopic R inguinal hernia repair with mesh on 03/17/2014 with Dr. Derrell Lollingamirez.   PMH: HTN, stroke, brain aneurysm, seizures, cerebral hematoma due to HTN requiring craniotomy for evacuation 05/2012. Hx crack cocaine use.   Medications include: lisinopril, hydralazine, atenolol, amlodipine, tegretol.   BP at PAT 179/91.  Preoperative labs reviewed.    EKG: Sinus bradycardia (59 bpm). Left axis deviation. ST & T wave abnormality, consider lateral ischemia. No significant change since last tracing 04/23/2013.   Discussed with Dr. Krista BlueSinger.   If no changes, I anticipate pt can proceed with surgery as scheduled.   Rica Mastngela Lalla Laham, FNP-BC [facility]hesiology Phone: [phone] 03/15/2014 4:11 PM

## 2014-03-15 NOTE — Progress Notes (Signed)
During PAT appt pt mentioned that he is now having pain in left groin very similar to what he is having on the right. This just recently started.  I instructed him to call Dr. Derrell Lollingamirez and let him know this. Pt and fiance voiced understanding.  Pt denies chest pain or sob. He denies any cardiac history. Pt's PCP is Dr. Willey BladeEric Dean.

## 2014-03-15 NOTE — Telephone Encounter (Signed)
Return call to patient from message left on surgery scheduling voicemail.  Patients girlfriend states he has a lump that has popped up in the left inguinal area.  Patient concerned it may be a left inguinal hernia.  Reviewed with Dr. Derrell Lollingamirez and will examine the patient the morning of surgery.  Patient aware and verbalized understanding.

## 2014-03-16 MED ORDER — CHLORHEXIDINE GLUCONATE 4 % EX LIQD
1.0000 "application " | Freq: Once | CUTANEOUS | Status: DC
  Filled 2014-03-16: qty 15

## 2014-03-16 MED ORDER — CEFAZOLIN SODIUM-DEXTROSE 2-3 GM-% IV SOLR
2.0000 g | INTRAVENOUS | Status: AC
  Administered 2014-03-17: 2 g via INTRAVENOUS
  Filled 2014-03-16: qty 50

## 2014-03-17 ENCOUNTER — Ambulatory Visit (HOSPITAL_COMMUNITY): Payer: 59 | Admitting: Emergency Medicine

## 2014-03-17 ENCOUNTER — Ambulatory Visit (HOSPITAL_COMMUNITY)
Admission: RE | Admit: 2014-03-17 | Discharge: 2014-03-17 | Disposition: A | Discharge status: Home / Self Care | Payer: 59 | Source: Ambulatory Visit | Attending: General Surgery | Admitting: General Surgery

## 2014-03-17 ENCOUNTER — Encounter (HOSPITAL_COMMUNITY)
Admission: RE | Disposition: A | Discharge status: Home / Self Care | Payer: Self-pay | Source: Ambulatory Visit | Attending: General Surgery

## 2014-03-17 ENCOUNTER — Encounter (HOSPITAL_COMMUNITY): Payer: Self-pay | Admitting: Surgery

## 2014-03-17 ENCOUNTER — Ambulatory Visit (HOSPITAL_COMMUNITY): Payer: 59 | Admitting: Anesthesiology

## 2014-03-17 DIAGNOSIS — K4020 Bilateral inguinal hernia, without obstruction or gangrene, not specified as recurrent: Secondary | ICD-10-CM | POA: Insufficient documentation

## 2014-03-17 DIAGNOSIS — I10 Essential (primary) hypertension: Secondary | ICD-10-CM | POA: Insufficient documentation

## 2014-03-17 DIAGNOSIS — Z8249 Family history of ischemic heart disease and other diseases of the circulatory system: Secondary | ICD-10-CM | POA: Insufficient documentation

## 2014-03-17 DIAGNOSIS — I739 Peripheral vascular disease, unspecified: Secondary | ICD-10-CM | POA: Insufficient documentation

## 2014-03-17 DIAGNOSIS — I671 Cerebral aneurysm, nonruptured: Secondary | ICD-10-CM | POA: Insufficient documentation

## 2014-03-17 DIAGNOSIS — Z79899 Other long term (current) drug therapy: Secondary | ICD-10-CM | POA: Insufficient documentation

## 2014-03-17 HISTORY — PX: INGUINAL HERNIA REPAIR: SHX194

## 2014-03-17 HISTORY — PX: INSERTION OF MESH: SHX5868

## 2014-03-17 SURGERY — REPAIR, HERNIA, INGUINAL, LAPAROSCOPIC
Anesthesia: General

## 2014-03-17 MED ORDER — HYDROMORPHONE HCL 1 MG/ML IJ SOLN
INTRAMUSCULAR | Status: AC
  Filled 2014-03-17: qty 1

## 2014-03-17 MED ORDER — MIDAZOLAM HCL 2 MG/2ML IJ SOLN
INTRAMUSCULAR | Status: AC
  Filled 2014-03-17: qty 2

## 2014-03-17 MED ORDER — ACETAMINOPHEN 650 MG RE SUPP: 650.0000 mg | RECTAL | Status: DC | PRN

## 2014-03-17 MED ORDER — PROMETHAZINE HCL 25 MG/ML IJ SOLN: 6.2500 mg | INTRAMUSCULAR | Status: DC | PRN

## 2014-03-17 MED ORDER — 0.9 % SODIUM CHLORIDE (POUR BTL) OPTIME
TOPICAL | Status: DC | PRN
  Administered 2014-03-17: 1000 mL

## 2014-03-17 MED ORDER — NEOSTIGMINE METHYLSULFATE 10 MG/10ML IV SOLN
INTRAVENOUS | Status: DC | PRN
Start: 2014-03-17 — End: 2014-03-17
  Administered 2014-03-17: 3 mg via INTRAVENOUS

## 2014-03-17 MED ORDER — GLYCOPYRROLATE 0.2 MG/ML IJ SOLN
INTRAMUSCULAR | Status: DC | PRN
  Administered 2014-03-17: 0.4 mg via INTRAVENOUS

## 2014-03-17 MED ORDER — LACTATED RINGERS IV SOLN
INTRAVENOUS | Status: DC
  Administered 2014-03-17: 08:00:00 via INTRAVENOUS

## 2014-03-17 MED ORDER — SODIUM CHLORIDE 0.9 % IV SOLN: 250.0000 mL | INTRAVENOUS | Status: DC | PRN

## 2014-03-17 MED ORDER — MIDAZOLAM HCL 5 MG/5ML IJ SOLN
INTRAMUSCULAR | Status: DC | PRN
  Administered 2014-03-17: 2 mg via INTRAVENOUS

## 2014-03-17 MED ORDER — FENTANYL CITRATE 0.05 MG/ML IJ SOLN
INTRAMUSCULAR | Status: DC | PRN
  Administered 2014-03-17: 25 ug via INTRAVENOUS
  Administered 2014-03-17: 50 ug via INTRAVENOUS
  Administered 2014-03-17: 25 ug via INTRAVENOUS
  Administered 2014-03-17: 50 ug via INTRAVENOUS

## 2014-03-17 MED ORDER — KETOROLAC TROMETHAMINE 30 MG/ML IJ SOLN
INTRAMUSCULAR | Status: AC
  Filled 2014-03-17: qty 1

## 2014-03-17 MED ORDER — PROPOFOL 10 MG/ML IV BOLUS
INTRAVENOUS | Status: AC
  Filled 2014-03-17: qty 20

## 2014-03-17 MED ORDER — SODIUM CHLORIDE 0.9 % IJ SOLN: 3.0000 mL | Freq: Two times a day (BID) | INTRAMUSCULAR | Status: DC

## 2014-03-17 MED ORDER — KETOROLAC TROMETHAMINE 30 MG/ML IJ SOLN
30.0000 mg | Freq: Once | INTRAMUSCULAR | Status: AC | PRN
  Administered 2014-03-17: 30 mg via INTRAVENOUS

## 2014-03-17 MED ORDER — OXYCODONE-ACETAMINOPHEN 5-325 MG PO TABS: 1.0000 | ORAL_TABLET | ORAL | Status: DC | PRN

## 2014-03-17 MED ORDER — SODIUM CHLORIDE 0.9 % IJ SOLN: 3.0000 mL | INTRAMUSCULAR | Status: DC | PRN

## 2014-03-17 MED ORDER — ROCURONIUM BROMIDE 100 MG/10ML IV SOLN
INTRAVENOUS | Status: DC | PRN
  Administered 2014-03-17: 10 mg via INTRAVENOUS
  Administered 2014-03-17: 40 mg via INTRAVENOUS

## 2014-03-17 MED ORDER — FENTANYL CITRATE 0.05 MG/ML IJ SOLN
INTRAMUSCULAR | Status: AC
  Filled 2014-03-17: qty 5

## 2014-03-17 MED ORDER — BUPIVACAINE HCL 0.25 % IJ SOLN
INTRAMUSCULAR | Status: DC | PRN
  Administered 2014-03-17: 3 mL

## 2014-03-17 MED ORDER — LIDOCAINE HCL (CARDIAC) 20 MG/ML IV SOLN
INTRAVENOUS | Status: DC | PRN
  Administered 2014-03-17: 60 mg via INTRAVENOUS

## 2014-03-17 MED ORDER — OXYCODONE HCL 5 MG PO TABS
5.0000 mg | ORAL_TABLET | ORAL | Status: DC | PRN
  Administered 2014-03-17: 10 mg via ORAL

## 2014-03-17 MED ORDER — OXYCODONE HCL 5 MG PO TABS
ORAL_TABLET | ORAL | Status: AC
  Filled 2014-03-17: qty 2

## 2014-03-17 MED ORDER — ACETAMINOPHEN 325 MG PO TABS: 650.0000 mg | ORAL_TABLET | ORAL | Status: DC | PRN

## 2014-03-17 MED ORDER — PROPOFOL 10 MG/ML IV BOLUS
INTRAVENOUS | Status: DC | PRN
  Administered 2014-03-17: 120 mg via INTRAVENOUS

## 2014-03-17 MED ORDER — LIDOCAINE HCL (CARDIAC) 20 MG/ML IV SOLN
INTRAVENOUS | Status: AC
  Filled 2014-03-17: qty 5

## 2014-03-17 MED ORDER — HYDROMORPHONE HCL 1 MG/ML IJ SOLN
0.2500 mg | INTRAMUSCULAR | Status: DC | PRN
  Administered 2014-03-17 (×4): 0.5 mg via INTRAVENOUS

## 2014-03-17 MED ORDER — SODIUM CHLORIDE 0.9 % IR SOLN
Status: DC | PRN
  Administered 2014-03-17: 1000 mL

## 2014-03-17 MED ORDER — HYDROMORPHONE HCL 1 MG/ML IJ SOLN
INTRAMUSCULAR | Status: AC
  Administered 2014-03-17: 0.5 mg via INTRAVENOUS
  Filled 2014-03-17: qty 1

## 2014-03-17 MED ORDER — ONDANSETRON HCL 4 MG/2ML IJ SOLN
INTRAMUSCULAR | Status: AC
  Filled 2014-03-17: qty 2

## 2014-03-17 MED ORDER — ONDANSETRON HCL 4 MG/2ML IJ SOLN
INTRAMUSCULAR | Status: DC | PRN
  Administered 2014-03-17: 4 mg via INTRAVENOUS

## 2014-03-17 SURGICAL SUPPLY — 49 items
APL SKNCLS STERI-STRIP NONHPOA (GAUZE/BANDAGES/DRESSINGS) ×2
APPLIER CLIP 5 13 M/L LIGAMAX5 (MISCELLANEOUS)
APR CLP MED LRG 5 ANG JAW (MISCELLANEOUS)
BENZOIN TINCTURE PRP APPL 2/3 (GAUZE/BANDAGES/DRESSINGS) ×4 IMPLANT
CANISTER SUCTION 2500CC (MISCELLANEOUS) IMPLANT
CHLORAPREP W/TINT 26ML (MISCELLANEOUS) ×4 IMPLANT
CLIP APPLIE 5 13 M/L LIGAMAX5 (MISCELLANEOUS) IMPLANT
CLOSURE STERI-STRIP 1/2X4 (GAUZE/BANDAGES/DRESSINGS) ×1
CLOSURE WOUND 1/2 X4 (GAUZE/BANDAGES/DRESSINGS) ×1
CLSR STERI-STRIP ANTIMIC 1/2X4 (GAUZE/BANDAGES/DRESSINGS) ×1 IMPLANT
COVER SURGICAL LIGHT HANDLE (MISCELLANEOUS) ×4 IMPLANT
DISSECTOR BLUNT TIP ENDO 5MM (MISCELLANEOUS) IMPLANT
DRAPE LAPAROSCOPIC ABDOMINAL (DRAPES) ×4 IMPLANT
DRAPE UTILITY XL STRL (DRAPES) ×8 IMPLANT
ELECT REM PT RETURN 9FT ADLT (ELECTROSURGICAL) ×4
ELECTRODE REM PT RTRN 9FT ADLT (ELECTROSURGICAL) ×2 IMPLANT
GAUZE SPONGE 2X2 8PLY STRL LF (GAUZE/BANDAGES/DRESSINGS) ×2 IMPLANT
GLOVE BIO SURGEON STRL SZ7.5 (GLOVE) ×4 IMPLANT
GOWN STRL REUS W/ TWL LRG LVL3 (GOWN DISPOSABLE) ×4 IMPLANT
GOWN STRL REUS W/ TWL XL LVL3 (GOWN DISPOSABLE) ×2 IMPLANT
GOWN STRL REUS W/TWL LRG LVL3 (GOWN DISPOSABLE) ×8
GOWN STRL REUS W/TWL XL LVL3 (GOWN DISPOSABLE) ×4
KIT BASIN OR (CUSTOM PROCEDURE TRAY) ×4 IMPLANT
KIT ROOM TURNOVER OR (KITS) ×4 IMPLANT
MESH 3DMAX 4X6 LT LRG (Mesh General) ×2 IMPLANT
MESH 3DMAX 4X6 RT LRG (Mesh General) ×2 IMPLANT
NDL INSUFFLATION 14GA 120MM (NEEDLE) ×2 IMPLANT
NEEDLE INSUFFLATION 14GA 120MM (NEEDLE) ×4 IMPLANT
NS IRRIG 1000ML POUR BTL (IV SOLUTION) ×4 IMPLANT
PAD ARMBOARD 7.5X6 YLW CONV (MISCELLANEOUS) ×8 IMPLANT
RELOAD STAPLE 4.0 BLU F/HERNIA (INSTRUMENTS) ×2 IMPLANT
RELOAD STAPLE 4.8 BLK F/HERNIA (STAPLE) IMPLANT
RELOAD STAPLE HERNIA 4.0 BLUE (INSTRUMENTS) ×4 IMPLANT
RELOAD STAPLE HERNIA 4.8 BLK (STAPLE) IMPLANT
SCISSORS LAP 5X35 DISP (ENDOMECHANICALS) ×4 IMPLANT
SET IRRIG TUBING LAPAROSCOPIC (IRRIGATION / IRRIGATOR) ×2 IMPLANT
SET TROCAR LAP APPLE-HUNT 5MM (ENDOMECHANICALS) ×4 IMPLANT
SPONGE GAUZE 2X2 STER 10/PKG (GAUZE/BANDAGES/DRESSINGS) ×2
STAPLER HERNIA 12 8.5 360D (INSTRUMENTS) ×4 IMPLANT
STRIP CLOSURE SKIN 1/2X4 (GAUZE/BANDAGES/DRESSINGS) ×3 IMPLANT
SUT MNCRL AB 4-0 PS2 18 (SUTURE) ×4 IMPLANT
SUT VIC AB 1 CT1 27 (SUTURE)
SUT VIC AB 1 CT1 27XBRD ANBCTR (SUTURE) IMPLANT
TOWEL OR 17X24 6PK STRL BLUE (TOWEL DISPOSABLE) ×4 IMPLANT
TOWEL OR 17X26 10 PK STRL BLUE (TOWEL DISPOSABLE) ×4 IMPLANT
TRAY FOLEY CATH 16FR SILVER (SET/KITS/TRAYS/PACK) ×4 IMPLANT
TRAY LAPAROSCOPIC (CUSTOM PROCEDURE TRAY) ×4 IMPLANT
TROCAR XCEL 12X100 BLDLESS (ENDOMECHANICALS) IMPLANT
TUBING INSUFFLATION (TUBING) ×4 IMPLANT

## 2014-03-17 NOTE — H&P (Signed)
HPI Luis Oconnor is a 58 y.o. male. The patient is a 58 year old male who is referred by Dr. Bary RichardE. Dean for evaluation of a right inguinal hernia. Patient states this is been here for approximately 6 months. He states he has a discomfort in that area. He's never had any incarceration or strain patient.  Of note the patient's had previous brain aneurysm which required craniotomy and hematoma evacuation 2014. The patient currently is on been followed by Dr. August Saucerean for this.  HPI  Past Medical History  Diagnosis Date  . Hypertension   . Ulcer   . Brain aneurysm   . Epistaxis, recurrent     Past Surgical History  Procedure Laterality Date  . Craniotomy Right 06/14/2012    Procedure: CRANIOTOMY HEMATOMA EVACUATION SUBDURAL; Surgeon: Reinaldo Meekerandy O Kritzer, MD; Location: [facility]; Service: Neurosurgery; Laterality: Right;    Family History  Problem Relation Age of Onset  . Hypertension Mother   . Hypertension Father   . Cancer Brother     leukemia  . Renal Disease Brother     Social History History  Substance Use Topics  . Smoking status: Never Smoker   . Smokeless tobacco: Not on file  . Alcohol Use: No    No Known Allergies  Current Outpatient Prescriptions  Medication Sig Dispense Refill  . acetaminophen (TYLENOL) 500 MG tablet Take 500 mg by mouth every 6 (six) hours as needed for pain.    Marland Kitchen. amLODipine (NORVASC) 10 MG tablet Take 10 mg by mouth daily.    Marland Kitchen. atenolol (TENORMIN) 50 MG tablet Take 25 mg by mouth daily at 12 noon.     . carbamazepine (TEGRETOL XR) 200 MG 12 hr tablet Take 200 mg by mouth 2 (two) times daily.    . hydrALAZINE (APRESOLINE) 25 MG tablet Take 12.5 mg by mouth 4 (four) times daily.    Marland Kitchen. ibuprofen (ADVIL,MOTRIN) 800 MG tablet Take 1 tablet (800 mg total) by mouth 3 (three) times daily. Take with food. Take only for one week. 21 tablet 0   . lisinopril (PRINIVIL,ZESTRIL) 40 MG tablet Take 40 mg by mouth 2 (two) times daily.     . Menthol, Topical Analgesic, (ICY HOT EX) Apply 1 application topically as needed (for right leg pain).    . Multiple Vitamins-Minerals (MULTIVITAMIN) tablet Take 0.5 tablets by mouth daily.    Marland Kitchen. omeprazole (PRILOSEC) 20 MG capsule Take 1 capsule (20 mg total) by mouth daily. 30 capsule 0   No current facility-administered medications for this visit.    Review of Systems Review of Systems  Constitutional: Negative.  HENT: Negative.  Eyes: Negative.  Respiratory: Negative.  Cardiovascular: Negative.  Gastrointestinal: Negative.  Endocrine: Negative.  Neurological: Negative.    Blood pressure 142/84, pulse 52, temperature 97.5 F (36.4 C), height 5\' 6"  (1.676 m), weight 134 lb (60.782 kg).  Physical Exam Physical Exam  Constitutional: He is oriented to person, place, and time. He appears well-developed and well-nourished.  HENT:  Head: Normocephalic and atraumatic.  Eyes: Conjunctivae and EOM are normal. Pupils are equal, round, and reactive to light.  Neck: Normal range of motion. Neck supple.  Cardiovascular: Normal rate, regular rhythm and normal heart sounds.  Pulmonary/Chest: Effort normal and breath sounds normal.  Abdominal: A hernia is present. Hernia confirmed positive in the right inguinal area. Hernia confirmed negative in the left inguinal area.  Musculoskeletal: Normal range of motion.  Neurological: He is alert and oriented to person, place, and time.  Skin: Skin  is warm and dry.    Assessment    58 year old male with a right inguinal hernia, likely indirect. Patient with previous history of hypertension as well as a brain aneurysm, status post craniotomy     Plan    1. We'll proceed with a laparoscopic right inguinal hernia repair with mesh, possible bilateral hernia repair. 2. All risks and benefits were discussed with the  patient, to generally include infection, bleeding, damage to surrounding structures, acute and chronic nerve pain, and recurrence. Alternatives were offered and described. All questions were answered and the patient voiced understanding of the procedure and wishes to proceed at this point.

## 2014-03-17 NOTE — Anesthesia Procedure Notes (Signed)
Procedure Name: Intubation Date/Time: 03/17/2014 9:11 AM Performed by: Quentin OreWALKER, Kamariya Blevens E Pre-anesthesia Checklist: Patient identified, Emergency Drugs available, Suction available, Patient being monitored and Timeout performed Patient Re-evaluated:Patient Re-evaluated prior to inductionOxygen Delivery Method: Circle system utilized Preoxygenation: Pre-oxygenation with 100% oxygen Intubation Type: IV induction Ventilation: Mask ventilation without difficulty Laryngoscope Size: Mac and 4 Grade View: Grade I Tube type: Oral Tube size: 7.5 mm Number of attempts: 1 Airway Equipment and Method: Stylet Placement Confirmation: ETT inserted through vocal cords under direct vision,  positive ETCO2 and breath sounds checked- equal and bilateral Secured at: 22 cm Tube secured with: Tape Dental Injury: Teeth and Oropharynx as per pre-operative assessment

## 2014-03-17 NOTE — Anesthesia Postprocedure Evaluation (Signed)
  Anesthesia Post-op Note  Patient: Luis Oconnor  Procedure(s) Performed: Procedure(s) (LRB): LAPAROSCOPIC BILATERAL INGUINAL HERNIA REPAIR WITH MESH (Bilateral) INSERTION OF MESH (N/A)  Patient Location: PACU  Anesthesia Type: General  Level of Consciousness: awake and alert   Airway and Oxygen Therapy: Patient Spontanous Breathing  Post-op Pain: mild  Post-op Assessment: Post-op Vital signs reviewed, Patient's Cardiovascular Status Stable, Respiratory Function Stable, Patent Airway and No signs of Nausea or vomiting  Last Vitals:  Filed Vitals:   03/17/14 1115  BP: 158/85  Pulse: 74  Temp:   Resp: 17    Post-op Vital Signs: stable   Complications: No apparent anesthesia complications

## 2014-03-17 NOTE — Op Note (Signed)
03/17/2014  10:15 AM  PATIENT:  Berniece PapNolan M Pillay  58 y.o. male  PRE-OPERATIVE DIAGNOSIS:  BILATERAL INGUINAL HERNIA  POST-OPERATIVE DIAGNOSIS:  BILATERAL INGUINAL HERNIA  PROCEDURE:  Procedure(s): LAPAROSCOPIC BILATERAL INGUINAL HERNIA REPAIR WITH MESH (Bilateral) INSERTION OF MESH (N/A)  SURGEON:  Surgeon(s) and Role:    * Axel FillerArmando Iveth Heidemann, MD - Primary  ANESTHESIA:   local and general  EBL:     BLOOD ADMINISTERED:none  DRAINS: none   LOCAL MEDICATIONS USED:  BUPIVICAINE   SPECIMEN:  No Specimen  DISPOSITION OF SPECIMEN:  N/A  COUNTS:  YES  TOURNIQUET:  * No tourniquets in log *  DICTATION: .Dragon Dictation  Findings:  The patient had a moderated right indirect hernia and small left indirect hernia  Indications for procedure:  The patient is a 58 year old male with a right hernia for several months. Patient complained of symptomatology to his right and left inguinal areas. The patient was taken back for elective inguinal hernia repair.  Details of the procedure: The patient was taken back to the operating room. The patient was placed in supine position with bilateral SCDs in place.  The patient was prepped and draped in the usual sterile fashion.  After appropriate anitbiotics were confirmed, a time-out was confirmed and all facts were verified.  0.25% Marcaine was used to infiltrate the umbilical area. He was used to cut down the skin and blunt dissection was used to get the anterior fashion.  The anterior fascia was incised approximately 1 cm and the muscles were divided anteriorly. Blunt dissection was then used to create a space in the preperitoneal area. At this time a 10 mm camera was then introduced into the space and advanced the pubic tubercle and a 12 mm trocar was placed over this and insufflation was started.  At this time and space was created from medial to laterally the preperitoneal space. The hernia sac was identified in the indirect space. Dissection of the  hernia sac was undertaken the vas deferens was identified and protected in all parts of the case.  There was a small tear into the hernia sac. A Veress needle right upper quadrant to help evacuate the intraperitoneal air.  Once the hernia sac was taken down to approximately the umbilicus a Bard 3D max mesh was introduced into the preperitoneal space.  The mesh was brought over the direct and indirect hernia spaces and defect and anchored into place and secured to Cooper's ligament with 4.740mm staples from a Coviden hernia stapler. It was anchored to the anterior abdominal wall with 4.8 mm staples. .  At this time i proceeded to dissect out the left inguinal area and spermatic cord.  There was small left inguinal, indirect hernia.  This was dissected away in a similar fashion as the right side.  The Vas was identified and protected at all portions of the case.  A 3D max mesh was again placed to cover the direct and indirect spaces, and anchored in the same fashion as the right side.  The hernia sac was seen lying anterior to the mesh on the right and left sides. There was no staples placed laterally. The insufflation was evacuated. The trochars were removed. The anterior fascia was reapproximated using #1 Vicryl on a UR- 6.  Intra-abdominal air was evacuated and the Veress needle removed. The skin was reapproximated using 4-0 Monocryl subcuticular fashion the patient was awakened from general anesthesia and taken to recovery in stable condition.   PLAN OF CARE: Discharge to  home after PACU  PATIENT DISPOSITION:  PACU - hemodynamically stable.   Delay start of Pharmacological VTE agent (>24hrs) due to surgical blood loss or risk of bleeding: not applicable

## 2014-03-17 NOTE — Anesthesia Preprocedure Evaluation (Addendum)
Anesthesia Evaluation  Patient identified by MRN, date of birth, ID band Patient awake    Reviewed: Allergy & Precautions, H&P , NPO status , Patient's Chart, lab work & pertinent test results  Airway Mallampati: II  TM Distance: >3 FB Neck ROM: Full    Dental no notable dental hx. (+) Poor Dentition   Pulmonary neg pulmonary ROS,  breath sounds clear to auscultation  Pulmonary exam normal       Cardiovascular hypertension, Pt. on medications and Pt. on home beta blockers + Peripheral Vascular Disease Rhythm:Regular Rate:Normal     Neuro/Psych Seizures -,  CVA, No Residual Symptoms negative psych ROS   GI/Hepatic negative GI ROS, Neg liver ROS,   Endo/Other  negative endocrine ROS  Renal/GU negative Renal ROS  negative genitourinary   Musculoskeletal negative musculoskeletal ROS (+)   Abdominal   Peds negative pediatric ROS (+)  Hematology negative hematology ROS (+)   Anesthesia Other Findings   Reproductive/Obstetrics negative OB ROS                            Anesthesia Physical Anesthesia Plan  ASA: III  Anesthesia Plan: General   Post-op Pain Management:    Induction: Intravenous  Airway Management Planned: Oral ETT  Additional Equipment:   Intra-op Plan:   Post-operative Plan: Extubation in OR  Informed Consent: I have reviewed the patients History and Physical, chart, labs and discussed the procedure including the risks, benefits and alternatives for the proposed anesthesia with the patient or authorized representative who has indicated his/her understanding and acceptance.   Dental advisory given  Plan Discussed with: CRNA and Surgeon  Anesthesia Plan Comments:         Anesthesia Quick Evaluation

## 2014-03-17 NOTE — Transfer of Care (Signed)
Immediate Anesthesia Transfer of Care Note  Patient: Luis Oconnor  Procedure(s) Performed: Procedure(s): LAPAROSCOPIC BILATERAL INGUINAL HERNIA REPAIR WITH MESH (Bilateral) INSERTION OF MESH (N/A)  Patient Location: PACU  Anesthesia Type:General  Level of Consciousness: sedated  Airway & Oxygen Therapy: Patient Spontanous Breathing and Patient connected to face mask oxygen  Post-op Assessment: Report given to PACU RN and Post -op Vital signs reviewed and stable  Post vital signs: Reviewed and stable  Complications: No apparent anesthesia complications

## 2014-03-17 NOTE — Discharge Instructions (Signed)
[facility] _______[facility] ° °INGUINAL HERNIA REPAIR: POST OP INSTRUCTIONS ° °Always review your discharge instruction sheet given to you by the facility where your surgery was performed. °IF YOU HAVE DISABILITY OR FAMILY LEAVE FORMS, YOU MUST BRING THEM TO THE OFFICE FOR PROCESSING.   °DO NOT GIVE THEM TO YOUR DOCTOR. ° °1. A  prescription for pain medication may be given to you upon discharge.  Take your pain medication as prescribed, if needed.  If narcotic pain medicine is not needed, then you may take acetaminophen (Tylenol) or ibuprofen (Advil) as needed. °2. Take your usually prescribed medications unless otherwise directed. °3. If you need a refill on your pain medication, please contact your pharmacy.  They will contact our office to request authorization. Prescriptions will not be filled after 5 pm or on week-ends. °4. You should follow a light diet the first 24 hours after arrival home, such as soup and crackers, etc.  Be sure to include lots of fluids daily.  Resume your normal diet the day after surgery. °5. Most patients will experience some swelling and bruising around the umbilicus or in the groin and scrotum.  Ice packs and reclining will help.  Swelling and bruising can take several days to resolve.  °6. It is common to experience some constipation if taking pain medication after surgery.  Increasing fluid intake and taking a stool softener (such as Colace) will usually help or prevent this problem from occurring.  A mild laxative (Milk of Magnesia or Miralax) should be taken according to package directions if there are no bowel movements after 48 hours. °7. Unless discharge instructions indicate otherwise, you may remove your bandages 24-48 hours after surgery, and you may shower at that time.  You may have steri-strips (small skin tapes) in place directly over the incision.  These strips should be left on the skin for 7-10 days.  If your surgeon used skin glue on the incision, you  may shower in 24 hours.  The glue will flake off over the next 2-3 weeks.  Any sutures or staples will be removed at the office during your follow-up visit. °8. ACTIVITIES:  You may resume regular (light) daily activities beginning the next day--such as daily self-care, walking, climbing stairs--gradually increasing activities as tolerated.  You may have sexual intercourse when it is comfortable.  Refrain from any heavy lifting or straining until approved by your doctor. °a. You may drive when you are no longer taking prescription pain medication, you can comfortably wear a seatbelt, and you can safely maneuver your car and apply brakes. °b. RETURN TO WORK:  __________________________________________________________ °9. You should see your doctor in the office for a follow-up appointment approximately 2-3 weeks after your surgery.  Make sure that you call for this appointment within a day or two after you arrive home to insure a convenient appointment time. °10. OTHER INSTRUCTIONS:  __________________________________________________________________________________________________________________________________________________________________________________________  °WHEN TO CALL YOUR DOCTOR: °1. Fever over 101.0 °2. Inability to urinate °3. Nausea and/or vomiting °4. Extreme swelling or bruising °5. Continued bleeding from incision. °6. Increased pain, redness, or drainage from the incision ° °The clinic staff is available to answer your questions during regular business hours.  Please don’t hesitate to call and ask to speak to one of the nurses for clinical concerns.  If you have a medical emergency, go to the nearest emergency room or call 911.  A surgeon from [facility] is always on call at the hospital ° ° °[address], [address], [place], [place]  [address] ? ° [address] [address], [place], [place]   [address] °[phone] ? [phone] ? FAX [phone] °Web site:  [email or web address] ° ° °What to eat: ° °For your first meals, you should eat lightly; only small meals initially.  If you do not have nausea, you may eat larger meals.  Avoid spicy, greasy and heavy food.   ° °General Anesthesia, Adult, Care After  °Refer to this sheet in the next few weeks. These instructions provide you with information on caring for yourself after your procedure. Your health care provider may also give you more specific instructions. Your treatment has been planned according to current medical practices, but problems sometimes occur. Call your health care provider if you have any problems or questions after your procedure.  °WHAT TO EXPECT AFTER THE PROCEDURE  °After the procedure, it is typical to experience:  °Sleepiness.  °Nausea and vomiting. °HOME CARE INSTRUCTIONS  °For the first 24 hours after general anesthesia:  °Have a responsible person with you.  °Do not drive a car. If you are alone, do not take public transportation.  °Do not drink alcohol.  °Do not take medicine that has not been prescribed by your health care provider.  °Do not sign important papers or make important decisions.  °You may resume a normal diet and activities as directed by your health care provider.  °Change bandages (dressings) as directed.  °If you have questions or problems that seem related to general anesthesia, call the hospital and ask for the anesthetist or anesthesiologist on call. °SEEK MEDICAL CARE IF:  °You have nausea and vomiting that continue the day after anesthesia.  °You develop a rash. °SEEK IMMEDIATE MEDICAL CARE IF:  °You have difficulty breathing.  °You have chest pain.  °You have any allergic problems. °Document Released: 06/24/2000 Document Revised: 11/18/2012 Document Reviewed: 10/01/2012  °[employer]® Patient Information ©2014 [employer].  ° ° °

## 2014-03-18 ENCOUNTER — Encounter (HOSPITAL_COMMUNITY): Payer: Self-pay | Admitting: General Surgery

## 2014-05-19 ENCOUNTER — Emergency Department (HOSPITAL_COMMUNITY)
Admission: EM | Admit: 2014-05-19 | Discharge: 2014-05-19 | Disposition: A | Discharge status: Home / Self Care | Payer: 59 | Attending: Emergency Medicine | Admitting: Emergency Medicine

## 2014-05-19 ENCOUNTER — Encounter (HOSPITAL_COMMUNITY): Payer: Self-pay | Admitting: *Deleted

## 2014-05-19 DIAGNOSIS — M542 Cervicalgia: Secondary | ICD-10-CM | POA: Insufficient documentation

## 2014-05-19 DIAGNOSIS — Z8673 Personal history of transient ischemic attack (TIA), and cerebral infarction without residual deficits: Secondary | ICD-10-CM | POA: Insufficient documentation

## 2014-05-19 DIAGNOSIS — I10 Essential (primary) hypertension: Secondary | ICD-10-CM

## 2014-05-19 DIAGNOSIS — R51 Headache: Secondary | ICD-10-CM | POA: Diagnosis present

## 2014-05-19 DIAGNOSIS — Z79899 Other long term (current) drug therapy: Secondary | ICD-10-CM | POA: Diagnosis not present

## 2014-05-19 DIAGNOSIS — Z880 Allergy status to penicillin: Secondary | ICD-10-CM | POA: Diagnosis not present

## 2014-05-19 DIAGNOSIS — Z7951 Long term (current) use of inhaled steroids: Secondary | ICD-10-CM | POA: Insufficient documentation

## 2014-05-19 DIAGNOSIS — Z872 Personal history of diseases of the skin and subcutaneous tissue: Secondary | ICD-10-CM | POA: Insufficient documentation

## 2014-05-19 DIAGNOSIS — G8929 Other chronic pain: Secondary | ICD-10-CM | POA: Insufficient documentation

## 2014-05-19 DIAGNOSIS — R519 Headache, unspecified: Secondary | ICD-10-CM

## 2014-05-19 MED ORDER — CETIRIZINE HCL 10 MG PO CAPS: 10.0000 mg | ORAL_CAPSULE | Freq: Every day | ORAL | Status: DC

## 2014-05-19 NOTE — ED Provider Notes (Signed)
CSN: [id number]     Arrival date & time 05/19/14  1138 History   First MD Initiated Contact with Patient 05/19/14 1144     Chief Complaint  Patient presents with  . 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 Patient states that this started after a hernia surgery. Patient feels the pressure more when he twists or moves his neck. He does not have stiffness in his neck. States that his scalp is sensitive. No new head injury. No vision change. He does have some sinus pressure. He takes Flonase at times for this. No nausea or vomiting. Patient denies signs of stroke including: facial droop, slurred speech, aphasia, weakness/numbness in extremities, imbalance/trouble walking.   Patient is a 59 y.o. male presenting with headaches. The history is provided by the patient.  Headache Pain location:  R parietal and occipital Quality:  Dull Associated symptoms: neck pain   Associated symptoms: no congestion, no fever, no nausea, no neck stiffness, no numbness, no photophobia, no sinus pressure, no vomiting and no weakness     Past Medical History  Diagnosis Date  . Hypertension   . Ulcer   . Brain aneurysm   . Epistaxis, recurrent   . Stroke     tingling in right thumb (when he had brain aneurysm)  . Acid indigestion   . Seizures     had 2 seizures after brain surgery - Takes Tegretol   Past Surgical History  Procedure Laterality Date  . Craniotomy Right 06/14/2012    Procedure: CRANIOTOMY HEMATOMA EVACUATION SUBDURAL;  Surgeon: [name], MD;   Location: [facility];  Service: Neurosurgery;  Laterality: Right;  . Foot surgery Right   . Fracture surgery Right     ankle  . Skin graft Left     ring finger injury  . Inguinal hernia repair Bilateral 03/17/2014    Procedure: LAPAROSCOPIC BILATERAL INGUINAL HERNIA REPAIR WITH MESH;  Surgeon: Axel Filler, MD;  Location: [facility];  Service: General;  Laterality: Bilateral;  . Insertion of mesh N/A 03/17/2014    Procedure: INSERTION OF MESH;  Surgeon: Axel Filler, MD;  Location: [facility];  Service: General;  Laterality: N/A;   Family History  Problem Relation Age of Onset  . Hypertension Mother   . Heart attack Mother   . Hypertension Father   . Stomach cancer Father   . Cancer Brother     leukemia  . Renal Disease Brother    History  Substance Use Topics  . Smoking status: Never Smoker   . Smokeless tobacco: Never Used  . Alcohol Use: No     Comment: formerly drank occ beer. None since brain surgery    Review of Systems  Constitutional: Negative for fever.  HENT: Negative for congestion, dental problem, rhinorrhea and sinus pressure.   Eyes: Negative for photophobia, discharge, redness and visual disturbance.  Respiratory: Negative for shortness of breath.   Cardiovascular: Negative for chest pain.  Gastrointestinal: Negative for nausea and vomiting.  Musculoskeletal: Positive for neck pain. Negative for gait problem and neck stiffness.  Skin: Negative for rash.  Neurological: Positive for headaches. Negative for syncope, speech difficulty, weakness, light-headedness  and numbness.  Psychiatric/Behavioral: Negative for confusion.    Allergies  Penicillins  Home Medications   Prior to Admission medications   Medication Sig Start Date End Date Taking? Authorizing Provider  acetaminophen (TYLENOL) 500 MG tablet Take 500 mg by mouth every 6 (six) hours as needed for headache (pain).    Yes Historical Provider, MD  amLODipine (NORVASC) 10 MG tablet Take 10 mg by mouth  daily. 07/15/12  Yes Toy BakerAnthony T Allen, MD  atenolol (TENORMIN) 50 MG tablet Take 25 mg by mouth 2 (two) times daily.    Yes Historical Provider, MD  carbamazepine (TEGRETOL XR) 200 MG 12 hr tablet Take 200 mg by mouth 2 (two) times daily. 07/03/12  Yes Evlyn KannerPamela S Love, PA-C  fluticasone (FLONASE) 50 MCG/ACT nasal spray Place 2 sprays into both nostrils daily. 12/17/13 05/19/14 Yes Sena HitchStephen Lozier, MD  hydrALAZINE (APRESOLINE) 25 MG tablet Take 25 mg by mouth 4 (four) times daily.  03/29/13  Yes Nathan R. Pickering, MD  lisinopril (PRINIVIL,ZESTRIL) 40 MG tablet Take 40 mg by mouth 2 (two) times daily.    Yes Historical Provider, MD  Menthol, Topical Analgesic, (ICY HOT EX) Apply 1 application topically as needed (for right leg pain).   Yes Historical Provider, MD  Multiple Vitamins-Minerals (MULTIVITAMIN) tablet Take 1 tablet by mouth daily.  07/03/12  Yes Evlyn KannerPamela S Love, PA-C  oxyCODONE-acetaminophen (ROXICET) 5-325 MG per tablet Take 1-2 tablets by mouth every 4 (four) hours as needed. Patient not taking: Reported on 05/19/2014 03/17/14   Axel FillerArmando Ramirez, MD   BP 187/95 mmHg  Pulse 57  Temp(Src) 99.6 F (37.6 C) (Oral)  Resp 16  Ht 5\' 6"  (1.676 m)  Wt 141 lb (63.957 kg)  BMI 22.77 kg/m2  SpO2 100%   Physical Exam  Constitutional: He is oriented to person, place, and time. He appears well-developed and well-nourished.  HENT:  Head: Normocephalic and atraumatic.  Right Ear: Tympanic membrane, external ear and ear canal normal.  Left Ear: Tympanic membrane, external ear and ear canal normal.  Nose: Nose normal.  Mouth/Throat: Uvula is midline, oropharynx is clear and moist and mucous membranes are normal.  Eyes: Conjunctivae, EOM and lids are normal. Pupils are equal, round, and reactive to light.  Neck: Normal range of motion. Neck supple.  Cardiovascular: Normal rate and regular rhythm.   Pulmonary/Chest: Effort normal and breath sounds normal.  Abdominal: Soft. There is no tenderness.   Musculoskeletal: Normal range of motion.       Cervical back: He exhibits normal range of motion, no tenderness and no bony tenderness.  Neurological: He is alert and oriented to person, place, and time. He has normal strength and normal reflexes. No cranial nerve deficit or sensory deficit. He exhibits normal muscle tone. He displays a negative Romberg sign. Coordination and gait normal. GCS eye subscore is 4. GCS verbal subscore is 5. GCS motor subscore is 6.  Skin: Skin is warm and dry.  Psychiatric: He has a normal mood and affect.  Nursing note and vitals reviewed.   ED Course  Procedures (including critical care time) Labs Review Labs Reviewed - No data to display  Imaging Review No results found.   EKG Interpretation None      12:55 PM Patient seen and examined. Discussed with Dr. Juleen ChinaKohut who will see.    Vital signs reviewed and are as follows: BP 187/95 mmHg  Pulse 57  Temp(Src) 99.6 F (37.6 C) (Oral)  Resp 16  Ht 5\' 6"  (1.676 m)  Wt 141 lb (63.957 kg)  BMI 22.77 kg/m2  SpO2 100%  1:55 PM Patient seen by Dr. Juleen China. Exam is unremarkable. History is not concerning for acute bleed. We do not feel that additional imaging or work-up needs to be performed at this time. Patient can follow-up as outpatient. Will treat with antihistamine. Will avoid decongestants due to hypertension history.   Patient urged to return with worsening symptoms or other concerns. Patient verbalized understanding and agrees with plan.    MDM   Final diagnoses:  Chronic nonintractable headache, unspecified headache type  Essential hypertension   HA: Pt with chronic atypical HA x 6 weeks. Patient has a normal complete neurological exam, normal vital signs, normal level of consciousness, no signs of meningismus, is well-appearing/non-toxic appearing, no signs of trauma, no pain over the temporal arteries.   Imaging with CT/MRI not indicated given history and physical exam findings.   No  dangerous or life-threatening conditions suspected or identified by history, physical exam, and by work-up. No indications for hospitalization identified.   HTN: continue home meds, f/u with PCP, no evidence of end-organ damage.     Renne Crigler, PA-C 05/19/14 1622  Raeford Razor, MD 05/24/14 [phone]

## 2014-05-19 NOTE — Discharge Instructions (Signed)
Please read and follow all provided instructions.  Your diagnoses today include:  1. Chronic nonintractable headache, unspecified headache type     Tests performed today include:  Vital signs. See below for your results today.   Medications:   Zyrtec (cetirizine) - antihistamine that blocks allergic reaction  You can find this medication over-the-counter.   DO NOT exceed:   10mg  Zyrtec every 24 hours  Take any prescribed medications only as directed.  Additional information:  Follow any educational materials contained in this packet.  You are having a headache. No specific cause was found today for your headache. It may have been a migraine or other cause of headache. Stress, anxiety, fatigue, and depression are common triggers for headaches.   Your headache today does not appear to be life-threatening or require hospitalization, but often the exact cause of headaches is not determined in the emergency department. Therefore, follow-up with your doctor is very important to find out what may have caused your headache and whether or not you need any further diagnostic testing or treatment.   Sometimes headaches can appear benign (not harmful), but then more serious symptoms can develop which should prompt an immediate re-evaluation by your doctor or the emergency department.  BE VERY CAREFUL not to take multiple medicines containing Tylenol (also called acetaminophen). Doing so can lead to an overdose which can damage your liver and cause liver failure and possibly death.   Follow-up instructions: Please follow-up with your primary care provider in the next 3 days for further evaluation of your symptoms.   Return instructions:   Please return to the Emergency Department if you experience worsening symptoms.  Return if the medications do not resolve your headache, if it recurs, or if you have multiple episodes of vomiting or cannot keep down fluids.  Return if you have a change  from the usual headache.  RETURN IMMEDIATELY IF you:  Develop a sudden, severe headache  Develop confusion or become poorly responsive or faint  Develop a fever above 100.32F or problem breathing  Have a change in speech, vision, swallowing, or understanding  Develop new weakness, numbness, tingling, incoordination in your arms or legs  Have a seizure  Please return if you have any other emergent concerns.  Additional Information:  Your vital signs today were: BP 187/95 mmHg   Pulse 57   Temp(Src) 99.6 F (37.6 C) (Oral)   Resp 16   Ht 5\' 6"  (1.676 m)   Wt 141 lb (63.957 kg)   BMI 22.77 kg/m2   SpO2 100% If your blood pressure (BP) was elevated above 135/85 this visit, please have this repeated by your doctor within one month. --------------

## 2014-05-19 NOTE — ED Notes (Addendum)
Pt c/o pressure to L nostril and increasing pain he describes as stiffness to R parietal and R occipital regions.  Hx of brain aneurism repair in March.

## 2014-07-13 ENCOUNTER — Encounter: Payer: Self-pay | Admitting: Gastroenterology

## 2014-09-08 ENCOUNTER — Encounter: Payer: Self-pay | Admitting: General Surgery

## 2014-09-13 ENCOUNTER — Encounter (HOSPITAL_COMMUNITY): Payer: Self-pay

## 2014-09-13 ENCOUNTER — Emergency Department (HOSPITAL_COMMUNITY): Payer: 59

## 2014-09-13 ENCOUNTER — Emergency Department (HOSPITAL_COMMUNITY)
Admission: EM | Admit: 2014-09-13 | Discharge: 2014-09-14 | Disposition: A | Discharge status: Home / Self Care | Payer: 59 | Attending: Emergency Medicine | Admitting: Emergency Medicine

## 2014-09-13 DIAGNOSIS — Y939 Activity, unspecified: Secondary | ICD-10-CM | POA: Insufficient documentation

## 2014-09-13 DIAGNOSIS — Z8673 Personal history of transient ischemic attack (TIA), and cerebral infarction without residual deficits: Secondary | ICD-10-CM | POA: Insufficient documentation

## 2014-09-13 DIAGNOSIS — W19XXXA Unspecified fall, initial encounter: Secondary | ICD-10-CM

## 2014-09-13 DIAGNOSIS — M47812 Spondylosis without myelopathy or radiculopathy, cervical region: Secondary | ICD-10-CM | POA: Insufficient documentation

## 2014-09-13 DIAGNOSIS — I159 Secondary hypertension, unspecified: Secondary | ICD-10-CM | POA: Diagnosis not present

## 2014-09-13 DIAGNOSIS — Z79899 Other long term (current) drug therapy: Secondary | ICD-10-CM | POA: Insufficient documentation

## 2014-09-13 DIAGNOSIS — S0990XA Unspecified injury of head, initial encounter: Secondary | ICD-10-CM | POA: Insufficient documentation

## 2014-09-13 DIAGNOSIS — W010XXA Fall on same level from slipping, tripping and stumbling without subsequent striking against object, initial encounter: Secondary | ICD-10-CM | POA: Diagnosis not present

## 2014-09-13 DIAGNOSIS — Z880 Allergy status to penicillin: Secondary | ICD-10-CM | POA: Insufficient documentation

## 2014-09-13 DIAGNOSIS — Y999 Unspecified external cause status: Secondary | ICD-10-CM | POA: Insufficient documentation

## 2014-09-13 DIAGNOSIS — Y929 Unspecified place or not applicable: Secondary | ICD-10-CM | POA: Diagnosis not present

## 2014-09-13 DIAGNOSIS — I10 Essential (primary) hypertension: Secondary | ICD-10-CM | POA: Diagnosis not present

## 2014-09-13 LAB — I-STAT CHEM 8, ED
BUN: 14 mg/dL (ref 6–20)
CHLORIDE: 104 mmol/L (ref 101–111)
CREATININE: 0.9 mg/dL (ref 0.61–1.24)
Calcium, Ion: 1.05 mmol/L — ABNORMAL LOW (ref 1.13–1.30)
GLUCOSE: 91 mg/dL (ref 65–99)
HEMATOCRIT: 44 % (ref 39.0–52.0)
Hemoglobin: 15 g/dL (ref 13.0–17.0)
Potassium: 3.6 mmol/L (ref 3.5–5.1)
Sodium: 140 mmol/L (ref 135–145)
TCO2: 23 mmol/L (ref 0–100)

## 2014-09-13 MED ORDER — FENTANYL CITRATE (PF) 100 MCG/2ML IJ SOLN: 50.0000 ug | Freq: Once | INTRAMUSCULAR | Status: DC

## 2014-09-13 MED ORDER — KETOROLAC TROMETHAMINE 15 MG/ML IJ SOLN: 15.0000 mg | Freq: Once | INTRAMUSCULAR | Status: DC

## 2014-09-13 MED ORDER — OXYCODONE-ACETAMINOPHEN 7.5-325 MG PO TABS: 1.0000 | ORAL_TABLET | ORAL | Status: DC | PRN

## 2014-09-13 MED ORDER — KETOROLAC TROMETHAMINE 30 MG/ML IJ SOLN
30.0000 mg | Freq: Once | INTRAMUSCULAR | Status: AC
  Administered 2014-09-13: 30 mg via INTRAMUSCULAR
  Filled 2014-09-13: qty 1

## 2014-09-13 MED ORDER — FENTANYL CITRATE (PF) 100 MCG/2ML IJ SOLN
50.0000 ug | Freq: Once | INTRAMUSCULAR | Status: AC
  Administered 2014-09-13: 50 ug via INTRAMUSCULAR
  Filled 2014-09-13: qty 2

## 2014-09-13 NOTE — ED Notes (Signed)
Pt reports slipping on wet grass 1 week ago falling on right side of head and scraping knees.  Since then pt has had stiffness/tightness on right side of head and pain when turning neck side to side.  Was seen by PCP for same, BP med changed and did not say anything else about stiffness in head.

## 2014-09-13 NOTE — ED Provider Notes (Signed)
CSN: [id number]     Arrival date & time 09/13/14  1542 History   First MD Initiated Contact with Patient 09/13/14 1825     Chief Complaint  Patient presents with  . Headache      HPI Pt reports slipping on wet grass 1 week ago falling on right side of head and scraping knees. Since then pt has had stiffness/tightness on right side of head and pain when turning neck side to side. Was seen by PCP for same, BP med changed and did not say anything else about stiffness in head.  Past Medical History  Diagnosis Date  . Hypertension   . Ulcer   . Brain aneurysm   . Epistaxis, recurrent   . Stroke     tingling in right thumb (when he had brain aneurysm)  . Acid indigestion   . Seizures     had 2 seizures after brain surgery - Takes Tegretol   Past Surgical History  Procedure Laterality Date  . Craniotomy Right 06/14/2012    Procedure: CRANIOTOMY HEMATOMA EVACUATION SUBDURAL;  Surgeon: [name], MD;  Location: [facility];  Service: Neurosurgery;  Laterality: Right;  . Foot surgery Right   . Fracture surgery Right     ankle  . Skin graft Left     ring finger injury  . Inguinal hernia repair Bilateral 03/17/2014    Procedure: LAPAROSCOPIC BILATERAL INGUINAL HERNIA REPAIR WITH MESH;  Surgeon: Axel Filler, MD;  Location: [facility];  Service: General;  Laterality: Bilateral;  . Insertion of mesh N/A 03/17/2014    Procedure: INSERTION OF MESH;  Surgeon: Axel Filler, MD;  Location: [facility];  Service: General;  Laterality: N/A;   Family History  Problem Relation Age of Onset  . Hypertension Mother   . Heart attack Mother   . Hypertension Father   . Stomach cancer Father   . Cancer Brother     leukemia  . Renal Disease Brother    History  Substance Use Topics  . Smoking status: Never Smoker   . Smokeless tobacco: Never Used  . Alcohol Use: No     Comment: formerly drank occ beer. None since brain surgery    Review of Systems  All other systems reviewed and are  negative.     Allergies  Penicillins  Home Medications   Prior to Admission medications   Medication Sig Start Date End Date Taking? Authorizing Provider  acetaminophen (TYLENOL) 500 MG tablet Take 250-500 mg by mouth every 6 (six) hours as needed for headache (pain).    Yes Historical Provider, MD  amLODipine (NORVASC) 10 MG tablet Take 10 mg by mouth daily. 07/15/12  Yes Lorre Nick, MD  atenolol (TENORMIN) 25 MG tablet Take 12.5 mg by mouth 2 (two) times daily.   Yes Historical Provider, MD  carbamazepine (TEGRETOL XR) 200 MG 12 hr tablet Take 200 mg by mouth 2 (two) times daily. 07/03/12  Yes Evlyn Kanner Love, PA-C  fluticasone (FLONASE) 50 MCG/ACT nasal spray Place 1 spray into both nostrils daily as needed for allergies or rhinitis.   Yes Historical Provider, MD  hydrALAZINE (APRESOLINE) 25 MG tablet Take 25 mg by mouth 4 (four) times daily.  03/29/13  Yes Benjiman Core, MD  lisinopril (PRINIVIL,ZESTRIL) 40 MG tablet Take 80 mg by mouth daily.    Yes Historical Provider, MD  Multiple Vitamins-Minerals (MULTIVITAMIN) tablet Take 0.5 tablets by mouth 2 (two) times daily.  07/03/12  Yes Evlyn Kanner Love, PA-C  spironolactone (ALDACTONE)  25 MG tablet Take 25 mg by mouth daily.   Yes Historical Provider, MD  Cetirizine HCl 10 MG CAPS Take 1 capsule (10 mg total) by mouth daily. Patient not taking: Reported on 09/13/2014 05/19/14   Renne Crigler, PA-C  fluticasone [facility]) 50 MCG/ACT nasal spray Place 2 sprays into both nostrils daily. 12/17/13 05/19/14  Sena Hitch, MD  oxyCODONE-acetaminophen (PERCOCET) 7.5-325 MG per tablet Take 1 tablet by mouth every 4 (four) hours as needed for severe pain. 09/13/14   Nelva Nay, MD   BP 167/98 mmHg  Pulse 56  Temp(Src) 97.7 F (36.5 C) (Oral)  Resp 16  Ht  (1.676 m)  Wt 134 lb 12.8 oz (61.145 kg)  BMI 21.77 kg/m2  SpO2 99% Physical Exam  Constitutional: He is oriented to person, place, and time. He appears well-developed and  well-nourished. No distress.  HENT:  Head: Normocephalic and atraumatic.  Eyes: Pupils are equal, round, and reactive to light.  Neck: Normal range of motion.    Cardiovascular: Normal rate and intact distal pulses.   Pulmonary/Chest: No respiratory distress.  Abdominal: Normal appearance. He exhibits no distension.  Musculoskeletal: Normal range of motion.  Neurological: He is alert and oriented to person, place, and time. No cranial nerve deficit or sensory deficit. GCS eye subscore is 4. GCS verbal subscore is 5. GCS motor subscore is 6.  Skin: Skin is warm and dry. No rash noted.  Psychiatric: He has a normal mood and affect. His behavior is normal.  Nursing note and vitals reviewed.   ED Course  Procedures (including critical care time) Labs Review Labs Reviewed  I-STAT CHEM 8, ED - Abnormal; Notable for the following:    Calcium, Ion 1.05 (*)    All other components within normal limits    Imaging Review Ct Head Wo Contrast  09/13/2014   CLINICAL DATA:  Initial evaluation for acute trauma, fall 1 week ago. Patient fell onto right-sided head. Now with stiffness/ tightness on right-sided head and pain with turning to sign  EXAM: CT HEAD WITHOUT CONTRAST  CT CERVICAL SPINE WITHOUT CONTRAST  TECHNIQUE: Multidetector CT imaging of the head and cervical spine was performed following the standard protocol without intravenous contrast. Multiplanar CT image reconstructions of the cervical spine were also generated.  COMPARISON:  Prior study from 12/17/2013  FINDINGS: CT HEAD FINDINGS  Encephalomalacia within the right temporoparietal region compatible with remote right MCA territory infarct, stable. There is associated ex vacuo dilatation of the right lateral ventricle. Chronic small vessel ischemic changes present within the periventricular white matter. Small remote lacunar infarct present within the right basal ganglia.  No acute large vessel territory infarct. No acute intracranial  hemorrhage. No mass lesion, midline shift, or mass effect. No hydrocephalus.  Scalp soft tissues within normal limits.  Sequelae of remote right parietal craniotomy noted. Calvarium otherwise intact.  No acute abnormality about the globes.  Mild mucosal thickening present within the ethmoidal air cells, left sphenoid sinus, and partially visualized right maxillary sinus. No mastoid effusion.  CT CERVICAL SPINE FINDINGS  There is mild straightening of the normal cervical lordosis. Chronic pars defects present at C6 with 4 mm anterolisthesis of C6 on C7. Severe degenerative intervertebral disc space narrowing with endplate sclerosis and osteophytosis present at this level as well. Prominent anterior osteophytic spurring present throughout the cervical spine, most prevalent at C4-5 and C5-6.  No acute fracture listhesis. Prevertebral soft tissues are normal. No soft tissue abnormality within the neck. Small subcentimeter hypodense  nodule noted within the right lobe of thyroid. This is of doubtful clinical significance. Partially visualized lung apices are clear.  IMPRESSION: CT BRAIN:  1. No acute intracranial process. 2. Stable right temporoparietal encephalomalacia and sequela of remote right parietal craniotomy. 3. Moderate chronic microvascular ischemic disease.  CT CERVICAL SPINE:  1. No acute traumatic injury within the cervical spine. 2. Chronic bilateral pars defects at C6 with associated 4 mm anterolisthesis of C6 on C7 and severe degenerative disc disease at this level. 3. Prominent anterior osteophytic spurring throughout the cervical spine, most prevalent at C4-5.   Electronically Signed   By: Rise Mu M.D.   On: 09/13/2014 22:31   Ct Cervical Spine Wo Contrast  09/13/2014   CLINICAL DATA:  Initial evaluation for acute trauma, fall 1 week ago. Patient fell onto right-sided head. Now with stiffness/ tightness on right-sided head and pain with turning to sign  EXAM: CT HEAD WITHOUT CONTRAST  CT  CERVICAL SPINE WITHOUT CONTRAST  TECHNIQUE: Multidetector CT imaging of the head and cervical spine was performed following the standard protocol without intravenous contrast. Multiplanar CT image reconstructions of the cervical spine were also generated.  COMPARISON:  Prior study from 12/17/2013  FINDINGS: CT HEAD FINDINGS  Encephalomalacia within the right temporoparietal region compatible with remote right MCA territory infarct, stable. There is associated ex vacuo dilatation of the right lateral ventricle. Chronic small vessel ischemic changes present within the periventricular white matter. Small remote lacunar infarct present within the right basal ganglia.  No acute large vessel territory infarct. No acute intracranial hemorrhage. No mass lesion, midline shift, or mass effect. No hydrocephalus.  Scalp soft tissues within normal limits.  Sequelae of remote right parietal craniotomy noted. Calvarium otherwise intact.  No acute abnormality about the globes.  Mild mucosal thickening present within the ethmoidal air cells, left sphenoid sinus, and partially visualized right maxillary sinus. No mastoid effusion.  CT CERVICAL SPINE FINDINGS  There is mild straightening of the normal cervical lordosis. Chronic pars defects present at C6 with 4 mm anterolisthesis of C6 on C7. Severe degenerative intervertebral disc space narrowing with endplate sclerosis and osteophytosis present at this level as well. Prominent anterior osteophytic spurring present throughout the cervical spine, most prevalent at C4-5 and C5-6.  No acute fracture listhesis. Prevertebral soft tissues are normal. No soft tissue abnormality within the neck. Small subcentimeter hypodense nodule noted within the right lobe of thyroid. This is of doubtful clinical significance. Partially visualized lung apices are clear.  IMPRESSION: CT BRAIN:  1. No acute intracranial process. 2. Stable right temporoparietal encephalomalacia and sequela of remote right  parietal craniotomy. 3. Moderate chronic microvascular ischemic disease.  CT CERVICAL SPINE:  1. No acute traumatic injury within the cervical spine. 2. Chronic bilateral pars defects at C6 with associated 4 mm anterolisthesis of C6 on C7 and severe degenerative disc disease at this level. 3. Prominent anterior osteophytic spurring throughout the cervical spine, most prevalent at C4-5.   Electronically Signed   By: Rise Mu M.D.   On: 09/13/2014 22:31    Instructed patient to start taking his HTN medication as directed by his physician.  MDM   Final diagnoses:  Fall  Secondary hypertension, unspecified  Cervical arthritis        Nelva Nay, MD 09/13/14 2359

## 2014-09-13 NOTE — Discharge Instructions (Signed)
Fall Prevention and Home Safety Falls cause injuries and can affect all age groups. It is possible to prevent falls.  HOW TO PREVENT FALLS  Wear shoes with rubber soles that do not have an opening for your toes.  Keep the inside and outside of your house well lit.  Use night lights throughout your home.  Remove clutter from floors.  Clean up floor spills.  Remove throw rugs or fasten them to the floor with carpet tape.  Do not place electrical cords across pathways.  Put grab bars by your tub, shower, and toilet. Do not use towel bars as grab bars.  Put handrails on both sides of the stairway. Fix loose handrails.  Do not climb on stools or stepladders, if possible.  Do not wax your floors.  Repair uneven or unsafe sidewalks, walkways, or stairs.  Keep items you use a lot within reach.  Be aware of pets.  Keep emergency numbers next to the telephone.  Put smoke detectors in your home and near bedrooms. Ask your doctor what other things you can do to prevent falls. Document Released: 01/12/2009 Document Revised: 09/17/2011 Document Reviewed: 06/18/2011 [facility] Patient Information 2015 [place], [place]. This information is not intended to replace advice given to you by your health care provider. Make sure you discuss any questions you have with your health care provider.  Hypertension Hypertension, commonly called high blood pressure, is when the force of blood pumping through your arteries is too strong. Your arteries are the blood vessels that carry blood from your heart throughout your body. A blood pressure reading consists of a higher number over a lower number, such as 110/72. The higher number (systolic) is the pressure inside your arteries when your heart pumps. The lower number (diastolic) is the pressure inside your arteries when your heart relaxes. Ideally you want your blood pressure below 120/80. Hypertension forces your heart to work harder to pump blood. Your  arteries may become narrow or stiff. Having hypertension puts you at risk for heart disease, stroke, and other problems.  RISK FACTORS Some risk factors for high blood pressure are controllable. Others are not.  Risk factors you cannot control include:   Race. You may be at higher risk if you are African American.  Age. Risk increases with age.  Gender. Men are at higher risk than women before age 47 years. After age 63, women are at higher risk than men. Risk factors you can control include:  Not getting enough exercise or physical activity.  Being overweight.  Getting too much fat, sugar, calories, or salt in your diet.  Drinking too much alcohol. SIGNS AND SYMPTOMS Hypertension does not usually cause signs or symptoms. Extremely high blood pressure (hypertensive crisis) may cause headache, anxiety, shortness of breath, and nosebleed. DIAGNOSIS  To check if you have hypertension, your health care provider will measure your blood pressure while you are seated, with your arm held at the level of your heart. It should be measured at least twice using the same arm. Certain conditions can cause a difference in blood pressure between your right and left arms. A blood pressure reading that is higher than normal on one occasion does not mean that you need treatment. If one blood pressure reading is high, ask your health care provider about having it checked again. TREATMENT  Treating high blood pressure includes making lifestyle changes and possibly taking medicine. Living a healthy lifestyle can help lower high blood pressure. You may need to change some of  your habits. Lifestyle changes may include:  Following the DASH diet. This diet is high in fruits, vegetables, and whole grains. It is low in salt, red meat, and added sugars.  Getting at least 2 hours of brisk physical activity every week.  Losing weight if necessary.  Not smoking.  Limiting alcoholic beverages.  Learning ways to  reduce stress. If lifestyle changes are not enough to get your blood pressure under control, your health care provider may prescribe medicine. You may need to take more than one. Work closely with your health care provider to understand the risks and benefits. HOME CARE INSTRUCTIONS  Have your blood pressure rechecked as directed by your health care provider.   Take medicines only as directed by your health care provider. Follow the directions carefully. Blood pressure medicines must be taken as prescribed. The medicine does not work as well when you skip doses. Skipping doses also puts you at risk for problems.   Do not smoke.   Monitor your blood pressure at home as directed by your health care provider. SEEK MEDICAL CARE IF:   You think you are having a reaction to medicines taken.  You have recurrent headaches or feel dizzy.  You have swelling in your ankles.  You have trouble with your vision. SEEK IMMEDIATE MEDICAL CARE IF:  You develop a severe headache or confusion.  You have unusual weakness, numbness, or feel faint.  You have severe chest or abdominal pain.  You vomit repeatedly.  You have trouble breathing. MAKE SURE YOU:   Understand these instructions.  Will watch your condition.  Will get help right away if you are not doing well or get worse. Document Released: 03/18/2005 Document Revised: 08/02/2013 Document Reviewed: 01/08/2013 [facility] Patient Information 2015 [place], [place]. This information is not intended to replace advice given to you by your health care provider. Make sure you discuss any questions you have with your health care provider.  Headache and Arthritis Headaches and arthritis are common problems. This causes an interest in the possible role of arthritis in causing headaches. Several major forms of arthritis exist. Two of the most common types are:  Rheumatoid arthritis.  Osteoarthritis. Rheumatoid arthritis may begin at any age. It  is a condition in which the body attacks some of its own tissues, thinking they do not belong. This leads to destruction of the bony areas around the joints. This condition may afflict any of the body's joints. It usually produces a deformity of the joint. The hands and fingers no longer appear straight but often appear angled towards one side. In some cases, the spine may be involved. Most often it is the vertebrae of the neck (cervical spine). The areas of the neck most commonly afflicted by rheumatoid arthritis are the first and second cervical vertebrae. Curiously, rheumatoid arthritis, though it often produces severe deformities, is not always painful.  The more common form of arthritis is osteoarthritis. It is a wear-and-tear form of arthritis. It usually does not produce deformity of the joints or destruction of the bony tissues. Rather the ligaments weaken. They may be calcified due to the body's attempt to heal the damage. The larger joints of the body and those joints that take the most stress and strain are the most often affected. In the neck region this osteoarthritis usually involves the fifth, sixth and seventh vertebrae. This is because the effects of posture produce the most fatigue on them. Osteoarthritis is often more painful than rheumatoid arthritis.  During workups  for arthritis, a test evaluating inflammation, (the sedimentation rate) often is performed. In rheumatoid arthritis, this test will usually be elevated. Other tests for inflammation may also be elevated. In patients with osteoarthritis, x-rays of the neck or jaw joints will show changes from "lipping" of the vertebrae. This is caused by calcium deposits in the ligaments. Or they may show narrowing of the space between the vertebrae, or spur formation (from calcium deposits). If severe, it may cause obstruction of the holes where the nerves pass from the spine to the body. In rheumatoid arthritis, dislocation of vertebrae may occur  in the upper neck. CT scan and MRI in patients with osteoarthritis may show bulging of the discs that cushion the vertebrae. In the most severe cases, herniation of the discs may occur.  Headaches, felt as a pain in the neck, may be caused by arthritis if the first, second or third vertebrae are involved. This condition is due to the nerves that supply the scalp only originating from this area of the spine. Neck pain itself, whether alone or coupled with headaches, can involve any portion of the neck. If the jaw is involved, the symptoms are similar to those of Temporomandibular Joint Syndrome (TMJ).  The progressive severity of rheumatoid arthritis may be slowed by a variety of potent medications. In osteoarthritis, its progression is not usually hindered by medication. The following may be helpful in slowing the advancement of the disorder:  Lifestyle adjustment.  Exercise.  Rest.  Weight loss. Medications, such as the nonsteroidal anti-inflammatory agents (NSAIDs), are useful. They may reduce the pain and improve the reduced motion which occurs in joints afflicted by arthritis. From some studies, the use of acetaminophen appears to be as effective in controlling the pain of arthritis as the NSAIDs. Physical modalities may also be useful for arthritis. They include:  Heat.  Massage.  Exercise. But physical therapy must be prescribed by a caregiver, just as most medications for arthritis.  Document Released: 06/08/2003 Document Revised: 03/23/2013 Document Reviewed: 06/21/2013 [facility] Patient Information 2015 [place], [place]. This information is not intended to replace advice given to you by your health care provider. Make sure you discuss any questions you have with your health care provider.

## 2014-09-22 ENCOUNTER — Encounter: Payer: 59 | Admitting: Gastroenterology

## 2014-12-22 ENCOUNTER — Encounter (HOSPITAL_COMMUNITY): Payer: Self-pay

## 2014-12-22 ENCOUNTER — Emergency Department (HOSPITAL_COMMUNITY)
Admission: EM | Admit: 2014-12-22 | Discharge: 2014-12-22 | Disposition: A | Discharge status: Home / Self Care | Payer: Medicare Other | Attending: Emergency Medicine | Admitting: Emergency Medicine

## 2014-12-22 DIAGNOSIS — Z880 Allergy status to penicillin: Secondary | ICD-10-CM | POA: Insufficient documentation

## 2014-12-22 DIAGNOSIS — Z8673 Personal history of transient ischemic attack (TIA), and cerebral infarction without residual deficits: Secondary | ICD-10-CM | POA: Insufficient documentation

## 2014-12-22 DIAGNOSIS — H9201 Otalgia, right ear: Secondary | ICD-10-CM | POA: Insufficient documentation

## 2014-12-22 DIAGNOSIS — I10 Essential (primary) hypertension: Secondary | ICD-10-CM | POA: Diagnosis not present

## 2014-12-22 DIAGNOSIS — Z79899 Other long term (current) drug therapy: Secondary | ICD-10-CM | POA: Diagnosis not present

## 2014-12-22 NOTE — ED Notes (Signed)
Pt states he was cleaning ears yesterday, and 'might have gone too far'. C/o bilateral ear pain. 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

## 2014-12-22 NOTE — ED Notes (Signed)
Pt presents with onset of R ear pain.  Pt reports he was cleaning his ear last night, and "might have gone in too far" with onset of pain.  Pt reports "fanning" sensation in ear, denies any drainage.

## 2014-12-22 NOTE — Discharge Instructions (Signed)
Avoid using Qtip to clean your ear.  Follow up your doctor as needed.  Return if you developed hearing loss, lightheadedness/dizziness, chest pain, or if you have other concerns.  Otalgia The most common reason for this in children is an infection of the middle ear. Pain from the middle ear is usually caused by a build-up of fluid and pressure behind the eardrum. Pain from an earache can be sharp, dull, or burning. The pain may be temporary or constant. The middle ear is connected to the nasal passages by a short narrow tube called the Eustachian tube. The Eustachian tube allows fluid to drain out of the middle ear, and helps keep the pressure in your ear equalized. CAUSES  A cold or allergy can block the Eustachian tube with inflammation and the build-up of secretions. This is especially likely in small children, because their Eustachian tube is shorter and more horizontal. When the Eustachian tube closes, the normal flow of fluid from the middle ear is stopped. Fluid can accumulate and cause stuffiness, pain, hearing loss, and an ear infection if germs start growing in this area. SYMPTOMS  The symptoms of an ear infection may include fever, ear pain, fussiness, increased crying, and irritability. Many children will have temporary and minor hearing loss during and right after an ear infection. Permanent hearing loss is rare, but the risk increases the more infections a child has. Other causes of ear pain include retained water in the outer ear canal from swimming and bathing. Ear pain in adults is less likely to be from an ear infection. Ear pain may be referred from other locations. Referred pain may be from the joint between your jaw and the skull. It may also come from a tooth problem or problems in the neck. Other causes of ear pain include:  A foreign body in the ear.  Outer ear infection.  Sinus infections.  Impacted ear wax.  Ear injury.  Arthritis of the jaw or TMJ problems.  Middle  ear infection.  Tooth infections.  Sore throat with pain to the ears. DIAGNOSIS  Your caregiver can usually make the diagnosis by examining you. Sometimes other special studies, including x-rays and lab work may be necessary. TREATMENT   If antibiotics were prescribed, use them as directed and finish them even if you or your child's symptoms seem to be improved.  Sometimes PE tubes are needed in children. These are little plastic tubes which are put into the eardrum during a simple surgical procedure. They allow fluid to drain easier and allow the pressure in the middle ear to equalize. This helps relieve the ear pain caused by pressure changes. HOME CARE INSTRUCTIONS   Only take over-the-counter or prescription medicines for pain, discomfort, or fever as directed by your caregiver. DO NOT GIVE CHILDREN ASPIRIN because of the association of Reye's Syndrome in children taking aspirin.  Use a cold pack applied to the outer ear for 15-20 minutes, 03-04 times per day or as needed may reduce pain. Do not apply ice directly to the skin. You may cause frost bite.  Over-the-counter ear drops used as directed may be effective. Your caregiver may sometimes prescribe ear drops.  Resting in an upright position may help reduce pressure in the middle ear and relieve pain.  Ear pain caused by rapidly descending from high altitudes can be relieved by swallowing or chewing gum. Allowing infants to suck on a bottle during airplane travel can help.  Do not smoke in the house or near  children. If you are unable to quit smoking, smoke outside.  Control allergies. SEEK IMMEDIATE MEDICAL CARE IF:   You or your child are becoming sicker.  Pain or fever relief is not obtained with medicine.  You or your child's symptoms (pain, fever, or irritability) do not improve within 24 to 48 hours or as instructed.  Severe pain suddenly stops hurting. This may indicate a ruptured eardrum.  You or your children  develop new problems such as severe headaches, stiff neck, difficulty swallowing, or swelling of the face or around the ear. Document Released: 11/03/2003 Document Revised: 06/10/2011 Document Reviewed: 03/09/2008 [facility] Patient Information 2015 [place], [place]. This information is not intended to replace advice given to you by your health care provider. Make sure you discuss any questions you have with your health care provider.

## 2014-12-22 NOTE — ED Provider Notes (Signed)
CSN: [id number]     Arrival date & time 12/22/14  1206 History  This chart was scribed for non-physician practitioner Fayrene Helper, PA, working with Lavera Guise, MD, by Tanda Rockers, ED Scribe. This patient was seen in room TR05C/TR05C and the patient's care was started at 1:33 PM.   No chief complaint on file.  The history is provided by the patient. No language interpreter was used.     HPI Comments: Luis Oconnor is a 59 y.o. male who presents to the Emergency Department complaining of gradual onset, constant, right ear discomfort that occurred around 3 AM this morning (approximately 10.5 hours ago). Pt states that he was cleaning his ear with a Q tip and believes he went too far, causing the pain. He mentions a fanning sensation in his right ear as well. Pt tried to clean his ear out with a wash cloth and another Q tip after the fanning sensation began but reports he only got a little bit of cerumen out of the ear. Denies pain radiation, chest pain, shortness of breath, lightheadedness, dizziness, or any other associated symptoms. Hx cerebral aneurysm in 2014 with craniotomy.     Past Medical History  Diagnosis Date  . Hypertension   . Ulcer   . Brain aneurysm   . Epistaxis, recurrent   . Stroke     tingling in right thumb (when he had brain aneurysm)  . Acid indigestion   . Seizures     had 2 seizures after brain surgery - Takes Tegretol   Past Surgical History  Procedure Laterality Date  . Craniotomy Right 06/14/2012    Procedure: CRANIOTOMY HEMATOMA EVACUATION SUBDURAL;  Surgeon: [name], MD;  Location: [facility];  Service: Neurosurgery;  Laterality: Right;  . Foot surgery Right   . Fracture surgery Right     ankle  . Skin graft Left     ring finger injury  . Inguinal hernia repair Bilateral 03/17/2014    Procedure: LAPAROSCOPIC BILATERAL INGUINAL HERNIA REPAIR WITH MESH;  Surgeon: Axel Filler, MD;  Location: [facility] OR;  Service: General;  Laterality: Bilateral;   . Insertion of mesh N/A 03/17/2014    Procedure: INSERTION OF MESH;  Surgeon: Axel Filler, MD;  Location: [facility] OR;  Service: General;  Laterality: N/A;   Family History  Problem Relation Age of Onset  . Hypertension Mother   . Heart attack Mother   . Hypertension Father   . Stomach cancer Father   . Cancer Brother     leukemia  . Renal Disease Brother    Social History  Substance Use Topics  . Smoking status: Never Smoker   . Smokeless tobacco: Never Used  . Alcohol Use: No     Comment: formerly drank occ beer. None since brain surgery    Review of Systems  HENT: Ear pain: Right.   Respiratory: Negative for shortness of breath.   Cardiovascular: Negative for chest pain.  Neurological: Negative for dizziness and light-headedness.   Allergies  Penicillins  Home Medications   Prior to Admission medications   Medication Sig Start Date End Date Taking? Authorizing Provider  acetaminophen (TYLENOL) 500 MG tablet Take 250-500 mg by mouth every 6 (six) hours as needed for headache (pain).     Historical Provider, MD  amLODipine (NORVASC) 10 MG tablet Take 10 mg by mouth daily. 07/15/12   Lorre Nick, MD  atenolol (TENORMIN) 25 MG tablet Take 12.5 mg by mouth 2 (two) times daily.  Historical Provider, MD  carbamazepine (TEGRETOL XR) 200 MG 12 hr tablet Take 200 mg by mouth 2 (two) times daily. 07/03/12   Jacquelynn Cree, PA-C  Cetirizine HCl 10 MG CAPS Take 1 capsule (10 mg total) by mouth daily. Patient not taking: Reported on 09/13/2014 05/19/14   Renne Crigler, PA-C  fluticasone [facility]) 50 MCG/ACT nasal spray Place 2 sprays into both nostrils daily. 12/17/13 05/19/14  Sena Hitch, MD  fluticasone (FLONASE) 50 MCG/ACT nasal spray Place 1 spray into both nostrils daily as needed for allergies or rhinitis.    Historical Provider, MD  hydrALAZINE (APRESOLINE) 25 MG tablet Take 25 mg by mouth 4 (four) times daily.  03/29/13   Benjiman Core, MD  lisinopril (PRINIVIL,ZESTRIL) 40  MG tablet Take 80 mg by mouth daily.     Historical Provider, MD  Multiple Vitamins-Minerals (MULTIVITAMIN) tablet Take 0.5 tablets by mouth 2 (two) times daily.  07/03/12   Jacquelynn Cree, PA-C  oxyCODONE-acetaminophen (PERCOCET) 7.5-325 MG per tablet Take 1 tablet by mouth every 4 (four) hours as needed for severe pain. 09/13/14   Nelva Nay, MD  spironolactone (ALDACTONE) 25 MG tablet Take 25 mg by mouth daily.    Historical Provider, MD   Triage Vitals: BP 176/93 mmHg  Pulse 59  Temp(Src) 98 F (36.7 C)  Resp 16  Ht  (1.676 m)  Wt 140 lb (63.504 kg)  BMI 22.61 kg/m2  SpO2 99%   Physical Exam  Constitutional: He is oriented to person, place, and time. He appears well-developed and well-nourished. No distress.  HENT:  Head: Normocephalic and atraumatic.  Bilateral ear canals appear normal No evidence of infection TMs with normal appearance and normal cone of light No pain with ear manipulation No signs of mastoiditis No TNJ  Eyes: Conjunctivae and EOM are normal.  Neck: Neck supple. No tracheal deviation present.  Cardiovascular: Normal rate, regular rhythm and normal heart sounds.  Exam reveals no gallop and no friction rub.   No murmur heard. Pulmonary/Chest: Effort normal and breath sounds normal. No respiratory distress. He has no wheezes. He has no rales. He exhibits no tenderness.  Musculoskeletal: Normal range of motion.  Neurological: He is alert and oriented to person, place, and time.  Skin: Skin is warm and dry.  Psychiatric: He has a normal mood and affect. His behavior is normal.  Nursing note and vitals reviewed.   ED Course  Procedures (including critical care time)  DIAGNOSTIC STUDIES: Oxygen Saturation is 99% on RA, normal by my interpretation.    COORDINATION OF CARE: 1:39 PM- Do not suspect infection or TM perforation.  Doubt cardiac disease causing sxs.  Doubt medication induced. Right ear appears normal at this time. Will give pt referral to ENT  and advised him to follow up if pain persists. Discussed returning to the ED if pt begins having chest pain, dizziness, or diaphoresis. Pt understands and agrees with plan.   Labs Review Labs Reviewed - No data to display  Imaging Review No results found.    EKG Interpretation None      MDM   Final diagnoses:  Acute ear pain, right   BP 176/93 mmHg  Pulse 59  Temp(Src) 98 F (36.7 C)  Resp 16  Ht  (1.676 m)  Wt 140 lb (63.504 kg)  BMI 22.61 kg/m2  SpO2 99% BP elevated, recommend recheck by PCP  I personally performed the services described in this documentation, which was scribed in my presence. The recorded  information has been reviewed and is accurate.       Fayrene Helper, PA-C 12/22/14 1348  Lavera Guise, MD 12/22/14 2008

## 2014-12-31 HISTORY — PX: COLONOSCOPY: SHX174

## 2015-01-05 ENCOUNTER — Other Ambulatory Visit: Payer: Self-pay | Admitting: Gastroenterology

## 2015-01-09 ENCOUNTER — Encounter (HOSPITAL_COMMUNITY): Payer: Self-pay

## 2015-01-09 ENCOUNTER — Emergency Department (HOSPITAL_COMMUNITY): Payer: Medicare Other

## 2015-01-09 ENCOUNTER — Emergency Department (HOSPITAL_COMMUNITY)
Admission: EM | Admit: 2015-01-09 | Discharge: 2015-01-09 | Disposition: A | Discharge status: Home / Self Care | Payer: Medicare Other | Attending: Emergency Medicine | Admitting: Emergency Medicine

## 2015-01-09 DIAGNOSIS — Z79899 Other long term (current) drug therapy: Secondary | ICD-10-CM | POA: Diagnosis not present

## 2015-01-09 DIAGNOSIS — R509 Fever, unspecified: Secondary | ICD-10-CM | POA: Diagnosis not present

## 2015-01-09 DIAGNOSIS — J3489 Other specified disorders of nose and nasal sinuses: Secondary | ICD-10-CM | POA: Insufficient documentation

## 2015-01-09 DIAGNOSIS — R6510 Systemic inflammatory response syndrome (SIRS) of non-infectious origin without acute organ dysfunction: Secondary | ICD-10-CM | POA: Diagnosis not present

## 2015-01-09 DIAGNOSIS — R51 Headache: Secondary | ICD-10-CM | POA: Insufficient documentation

## 2015-01-09 DIAGNOSIS — R5383 Other fatigue: Secondary | ICD-10-CM | POA: Insufficient documentation

## 2015-01-09 DIAGNOSIS — Z7951 Long term (current) use of inhaled steroids: Secondary | ICD-10-CM | POA: Insufficient documentation

## 2015-01-09 DIAGNOSIS — R067 Sneezing: Secondary | ICD-10-CM | POA: Diagnosis not present

## 2015-01-09 DIAGNOSIS — Z880 Allergy status to penicillin: Secondary | ICD-10-CM | POA: Insufficient documentation

## 2015-01-09 DIAGNOSIS — Z8673 Personal history of transient ischemic attack (TIA), and cerebral infarction without residual deficits: Secondary | ICD-10-CM | POA: Insufficient documentation

## 2015-01-09 DIAGNOSIS — I10 Essential (primary) hypertension: Secondary | ICD-10-CM | POA: Diagnosis not present

## 2015-01-09 LAB — CBC WITH DIFFERENTIAL/PLATELET
BASOS ABS: 0 10*3/uL (ref 0.0–0.1)
Basophils Relative: 0 %
EOS ABS: 0 10*3/uL (ref 0.0–0.7)
EOS PCT: 0 %
HCT: 39 % (ref 39.0–52.0)
HEMOGLOBIN: 13.8 g/dL (ref 13.0–17.0)
LYMPHS ABS: 0.2 10*3/uL — AB (ref 0.7–4.0)
LYMPHS PCT: 3 %
MCH: 29.7 pg (ref 26.0–34.0)
MCHC: 35.4 g/dL (ref 30.0–36.0)
MCV: 83.9 fL (ref 78.0–100.0)
Monocytes Absolute: 0.1 10*3/uL (ref 0.1–1.0)
Monocytes Relative: 1 %
NEUTROS PCT: 96 %
Neutro Abs: 6.4 10*3/uL (ref 1.7–7.7)
PLATELETS: 159 10*3/uL (ref 150–400)
RBC: 4.65 MIL/uL (ref 4.22–5.81)
RDW: 12.4 % (ref 11.5–15.5)
WBC: 6.7 10*3/uL (ref 4.0–10.5)

## 2015-01-09 LAB — URINALYSIS, ROUTINE W REFLEX MICROSCOPIC
BILIRUBIN URINE: NEGATIVE
GLUCOSE, UA: NEGATIVE mg/dL
Hgb urine dipstick: NEGATIVE
KETONES UR: NEGATIVE mg/dL
Nitrite: NEGATIVE
PH: 6 (ref 5.0–8.0)
PROTEIN: NEGATIVE mg/dL
Specific Gravity, Urine: 1.02 (ref 1.005–1.030)
Urobilinogen, UA: 0.2 mg/dL (ref 0.0–1.0)

## 2015-01-09 LAB — COMPREHENSIVE METABOLIC PANEL
ALT: 19 U/L (ref 17–63)
AST: 24 U/L (ref 15–41)
Albumin: 3.7 g/dL (ref 3.5–5.0)
Alkaline Phosphatase: 77 U/L (ref 38–126)
Anion gap: 10 (ref 5–15)
BILIRUBIN TOTAL: 0.9 mg/dL (ref 0.3–1.2)
BUN: 17 mg/dL (ref 6–20)
CHLORIDE: 91 mmol/L — AB (ref 101–111)
CO2: 26 mmol/L (ref 22–32)
Calcium: 9 mg/dL (ref 8.9–10.3)
Creatinine, Ser: 1.02 mg/dL (ref 0.61–1.24)
Glucose, Bld: 105 mg/dL — ABNORMAL HIGH (ref 65–99)
POTASSIUM: 3.7 mmol/L (ref 3.5–5.1)
Sodium: 127 mmol/L — ABNORMAL LOW (ref 135–145)
TOTAL PROTEIN: 6.2 g/dL — AB (ref 6.5–8.1)

## 2015-01-09 LAB — URINE MICROSCOPIC-ADD ON

## 2015-01-09 LAB — I-STAT CG4 LACTIC ACID, ED
LACTIC ACID, VENOUS: 2.23 mmol/L — AB (ref 0.5–2.0)
Lactic Acid, Venous: 0.75 mmol/L (ref 0.5–2.0)

## 2015-01-09 LAB — INFLUENZA PANEL BY PCR (TYPE A & B)
H1N1 flu by pcr: NOT DETECTED
Influenza A By PCR: NEGATIVE
Influenza B By PCR: NEGATIVE

## 2015-01-09 MED ORDER — VANCOMYCIN HCL IN DEXTROSE 1-5 GM/200ML-% IV SOLN
1000.0000 mg | Freq: Once | INTRAVENOUS | Status: AC
  Administered 2015-01-09: 1000 mg via INTRAVENOUS
  Filled 2015-01-09: qty 200

## 2015-01-09 MED ORDER — CEPHALEXIN 500 MG PO CAPS: 500.0000 mg | ORAL_CAPSULE | Freq: Two times a day (BID) | ORAL | Status: DC

## 2015-01-09 MED ORDER — SODIUM CHLORIDE 0.9 % IV BOLUS (SEPSIS)
2000.0000 mL | Freq: Once | INTRAVENOUS | Status: AC
  Administered 2015-01-09: 2000 mL via INTRAVENOUS

## 2015-01-09 MED ORDER — DEXTROSE 5 % IV SOLN
2.0000 g | Freq: Three times a day (TID) | INTRAVENOUS | Status: DC
  Administered 2015-01-09: 2 g via INTRAVENOUS
  Filled 2015-01-09 (×2): qty 2

## 2015-01-09 MED ORDER — METRONIDAZOLE IN NACL 5-0.79 MG/ML-% IV SOLN
500.0000 mg | Freq: Once | INTRAVENOUS | Status: AC
  Administered 2015-01-09: 500 mg via INTRAVENOUS
  Filled 2015-01-09: qty 100

## 2015-01-09 NOTE — ED Notes (Signed)
Dr. Nanavati at bedside 

## 2015-01-09 NOTE — ED Notes (Signed)
Patient transported to X-ray 

## 2015-01-09 NOTE — ED Notes (Signed)
Dr. Rhunette Croft aware of lactic acid level.

## 2015-01-09 NOTE — Discharge Instructions (Signed)
We saw you in the ER for the fevers and chills.  We are not sure what is causing your symptoms. The workup in the ER is not complete, and is limited to screening for life threatening and emergent conditions only, so please see a primary care doctor for further evaluation.  Please return to the ER if your symptoms worsen; you have increased pain, fevers, chills, inability to keep any medications down, confusion, abdominal pain. Otherwise see the outpatient doctor as requested.   Fever, Adult A fever is an increase in the body's temperature. It is usually defined as a temperature of 100F (38C) or higher. Brief mild or moderate fevers generally have no long-term effects, and they often do not require treatment. Moderate or high fevers may make you feel uncomfortable and can sometimes be a sign of a serious illness or disease. The sweating that may occur with repeated or prolonged fever may also cause dehydration. Fever is confirmed by taking a temperature with a thermometer. A measured temperature can vary with:  Age.  Time of day.  Location of the thermometer:  Mouth (oral).  Rectum (rectal).  Ear (tympanic).  Underarm (axillary).  Forehead (temporal). HOME CARE INSTRUCTIONS Pay attention to any changes in your symptoms. Take these actions to help with your condition:  Take over-the counter and prescription medicines only as told by your health care provider. Follow the dosing instructions carefully.  If you were prescribed an antibiotic medicine, take it as told by your health care provider. Do not stop taking the antibiotic even if you start to feel better.  Rest as needed.  Drink enough fluid to keep your urine clear or pale yellow. This helps to prevent dehydration.  Sponge yourself or bathe with room-temperature water to help reduce your body temperature as needed. Do not use ice water.  Do not overbundle yourself in blankets or heavy clothes. SEEK MEDICAL CARE  IF:  You vomit.  You cannot eat or drink without vomiting.  You have diarrhea.  You have pain when you urinate.  Your symptoms do not improve with treatment.  You develop new symptoms.  You develop excessive weakness. SEEK IMMEDIATE MEDICAL CARE IF:  You have shortness of breath or have trouble breathing.  You are dizzy or you faint.  You are disoriented or confused.  You develop signs of dehydration, such as a dry mouth, decreased urination, or paleness.  You develop severe pain in your abdomen.  You have persistent vomiting or diarrhea.  You develop a skin rash.  Your symptoms suddenly get worse.   This information is not intended to replace advice given to you by your health care provider. Make sure you discuss any questions you have with your health care provider.   Document Released: 09/11/2000 Document Revised: 12/07/2014 Document Reviewed: 05/12/2014 [employer] Interactive Patient Education [employer]

## 2015-01-09 NOTE — ED Notes (Signed)
Per EMS: Pt had a polyp removed from colon on Thursday. Since pt has begun feeling gernal malaise, runny nose and feverish. EMS got temp of 105.73F. Gave 1,000mg  tylenol. Temp upon arrival = 102.1. BP 150/96, 96bpm.

## 2015-01-09 NOTE — ED Provider Notes (Signed)
CSN: [id number]   Arrival date & time 01/09/15 0051  History  By signing my name below, I, Bethel Born, attest that this documentation has been prepared under the direction and in the presence of Derwood Kaplan, MD. Electronically Signed: Bethel Born, ED Scribe. 01/09/2015. 1:52 AM.  Chief Complaint  Patient presents with  . Fever  . Fatigue    HPI The history is provided by the patient. No language interpreter was used.   Luis Oconnor is a 59 y.o. male who is 4 days s/p polypectomy who presents to the Emergency Department complaining of chills with onset around 8 PM. Associated symptoms include shivering and feeling warm to the touch. Since his procedure he has had mild headache, sneezing and rhinorrhea. Pt did not measure his temperature PTA but had a temperature of 105.7 for EMS (given 1,000 mg of Tylenol).  He had 2 normal bowel movements this evening. Pt denies new neck stiffness or pain, cough, SOB, chest pain, rash, abdominal pain, vomiting, diarrhea, and hematochezia.  No recent travel, camping, or ingestion of suspicious water. No personal history of cancer but he notes family history of leukemia. No history of IV drug use.  Marland Kitchen  Past Medical History  Diagnosis Date  . Hypertension   . Ulcer   . Brain aneurysm   . Epistaxis, recurrent   . Stroke (HCC)     tingling in right thumb (when he had brain aneurysm)  . Acid indigestion   . Seizures (HCC)     had 2 seizures after brain surgery - Takes Tegretol    Past Surgical History  Procedure Laterality Date  . Craniotomy Right 06/14/2012    Procedure: CRANIOTOMY HEMATOMA EVACUATION SUBDURAL;  Surgeon: [name], MD;  Location: [facility];  Service: Neurosurgery;  Laterality: Right;  . Foot surgery Right   . Fracture surgery Right     ankle  . Skin graft Left     ring finger injury  . Inguinal hernia repair Bilateral 03/17/2014    Procedure: LAPAROSCOPIC BILATERAL INGUINAL HERNIA REPAIR WITH MESH;  Surgeon:  Axel Filler, MD;  Location: [facility] OR;  Service: General;  Laterality: Bilateral;  . Insertion of mesh N/A 03/17/2014    Procedure: INSERTION OF MESH;  Surgeon: Axel Filler, MD;  Location: [facility] OR;  Service: General;  Laterality: N/A;    Family History  Problem Relation Age of Onset  . Hypertension Mother   . Heart attack Mother   . Hypertension Father   . Stomach cancer Father   . Cancer Brother     leukemia  . Renal Disease Brother     Social History  Substance Use Topics  . Smoking status: Never Smoker   . Smokeless tobacco: Never Used  . Alcohol Use: No     Comment: formerly drank occ beer. None since brain surgery     Review of Systems  Constitutional: Positive for fever and chills.  HENT: Positive for rhinorrhea and sneezing.   Cardiovascular: Negative for chest pain.  Gastrointestinal: Negative for nausea and vomiting.  Neurological: Positive for headaches. Negative for weakness.  All other systems reviewed and are negative.  Home Medications   Prior to Admission medications   Medication Sig Start Date End Date Taking? Authorizing Provider  acetaminophen (TYLENOL) 500 MG tablet Take 500 mg by mouth every 6 (six) hours as needed for headache (pain).    Yes Historical Provider, MD  amLODipine (NORVASC) 10 MG tablet Take 10 mg by mouth daily. 07/15/12  Yes Lorre Nick, MD  atenolol (TENORMIN) 25 MG tablet Take 25 mg by mouth 2 (two) times daily.    Yes Historical Provider, MD  carbamazepine (TEGRETOL) 200 MG tablet Take 200 mg by mouth 2 (two) times daily.   Yes Historical Provider, MD  fluticasone (FLONASE) 50 MCG/ACT nasal spray Place 1 spray into both nostrils daily as needed for allergies or rhinitis.   Yes Historical Provider, MD  hydrALAZINE (APRESOLINE) 25 MG tablet Take 25 mg by mouth 4 (four) times daily.  03/29/13  Yes Benjiman Core, MD  lisinopril (PRINIVIL,ZESTRIL) 40 MG tablet Take 80 mg by mouth daily.    Yes Historical Provider, MD  Multiple  Vitamins-Minerals (MULTIVITAMIN) tablet Take 0.5 tablets by mouth 2 (two) times daily.  07/03/12  Yes Evlyn Kanner Love, PA-C  spironolactone (ALDACTONE) 25 MG tablet Take 25 mg by mouth daily.   Yes Historical Provider, MD  cephALEXin (KEFLEX) 500 MG capsule Take 1 capsule (500 mg total) by mouth 2 (two) times daily. 01/09/15   Derwood Kaplan, MD  Cetirizine HCl 10 MG CAPS Take 1 capsule (10 mg total) by mouth daily. Patient not taking: Reported on 09/13/2014 05/19/14   Renne Crigler, PA-C  fluticasone [facility]) 50 MCG/ACT nasal spray Place 2 sprays into both nostrils daily. 12/17/13 05/19/14  Sena Hitch, MD  oxyCODONE-acetaminophen (PERCOCET) 7.5-325 MG per tablet Take 1 tablet by mouth every 4 (four) hours as needed for severe pain. Patient not taking: Reported on 01/09/2015 09/13/14   Nelva Nay, MD    Allergies  Penicillins  Triage Vitals: BP 152/93 mmHg  Pulse 89  Resp 18  Ht  (1.676 m)  Wt 132 lb 9.6 oz (60.147 kg)  BMI 21.41 kg/m2  SpO2 95%  Physical Exam  Constitutional: He is oriented to person, place, and time. He appears well-developed and well-nourished.  HENT:  Head: Normocephalic.  Eyes: EOM are normal.  Neck: Normal range of motion.  No meningismus  Cardiovascular: Normal rate and regular rhythm.   Heart rate is 88 2+ and equal radial pulses  Pulmonary/Chest: Effort normal.  CTAB  Abdominal: Soft. He exhibits no distension. There is no tenderness.  Musculoskeletal: Normal range of motion.  No LE swelling or pitting edema  Neurological: He is alert and oriented to person, place, and time.  Psychiatric: He has a normal mood and affect.  Nursing note and vitals reviewed.   ED Course  Procedures   DIAGNOSTIC STUDIES: Oxygen Saturation is 95% on RA, normal by my interpretation.    COORDINATION OF CARE: 1:51 AM Discussed treatment plan which includes lab work, CXR, and EKG  with pt at bedside and pt agreed to plan.  Labs Reviewed  COMPREHENSIVE METABOLIC  PANEL - Abnormal; Notable for the following:    Sodium 127 (*)    Chloride 91 (*)    Glucose, Bld 105 (*)    Total Protein 6.2 (*)    All other components within normal limits  CBC WITH DIFFERENTIAL/PLATELET - Abnormal; Notable for the following:    Lymphs Abs 0.2 (*)    All other components within normal limits  URINALYSIS, ROUTINE W REFLEX MICROSCOPIC (NOT AT [facility]) - Abnormal; Notable for the following:    Leukocytes, UA SMALL (*)    All other components within normal limits  URINE MICROSCOPIC-ADD ON - Abnormal; Notable for the following:    Bacteria, UA FEW (*)    All other components within normal limits  I-STAT CG4 LACTIC ACID, ED - Abnormal; Notable for  the following:    Lactic Acid, Venous 2.23 (*)    All other components within normal limits  CULTURE, BLOOD (ROUTINE X 2)  CULTURE, BLOOD (ROUTINE X 2)  URINE CULTURE  INFLUENZA PANEL BY PCR (TYPE A & B, H1N1)(NOT AT [facility])  I-STAT CG4 LACTIC ACID, ED  I-STAT CG4 LACTIC ACID, ED    Imaging Review Dg Chest 2 View  01/09/2015   CLINICAL DATA:  Initial evaluation for acute sepsis.  Fever.  EXAM: CHEST  2 VIEW  COMPARISON:  Prior radiograph from 03/29/2013.  FINDINGS: Extends to a shin of the cardiac silhouette related to AP technique and shallow lung inflation. Mild cardiomegaly is likely stable. Mediastinal silhouette within normal limits.  Lungs are hypoinflated. Linear opacity along the left hemidiaphragm most consistent with atelectasis. No definite focal infiltrates. No pneumothorax. No pulmonary edema or pleural effusion.  No acute osseus abnormality.  IMPRESSION: Shallow lung inflation with patchy and linear left basilar opacity, most consistent with atelectasis. No other active cardiopulmonary disease.   Electronically Signed   By: Rise Mu M.D.   On: 01/09/2015 02:12     5:48 AM Pt is feeling a lot better. Temp has decreased. HR improved. WC is normal. UA has WBC +++. Still has no abd tenderness. ? Increased  urinary frequency. Tremors resolved. Pt wants to go home - with 4 hours of monitoring, i am comfortable with the idea. He will go home with keflex. We will ensure lactate is not rising. Strict return precautions have been discussed.     MDM   Final diagnoses:  Fever chills  SIRS (systemic inflammatory response syndrome) (HCC)    Pt comes in with cc of fevers and chills. Recent colonoscopy. 2 SIRs at arrival. No abd pain. Head to toe exam doesn't reveal any source of infection besides flu like sx.. Pt is immunocompetent. - sepsis workup initiated. - ddx: sepsis syndrome 2/2 bacteremia, flu, cancer. - Pt looks non toxic. He denies any travel hx, significant weight loss recently. Colonoscopy was benign. - Will reassess post labs and decide on dispo.    Derwood Kaplan, MD 01/09/15 [phone]

## 2015-01-11 LAB — URINE CULTURE: Culture: 60000

## 2015-01-12 ENCOUNTER — Telehealth: Payer: Self-pay | Admitting: *Deleted

## 2015-01-12 ENCOUNTER — Telehealth (HOSPITAL_COMMUNITY): Payer: Self-pay

## 2015-01-12 NOTE — Progress Notes (Signed)
ED Antimicrobial Stewardship Positive Culture Follow Up   Luis Oconnor is an 59 y.o. male who [facility] with a chief complaint of  Chief Complaint  Patient presents with  . Fever  . Fatigue    Recent Results (from the past 720 hour(s))  Blood Culture (routine x 2)     Status: None (Preliminary result)   Collection Time: 01/09/15  1:20 AM  Result Value Ref Range Status   Specimen Description BLOOD RIGHT ANTECUBITAL  Final   Special Requests BOTTLES DRAWN AEROBIC AND ANAEROBIC 5ML  Final   Culture NO GROWTH 2 DAYS  Final   Report Status PENDING  Incomplete  Blood Culture (routine x 2)     Status: None (Preliminary result)   Collection Time: 01/09/15  1:24 AM  Result Value Ref Range Status   Specimen Description BLOOD LEFT ARM  Final   Special Requests BOTTLES DRAWN AEROBIC AND ANAEROBIC 5CC  Final   Culture NO GROWTH 2 DAYS  Final   Report Status PENDING  Incomplete  Urine culture     Status: None   Collection Time: 01/09/15  1:42 AM  Result Value Ref Range Status   Specimen Description URINE, RANDOM  Final   Special Requests NONE  Final   Culture 60,000 COLONIES/ml ENTEROBACTER AEROGENES  Final   Report Status 01/11/2015 FINAL  Final   Organism ID, Bacteria ENTEROBACTER AEROGENES  Final      Susceptibility   Enterobacter aerogenes - MIC*    CEFAZOLIN >=64 RESISTANT Resistant     CEFTRIAXONE <=1 SENSITIVE Sensitive     CIPROFLOXACIN <=0.25 SENSITIVE Sensitive     GENTAMICIN 4 SENSITIVE Sensitive     IMIPENEM <=0.25 SENSITIVE Sensitive     NITROFURANTOIN 32 SENSITIVE Sensitive     TRIMETH/SULFA <=20 SENSITIVE Sensitive     PIP/TAZO <=4 SENSITIVE Sensitive     * 60,000 COLONIES/ml ENTEROBACTER AEROGENES    [x]  Treated with Keflex, organism resistant to prescribed antimicrobial  New antibiotic prescription: Bactrim DS po 1 tab BID x 14 days  ED Provider: Wynetta EmeryNicole Pisciotta PA-C  59 y/o M with fever and fatigue. SIRS on arrival but pt  appeared nontoxic on discharge. Denied abdominal pain or other UTI signs other than some urinary frequency. S/p 4 days polypectomy.  Clean catch urine cx grew enterobacter resistant to Keflex. Will d/c Keflex and start Bactrim.  Sandi CarneNick Assyria Morreale, PharmD Pharmacy Resident Pager: [phone] 01/12/2015, 9:03 AM Infectious Diseases Pharmacist Phone# [phone]

## 2015-01-12 NOTE — Telephone Encounter (Signed)
Post ED Visit - Positive Culture Follow-up: Successful Patient Follow-Up  Culture assessed and recommendations reviewed by: []  Celedonio MiyamotoJeremy Frens, Pharm.D., BCPS-AQ ID []  Georgina PillionElizabeth Martin, [address]Ph[address] BCPS []  HodgenvilleMinh Pham, [address]Pharm[address]D., BCPS, AAHIVP []  Estella HuskMichelle Turner, Pharm.D., BCPS, AAHIVP []  Cristy Reyes, [address]Ph[address]D. []  Tennis Mustassie Stewart, [place] Gary FleetX  Nicholas Gazda, Pharm.D.  Positive urine culture, 60,000 colonies -> enterobacter aerogenes  []  Patient discharged without antimicrobial prescription and treatment is now indicated [x]  Organism is resistant to prescribed ED discharge antimicrobial, Cephalexin []  Patient with positive blood cultures  Changes discussed with ED provider: N. Pisciotta PA  New antibiotic prescription "D/C Keflex, Bactrim DS 1 tab pod BID x 14 days"  Contacted patient, date 01/12/2015, time 14:27 Left message for pt to return call.     Luis Oconnor, Luis Oconnor 01/12/2015, 2:29 PM

## 2015-01-12 NOTE — ED Notes (Signed)
Informed patient of (+)blood culture and need to return to ED for further evaluation.  Verbalized understanding and states will return in am of 01/13/15.

## 2015-01-13 ENCOUNTER — Encounter (HOSPITAL_COMMUNITY): Payer: Self-pay | Admitting: Nurse Practitioner

## 2015-01-13 ENCOUNTER — Inpatient Hospital Stay (HOSPITAL_COMMUNITY)
Admission: EM | Admit: 2015-01-13 | Discharge: 2015-01-14 | DRG: 690 | Disposition: A | Discharge status: Home / Self Care | Payer: Medicare Other | Attending: Internal Medicine | Admitting: Internal Medicine

## 2015-01-13 ENCOUNTER — Emergency Department (HOSPITAL_COMMUNITY): Payer: Medicare Other

## 2015-01-13 DIAGNOSIS — I10 Essential (primary) hypertension: Secondary | ICD-10-CM | POA: Diagnosis not present

## 2015-01-13 DIAGNOSIS — Z806 Family history of leukemia: Secondary | ICD-10-CM | POA: Diagnosis not present

## 2015-01-13 DIAGNOSIS — B9689 Other specified bacterial agents as the cause of diseases classified elsewhere: Secondary | ICD-10-CM | POA: Diagnosis present

## 2015-01-13 DIAGNOSIS — Z800 Family history of malignant neoplasm of digestive organs: Secondary | ICD-10-CM

## 2015-01-13 DIAGNOSIS — Z8673 Personal history of transient ischemic attack (TIA), and cerebral infarction without residual deficits: Secondary | ICD-10-CM | POA: Diagnosis not present

## 2015-01-13 DIAGNOSIS — R7881 Bacteremia: Secondary | ICD-10-CM | POA: Diagnosis present

## 2015-01-13 DIAGNOSIS — I119 Hypertensive heart disease without heart failure: Secondary | ICD-10-CM | POA: Diagnosis present

## 2015-01-13 DIAGNOSIS — Z880 Allergy status to penicillin: Secondary | ICD-10-CM

## 2015-01-13 DIAGNOSIS — G40909 Epilepsy, unspecified, not intractable, without status epilepticus: Secondary | ICD-10-CM | POA: Diagnosis present

## 2015-01-13 DIAGNOSIS — Z8249 Family history of ischemic heart disease and other diseases of the circulatory system: Secondary | ICD-10-CM | POA: Diagnosis not present

## 2015-01-13 DIAGNOSIS — Z79899 Other long term (current) drug therapy: Secondary | ICD-10-CM | POA: Diagnosis not present

## 2015-01-13 DIAGNOSIS — E871 Hypo-osmolality and hyponatremia: Secondary | ICD-10-CM | POA: Diagnosis not present

## 2015-01-13 DIAGNOSIS — K219 Gastro-esophageal reflux disease without esophagitis: Secondary | ICD-10-CM | POA: Diagnosis present

## 2015-01-13 DIAGNOSIS — N390 Urinary tract infection, site not specified: Principal | ICD-10-CM

## 2015-01-13 HISTORY — DX: Gastro-esophageal reflux disease without esophagitis: K21.9

## 2015-01-13 HISTORY — DX: Unspecified cataract: H26.9

## 2015-01-13 LAB — I-STAT CG4 LACTIC ACID, ED
Lactic Acid, Venous: 0.95 mmol/L (ref 0.5–2.0)
Lactic Acid, Venous: 0.49 mmol/L — ABNORMAL LOW (ref 0.5–2.0)

## 2015-01-13 LAB — COMPREHENSIVE METABOLIC PANEL
ALBUMIN: 3.3 g/dL — AB (ref 3.5–5.0)
ALT: 17 U/L (ref 17–63)
ANION GAP: 6 (ref 5–15)
AST: 15 U/L (ref 15–41)
Alkaline Phosphatase: 61 U/L (ref 38–126)
BILIRUBIN TOTAL: 0.3 mg/dL (ref 0.3–1.2)
BUN: 10 mg/dL (ref 6–20)
CALCIUM: 8.8 mg/dL — AB (ref 8.9–10.3)
CHLORIDE: 96 mmol/L — AB (ref 101–111)
CO2: 28 mmol/L (ref 22–32)
Creatinine, Ser: 0.84 mg/dL (ref 0.61–1.24)
GFR calc Af Amer: >60 mL/min (ref >60)
GFR calc non Af Amer: >60 mL/min (ref >60)
GLUCOSE: 100 mg/dL — AB (ref 65–99)
POTASSIUM: 4.1 mmol/L (ref 3.5–5.1)
SODIUM: 130 mmol/L — AB (ref 135–145)
TOTAL PROTEIN: 6.4 g/dL — AB (ref 6.5–8.1)

## 2015-01-13 LAB — CBC
HCT: 35.2 % — ABNORMAL LOW (ref 39.0–52.0)
HEMOGLOBIN: 12.1 g/dL — AB (ref 13.0–17.0)
MCH: 29.4 pg (ref 26.0–34.0)
MCHC: 34.4 g/dL (ref 30.0–36.0)
MCV: 85.6 fL (ref 78.0–100.0)
Platelets: 180 10*3/uL (ref 150–400)
RBC: 4.11 MIL/uL — ABNORMAL LOW (ref 4.22–5.81)
RDW: 12.6 % (ref 11.5–15.5)
WBC: 5.6 10*3/uL (ref 4.0–10.5)

## 2015-01-13 LAB — URINALYSIS, ROUTINE W REFLEX MICROSCOPIC
Bilirubin Urine: NEGATIVE
GLUCOSE, UA: NEGATIVE mg/dL
Hgb urine dipstick: NEGATIVE
KETONES UR: NEGATIVE mg/dL
LEUKOCYTES UA: NEGATIVE
Nitrite: NEGATIVE
PH: 7 (ref 5.0–8.0)
Protein, ur: NEGATIVE mg/dL
SPECIFIC GRAVITY, URINE: 1.006 (ref 1.005–1.030)
Urobilinogen, UA: 0.2 mg/dL (ref 0.0–1.0)

## 2015-01-13 MED ORDER — ACETAMINOPHEN 325 MG PO TABS
650.0000 mg | ORAL_TABLET | Freq: Four times a day (QID) | ORAL | Status: DC | PRN
  Administered 2015-01-13: 650 mg via ORAL
  Filled 2015-01-13: qty 2

## 2015-01-13 MED ORDER — SODIUM CHLORIDE 0.9 % IV SOLN
INTRAVENOUS | Status: AC
  Administered 2015-01-13: 18:00:00 via INTRAVENOUS
  Filled 2015-01-13: qty 1000

## 2015-01-13 MED ORDER — AMLODIPINE BESYLATE 10 MG PO TABS
10.0000 mg | ORAL_TABLET | Freq: Every day | ORAL | Status: DC
  Administered 2015-01-14: 10 mg via ORAL
  Filled 2015-01-13 (×2): qty 1

## 2015-01-13 MED ORDER — VANCOMYCIN HCL 10 G IV SOLR
1250.0000 mg | Freq: Once | INTRAVENOUS | Status: AC
  Administered 2015-01-13: 1250 mg via INTRAVENOUS
  Filled 2015-01-13: qty 1250

## 2015-01-13 MED ORDER — VANCOMYCIN HCL IN DEXTROSE 750-5 MG/150ML-% IV SOLN
750.0000 mg | Freq: Two times a day (BID) | INTRAVENOUS | Status: DC
  Administered 2015-01-14: 750 mg via INTRAVENOUS
  Filled 2015-01-13 (×3): qty 150

## 2015-01-13 MED ORDER — SPIRONOLACTONE 25 MG PO TABS
25.0000 mg | ORAL_TABLET | Freq: Every day | ORAL | Status: DC
  Administered 2015-01-13 – 2015-01-14 (×2): 25 mg via ORAL
  Filled 2015-01-13: qty 1

## 2015-01-13 MED ORDER — ONDANSETRON HCL 4 MG PO TABS
4.0000 mg | ORAL_TABLET | Freq: Four times a day (QID) | ORAL | Status: DC | PRN
Start: 2015-01-13 — End: 2015-01-14

## 2015-01-13 MED ORDER — SODIUM CHLORIDE 0.9 % IV BOLUS (SEPSIS)
1000.0000 mL | Freq: Once | INTRAVENOUS | Status: AC
  Administered 2015-01-13: 1000 mL via INTRAVENOUS

## 2015-01-13 MED ORDER — ENOXAPARIN SODIUM 40 MG/0.4ML ~~LOC~~ SOLN
40.0000 mg | SUBCUTANEOUS | Status: DC
  Administered 2015-01-13: 40 mg via SUBCUTANEOUS
  Filled 2015-01-13: qty 0.4

## 2015-01-13 MED ORDER — DEXTROSE 5 % IV SOLN
1.0000 g | INTRAVENOUS | Status: DC
  Administered 2015-01-13: 1 g via INTRAVENOUS
  Filled 2015-01-13 (×3): qty 10

## 2015-01-13 MED ORDER — POLYVINYL ALCOHOL 1.4 % OP SOLN
1.0000 [drp] | OPHTHALMIC | Status: DC | PRN
  Filled 2015-01-13: qty 15

## 2015-01-13 MED ORDER — ATENOLOL 25 MG PO TABS
25.0000 mg | ORAL_TABLET | Freq: Two times a day (BID) | ORAL | Status: DC
  Administered 2015-01-13 – 2015-01-14 (×2): 25 mg via ORAL
  Filled 2015-01-13 (×2): qty 1

## 2015-01-13 MED ORDER — CARBAMAZEPINE 200 MG PO TABS
200.0000 mg | ORAL_TABLET | Freq: Two times a day (BID) | ORAL | Status: DC
  Administered 2015-01-13 – 2015-01-14 (×2): 200 mg via ORAL
  Filled 2015-01-13 (×2): qty 1

## 2015-01-13 MED ORDER — FLUTICASONE PROPIONATE 50 MCG/ACT NA SUSP
1.0000 | Freq: Every day | NASAL | Status: DC | PRN
  Filled 2015-01-13: qty 16

## 2015-01-13 MED ORDER — PROPYLENE GLYCOL 0.6 % OP SOLN
2.0000 [drp] | Freq: Every morning | OPHTHALMIC | Status: DC
Start: 2015-01-14 — End: 2015-01-13

## 2015-01-13 MED ORDER — ONDANSETRON HCL 4 MG/2ML IJ SOLN: 4.0000 mg | Freq: Four times a day (QID) | INTRAMUSCULAR | Status: DC | PRN

## 2015-01-13 MED ORDER — LISINOPRIL 40 MG PO TABS
40.0000 mg | ORAL_TABLET | Freq: Every day | ORAL | Status: DC
  Administered 2015-01-13 – 2015-01-14 (×2): 40 mg via ORAL
  Filled 2015-01-13 (×2): qty 1

## 2015-01-13 MED ORDER — PROPYLENE GLYCOL 0.6 % OP SOLN: 2.0000 [drp] | Freq: Every morning | OPHTHALMIC | Status: DC

## 2015-01-13 MED ORDER — ACETAMINOPHEN 650 MG RE SUPP
650.0000 mg | Freq: Four times a day (QID) | RECTAL | Status: DC | PRN
Start: 2015-01-13 — End: 2015-01-14
  Filled 2015-01-13: qty 1

## 2015-01-13 MED ORDER — HYDRALAZINE HCL 25 MG PO TABS
25.0000 mg | ORAL_TABLET | Freq: Four times a day (QID) | ORAL | Status: DC
  Administered 2015-01-13 – 2015-01-14 (×3): 25 mg via ORAL
  Filled 2015-01-13 (×3): qty 1

## 2015-01-13 MED ORDER — INFLUENZA VAC SPLIT QUAD 0.5 ML IM SUSY
0.5000 mL | PREFILLED_SYRINGE | INTRAMUSCULAR | Status: AC
  Administered 2015-01-14: 0.5 mL via INTRAMUSCULAR
  Filled 2015-01-13: qty 0.5

## 2015-01-13 NOTE — Progress Notes (Signed)
ANTIBIOTIC CONSULT NOTE - INITIAL  Pharmacy Consult for Vancomycin Indication: Bacteremia  Allergies  Allergen Reactions  . Penicillins     unknown    Patient Measurements: TBW is 60 kg  Vital Signs: Temp: 98.8 F (37.1 C) (10/14 1134) Temp Source: Oral (10/14 1134) BP: 155/80 mmHg (10/14 1432) Pulse Rate: 56 (10/14 1432) Intake/Output from previous day:   Intake/Output from this shift:    Labs:  Recent Labs  01/13/15 1200  WBC 5.6  HGB 12.1*  PLT 180  CREATININE 0.84   Estimated Creatinine Clearance: 78.5 mL/min (by C-G formula based on Cr of 0.84). No results for input(s): VANCOTROUGH, VANCOPEAK, VANCORANDOM, GENTTROUGH, GENTPEAK, GENTRANDOM, TOBRATROUGH, TOBRAPEAK, TOBRARND, AMIKACINPEAK, AMIKACINTROU, AMIKACIN in the last 72 hours.   Microbiology: Recent Results (from the past 720 hour(s))  Blood Culture (routine x 2)     Status: None (Preliminary result)   Collection Time: 01/09/15  1:20 AM  Result Value Ref Range Status   Specimen Description BLOOD RIGHT ANTECUBITAL  Final   Special Requests BOTTLES DRAWN AEROBIC AND ANAEROBIC 5ML  Final   Culture NO GROWTH 4 DAYS  Final   Report Status PENDING  Incomplete  Blood Culture (routine x 2)     Status: None (Preliminary result)   Collection Time: 01/09/15  1:24 AM  Result Value Ref Range Status   Specimen Description BLOOD LEFT ARM  Final   Special Requests BOTTLES DRAWN AEROBIC AND ANAEROBIC 5CC  Final   Culture  Setup Time   Final    GRAM POSITIVE COCCI IN CLUSTERS AEROBIC BOTTLE ONLY CRITICAL RESULT CALLED TO, READ BACK BY AND VERIFIED WITH: K ROBERTSON RN 2002 01/12/15 A BROWNING    Culture GRAM POSITIVE COCCI  Final   Report Status PENDING  Incomplete  Urine culture     Status: None   Collection Time: 01/09/15  1:42 AM  Result Value Ref Range Status   Specimen Description URINE, RANDOM  Final   Special Requests NONE  Final   Culture 60,000 COLONIES/ml ENTEROBACTER AEROGENES  Final   Report Status  01/11/2015 FINAL  Final   Organism ID, Bacteria ENTEROBACTER AEROGENES  Final      Susceptibility   Enterobacter aerogenes - MIC*    CEFAZOLIN >=64 RESISTANT Resistant     CEFTRIAXONE <=1 SENSITIVE Sensitive     CIPROFLOXACIN <=0.25 SENSITIVE Sensitive     GENTAMICIN 4 SENSITIVE Sensitive     IMIPENEM <=0.25 SENSITIVE Sensitive     NITROFURANTOIN 32 SENSITIVE Sensitive     TRIMETH/SULFA <=20 SENSITIVE Sensitive     PIP/TAZO <=4 SENSITIVE Sensitive     * 60,000 COLONIES/ml ENTEROBACTER AEROGENES    Medical History: Past Medical History  Diagnosis Date  . Hypertension   . Ulcer   . Brain aneurysm   . Epistaxis, recurrent   . Stroke (HCC)     tingling in right thumb (when he had brain aneurysm)  . Acid indigestion   . Seizures (HCC)     had 2 seizures after brain surgery - Takes Tegretol    Assessment: 59 yo M presents to the ED on 10/14 after being called to come in due to positive blood cultures from when he had chills on Sunday here. Afebrile, WBC wnl. SCr stable, CrCl ~7175ml/min. Redrew cultures today.  Goal of Therapy:  Vancomycin trough level 15-20 mcg/ml  Resolution of infection  Plan:  Give vancomycin 1.25g IV x 1, then start vancomycin 750mg  IV Q12 Monitor clinical picture, renal function, VT at Css  F/U C&S, abx deescalation / LOT  Loreal Schuessler J 01/13/2015,3:29 PM

## 2015-01-13 NOTE — H&P (Signed)
History and Physical  Luis Oconnor ZOX:[id number] DOB: Dec 14, 1955 DOA: 01/13/2015   PCP: Willey Blade, MD  Referring Physician: ED/ Cheri Fowler, PA-C  Chief Complaint: fever and chills  HPI:  59 year old male with a history of hypertension, hemorrhagic stroke status post aneurysm clipping, GERD, seizure disorder presented to the emergency department initially on 01/09/2015 with 3-4 day history of fevers and chills. The patient had a colonoscopy performed by Dr. Evette Cristal on 01/05/2015. The colonoscopy was performed due to intermittent hematochezia. Per the patient, the colonoscopy was unremarkable except for a polyp. 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 During his emergency department visit on 01/09/2015, blood cultures were obtained. The patient was discharged home with cephalexin. Urinalysis was obtained at that time and revealed pyuria with 21-50 WBCs. The patient's blood cultures obtained on 01/09/2015 became positive growing gram-positive cocci in clusters. As result, the patient was contacted to come back to the hospital for admission for intravenous antibiotics. Notably, the patient states that his fevers and chills overall have improved. He denies any headaches, neck pain, chest discomfort, shortness of breath, nausea, vomiting, diarrhea, vomiting, dysuria, hematuria, hematochezia, melena. There is no unusual rashes or synovitis.  On 01/09/2015, the patient did have a lactic acid of 2.23. In the emergency department on 01/13/2015, sodium was 1:30, lactic acid 0.95. CBC was unremarkable. Hepatic enzymes were unremarkable. EKG shows sinus rhythm with nonspecific T-wave changes. Assessment/Plan: Bacteremia -Suspect that this is a contaminant as it took the blood culture 3 days to become positive -Nevertheless, the patient will be admitted for intravenous vancomycin pending final culture susceptibility data -Certainly, if the culture shows MRSA, further workup will need  to be undertaken UTI -This likely is attributing to the patient's fevers and chills. He had significant pyuria with urine culture that grew Enterobacter aerogenes -Start the patient on ceftriaxone; based upon the patient's culture data, he will likely be able to go home on oral antibiotics Fevers and chills -The patient is afebrile at this point and hemodynamically stable without leukocytosis. -Lactic acid is unremarkable -Chest x-ray is negative for acute findings Hypertension -Continue amlodipine, atenolol, hydralazine, Aldactone -Adjust the patient's lisinopril dose to 40 mg daily; 80 mg dose will not add any additional therapeutic benefit Hyponatremia -This is multifactorial, but most likely due to the patient's medications including lisinopril, Aldactone, and carbamazepine all of which are contributing to his hyponatremia  -Give the patient a fluid challenge of 1 L--he does appear hypovolemic clinically -certainly, if the patient's hyponatremia continues to worsen further workup will need to be taken  History of hemorrhagic stroke in 2014 /Seizure disorder -Continue carbamazepine -Status post aneurysm clipping        Past Medical History  Diagnosis Date  . Hypertension   . Ulcer   . Brain aneurysm   . Epistaxis, recurrent   . Stroke (HCC)     tingling in right thumb (when he had brain aneurysm)  . Acid indigestion   . Seizures (HCC)     had 2 seizures after brain surgery - Takes Tegretol   Past Surgical History  Procedure Laterality Date  . Craniotomy Right 06/14/2012    Procedure: CRANIOTOMY HEMATOMA EVACUATION SUBDURAL;  Surgeon: [name], MD;  Location: [facility];  Service: Neurosurgery;  Laterality: Right;  . Foot surgery Right   . Fracture surgery Right     ankle  . Skin graft Left     ring finger injury  . Inguinal hernia  repair Bilateral 03/17/2014    Procedure: LAPAROSCOPIC BILATERAL INGUINAL HERNIA REPAIR WITH MESH;  Surgeon: Axel FillerArmando Ramirez, MD;   Location: [facility] OR;  Service: General;  Laterality: Bilateral;  . Insertion of mesh N/A 03/17/2014    Procedure: INSERTION OF MESH;  Surgeon: Axel FillerArmando Ramirez, MD;  Location: [facility] OR;  Service: General;  Laterality: N/A;   Social History:  reports that he has never smoked. He has never used smokeless tobacco. He reports that he uses illicit drugs ("Crack" cocaine). He reports that he does not drink alcohol.   Family History  Problem Relation Age of Onset  . Hypertension Mother   . Heart attack Mother   . Hypertension Father   . Stomach cancer Father   . Cancer Brother     leukemia  . Renal Disease Brother      Allergies  Allergen Reactions  . Penicillins     unknown      Prior to Admission medications   Medication Sig Start Date End Date Taking? Authorizing Provider  acetaminophen (TYLENOL) 500 MG tablet Take 500 mg by mouth every 6 (six) hours as needed for headache (pain).    Yes Historical Provider, MD  amLODipine (NORVASC) 10 MG tablet Take 10 mg by mouth daily. 07/15/12  Yes Lorre NickAnthony Allen, MD  atenolol (TENORMIN) 25 MG tablet Take 25 mg by mouth 2 (two) times daily.    Yes Historical Provider, MD  carbamazepine (TEGRETOL) 200 MG tablet Take 200 mg by mouth 2 (two) times daily.   Yes Historical Provider, MD  cephALEXin (KEFLEX) 500 MG capsule Take 1 capsule (500 mg total) by mouth 2 (two) times daily. 01/09/15  Yes Ankit Rhunette CroftNanavati, MD  fluticasone (FLONASE) 50 MCG/ACT nasal spray Place 1 spray into both nostrils daily as needed for allergies or rhinitis.   Yes Historical Provider, MD  hydrALAZINE (APRESOLINE) 25 MG tablet Take 25 mg by mouth 4 (four) times daily.  03/29/13  Yes Benjiman CoreNathan Pickering, MD  lisinopril (PRINIVIL,ZESTRIL) 40 MG tablet Take 80 mg by mouth daily.    Yes Historical Provider, MD  Multiple Vitamins-Minerals (MULTIVITAMIN) tablet Take 0.5 tablets by mouth 2 (two) times daily.  07/03/12  Yes Evlyn KannerPamela S Love, PA-C  Propylene Glycol (SYSTANE BALANCE OP) Apply 2 drops to  eye every morning. Patient advised to use two drops in each eye, each morning. Patient states he does not use them every morning and his last dose was about a week ago.   Yes Historical Provider, MD  spironolactone (ALDACTONE) 25 MG tablet Take 25 mg by mouth daily.   Yes Historical Provider, MD  Cetirizine HCl 10 MG CAPS Take 1 capsule (10 mg total) by mouth daily. Patient not taking: Reported on 09/13/2014 05/19/14   Renne CriglerJoshua Geiple, PA-C  fluticasone [facility](FLONASE) 50 MCG/ACT nasal spray Place 2 sprays into both nostrils daily. 12/17/13 05/19/14  Sena HitchStephen Lozier, MD  oxyCODONE-acetaminophen (PERCOCET) 7.5-325 MG per tablet Take 1 tablet by mouth every 4 (four) hours as needed for severe pain. Patient not taking: Reported on 01/09/2015 09/13/14   Nelva Nayobert Beaton, MD    Review of Systems:  Constitutional:  No weight loss, night sweats, Fevers, chills, fatigue.  Head&Eyes: No headache.  No vision loss.  No eye pain or scotoma ENT:  No Difficulty swallowing,Tooth/dental problems,Sore throat,  No ear ache, post nasal drip,  Cardio-vascular:  No chest pain, Orthopnea, PND, swelling in lower extremities,  dizziness, palpitations  GI:  No  abdominal pain, nausea, vomiting, diarrhea, loss of appetite, hematochezia, melena, heartburn,  indigestion, Resp:  No shortness of breath with exertion or at rest. No cough. No coughing up of blood .No wheezing.No chest wall deformity  Skin:  no rash or lesions.  GU:  no dysuria, change in color of urine, no urgency or frequency. No flank pain.  Musculoskeletal:  No joint pain or swelling. No decreased range of motion. No back pain.  Psych:  No change in mood or affect. No depression or anxiety. Neurologic: No headache, no dysesthesia, no focal weakness, no vision loss. No syncope  Physical Exam: Filed Vitals:   01/13/15 1432 01/13/15 1445 01/13/15 1515 01/13/15 1545  BP: 155/80 158/87 153/84 169/87  Pulse: 56 51 50 52  Temp:      TempSrc:      Resp: 18       SpO2: 98% 99% 98% 100%   General:  A&O x 3, NAD, nontoxic, pleasant/cooperative Head/Eye: No conjunctival hemorrhage, no icterus, Gravity/AT, No nystagmus ENT:  No icterus,  No thrush, good dentition, no pharyngeal exudate Neck:  No masses, no lymphadenpathy, no bruits CV:  RRR, no rub, no gallop, no S3 Lung:  CTAB, good air movement, no wheeze, no rhonchi Abdomen: soft/NT, +BS, nondistended, no peritoneal signs Ext: No cyanosis, No rashes, No petechiae, No lymphangitis, No edema Neuro: CNII-XII intact, strength 4/5 in bilateral upper and lower extremities, no dysmetria  Labs on Admission:  Basic Metabolic Panel:  Recent Labs Lab 01/09/15 0120 01/13/15 1200  NA 127* 130*  K 3.7 4.1  CL 91* 96*  CO2 26 28  GLUCOSE 105* 100*  BUN 17 10  CREATININE 1.02 0.84  CALCIUM 9.0 8.8*   Liver Function Tests:  Recent Labs Lab 01/09/15 0120 01/13/15 1200  AST 24 15  ALT 19 17  ALKPHOS 77 61  BILITOT 0.9 0.3  PROT 6.2* 6.4*  ALBUMIN 3.7 3.3*   No results for input(s): LIPASE, AMYLASE in the last 168 hours. No results for input(s): AMMONIA in the last 168 hours. CBC:  Recent Labs Lab 01/09/15 0120 01/13/15 1200  WBC 6.7 5.6  NEUTROABS 6.4  --   HGB 13.8 12.1*  HCT 39.0 35.2*  MCV 83.9 85.6  PLT 159 180   Cardiac Enzymes: No results for input(s): CKTOTAL, CKMB, CKMBINDEX, TROPONINI in the last 168 hours. BNP: Invalid input(s): POCBNP CBG: No results for input(s): GLUCAP in the last 168 hours.  Radiological Exams on Admission: Dg Chest 2 View  01/13/2015  CLINICAL DATA:  Positive blood cultures. EXAM: CHEST  2 VIEW COMPARISON:  01/09/2015 FINDINGS: The heart size and mediastinal contours are within normal limits. Both lungs are clear. The visualized skeletal structures are unremarkable. IMPRESSION: No active cardiopulmonary disease. Electronically Signed   By: Charlett Nose M.D.   On: 01/13/2015 12:24    EKG: Independently reviewed. Sinus rhythm, nonspecific T-wave  changes    Time spent:60 minutes Code Status:   FULL Family Communication:   Wife updated at bedside   Dodi Leu, DO  [facility] Pager [phone]  If 7PM-7AM, please contact night-coverage [email or web address] Password [email or web address] 01/13/2015, 4:22 PM

## 2015-01-13 NOTE — ED Provider Notes (Signed)
CSN: [id number]     Arrival date & time 01/13/15  1127 History   First MD Initiated Contact with Patient 01/13/15 1334     Chief Complaint  Patient presents with  . Blood Infection     (Consider location/radiation/quality/duration/timing/severity/associated sxs/prior Treatment) HPI   Patient was seen Sunday for chills s/p colonoscopy last week.  A sepsis work up was initiated, and ultimately patient was stable for discharge and sent home with UTI diagnosis and treated with Keflex.  A urine culture was performed as well.  Urine culture shows resistance to keflex, and patient was told to come to the ED.  Patient has no complaints at this time.  Denies fevers, chills, HA, neck stiffness, cough, SOB, CP, N/V/D, abdominal pain, dysuria, hematuria, increased urinary frequency, or bloody stools.  Past Medical History  Diagnosis Date  . Hypertension   . Ulcer   . Brain aneurysm   . Epistaxis, recurrent   . Stroke (HCC)     tingling in right thumb (when he had brain aneurysm)  . Acid indigestion   . Seizures (HCC)     had 2 seizures after brain surgery - Takes Tegretol   Past Surgical History  Procedure Laterality Date  . Craniotomy Right 06/14/2012    Procedure: CRANIOTOMY HEMATOMA EVACUATION SUBDURAL;  Surgeon: [name], MD;  Location: [facility];  Service: Neurosurgery;  Laterality: Right;  . Foot surgery Right   . Fracture surgery Right     ankle  . Skin graft Left     ring finger injury  . Inguinal hernia repair Bilateral 03/17/2014    Procedure: LAPAROSCOPIC BILATERAL INGUINAL HERNIA REPAIR WITH MESH;  Surgeon: Axel Filler, MD;  Location: [facility] OR;  Service: General;  Laterality: Bilateral;  . Insertion of mesh N/A 03/17/2014    Procedure: INSERTION OF MESH;  Surgeon: Axel Filler, MD;  Location: [facility] OR;  Service: General;  Laterality: N/A;   Family History  Problem Relation Age of Onset  . Hypertension Mother   . Heart attack Mother   . Hypertension Father   .  Stomach cancer Father   . Cancer Brother     leukemia  . Renal Disease Brother    Social History  Substance Use Topics  . Smoking status: Never Smoker   . Smokeless tobacco: Never Used  . Alcohol Use: No     Comment: formerly drank occ beer. None since brain surgery    Review of Systems All other systems negative unless otherwise stated in HPI  Allergies  Penicillins  Home Medications   Prior to Admission medications   Medication Sig Start Date End Date Taking? Authorizing Provider  acetaminophen (TYLENOL) 500 MG tablet Take 500 mg by mouth every 6 (six) hours as needed for headache (pain).    Yes Historical Provider, MD  amLODipine (NORVASC) 10 MG tablet Take 10 mg by mouth daily. 07/15/12  Yes Lorre Nick, MD  atenolol (TENORMIN) 25 MG tablet Take 25 mg by mouth 2 (two) times daily.    Yes Historical Provider, MD  carbamazepine (TEGRETOL) 200 MG tablet Take 200 mg by mouth 2 (two) times daily.   Yes Historical Provider, MD  cephALEXin (KEFLEX) 500 MG capsule Take 1 capsule (500 mg total) by mouth 2 (two) times daily. 01/09/15  Yes Ankit Rhunette Croft, MD  fluticasone (FLONASE) 50 MCG/ACT nasal spray Place 1 spray into both nostrils daily as needed for allergies or rhinitis.   Yes Historical Provider, MD  hydrALAZINE (APRESOLINE) 25 MG tablet Take  25 mg by mouth 4 (four) times daily.  03/29/13  Yes Benjiman CoreNathan Pickering, MD  lisinopril (PRINIVIL,ZESTRIL) 40 MG tablet Take 80 mg by mouth daily.    Yes Historical Provider, MD  Multiple Vitamins-Minerals (MULTIVITAMIN) tablet Take 0.5 tablets by mouth 2 (two) times daily.  07/03/12  Yes Evlyn KannerPamela S Love, PA-C  Propylene Glycol (SYSTANE BALANCE OP) Apply 2 drops to eye every morning. Patient advised to use two drops in each eye, each morning. Patient states he does not use them every morning and his last dose was about a week ago.   Yes Historical Provider, MD  spironolactone (ALDACTONE) 25 MG tablet Take 25 mg by mouth daily.   Yes Historical  Provider, MD  Cetirizine HCl 10 MG CAPS Take 1 capsule (10 mg total) by mouth daily. Patient not taking: Reported on 09/13/2014 05/19/14   Renne CriglerJoshua Geiple, PA-C  fluticasone [facility](FLONASE) 50 MCG/ACT nasal spray Place 2 sprays into both nostrils daily. 12/17/13 05/19/14  Sena HitchStephen Lozier, MD  oxyCODONE-acetaminophen (PERCOCET) 7.5-325 MG per tablet Take 1 tablet by mouth every 4 (four) hours as needed for severe pain. Patient not taking: Reported on 01/09/2015 09/13/14   Nelva Nayobert Beaton, MD   BP 155/80 mmHg  Pulse 56  Temp(Src) 98.8 F (37.1 C) (Oral)  Resp 18  SpO2 98% Physical Exam  Constitutional: He is oriented to person, place, and time. He appears well-developed and well-nourished.  HENT:  Head: Atraumatic.  Eyes: Conjunctivae are normal. No scleral icterus.  Neck: No tracheal deviation present.  Cardiovascular: Normal rate, regular rhythm and normal heart sounds.   Pulmonary/Chest: Effort normal and breath sounds normal. No respiratory distress. He has no wheezes. He has no rales.  Abdominal: Soft. Bowel sounds are normal. He exhibits no distension. There is no tenderness. There is no rebound.  Neurological: He is alert and oriented to person, place, and time.  Skin: Skin is warm and dry.  Psychiatric: He has a normal mood and affect. His behavior is normal.    ED Course  Procedures (including critical care time) Labs Review Labs Reviewed  COMPREHENSIVE METABOLIC PANEL - Abnormal; Notable for the following:    Sodium 130 (*)    Chloride 96 (*)    Glucose, Bld 100 (*)    Calcium 8.8 (*)    Total Protein 6.4 (*)    Albumin 3.3 (*)    All other components within normal limits  CBC - Abnormal; Notable for the following:    RBC 4.11 (*)    Hemoglobin 12.1 (*)    HCT 35.2 (*)    All other components within normal limits  I-STAT CG4 LACTIC ACID, ED - Abnormal; Notable for the following:    Lactic Acid, Venous 0.49 (*)    All other components within normal limits  CULTURE, BLOOD  (ROUTINE X 2)  CULTURE, BLOOD (ROUTINE X 2)  URINE CULTURE  URINALYSIS, ROUTINE W REFLEX MICROSCOPIC (NOT [facility])  I-STAT CG4 LACTIC ACID, ED    Imaging Review Dg Chest 2 View  01/13/2015  CLINICAL DATA:  Positive blood cultures. EXAM: CHEST  2 VIEW COMPARISON:  01/09/2015 FINDINGS: The heart size and mediastinal contours are within normal limits. Both lungs are clear. The visualized skeletal structures are unremarkable. IMPRESSION: No active cardiopulmonary disease. Electronically Signed   By: Charlett NoseKevin  Dover M.D.   On: 01/13/2015 12:24   I have personally reviewed and evaluated these images and lab results as part of my medical decision-making.   EKG Interpretation None  MDM   Final diagnoses:  Bacteremia    Patient presents with gram positive blood cultures and urine cultures from previous visit on Sunday when he was seen for chills after his colonoscopy.  Patient has been compliant with keflex.  No complaints at this time. Physical exam benign.  Will start vancomycin and fluids.  Will admit to medicine for bacteremia.     Cheri Fowler, PA-C 01/13/15 1556  Lyndal Pulley, MD 01/15/15 [phone]

## 2015-01-13 NOTE — ED Notes (Signed)
Pt was called by flow nurse and told to come to er for + blood cultures. He was here Sunday for chills after a colonoscopy. He denies complaints today, states he has been taking oral antibiotics as instructed

## 2015-01-14 DIAGNOSIS — N390 Urinary tract infection, site not specified: Secondary | ICD-10-CM | POA: Diagnosis not present

## 2015-01-14 LAB — CULTURE, BLOOD (ROUTINE X 2): CULTURE: NO GROWTH

## 2015-01-14 LAB — TSH: TSH: 0.873 u[IU]/mL (ref 0.350–4.500)

## 2015-01-14 MED ORDER — SULFAMETHOXAZOLE-TRIMETHOPRIM 800-160 MG PO TABS: 1.0000 | ORAL_TABLET | Freq: Two times a day (BID) | ORAL | Status: DC

## 2015-01-14 MED ORDER — HYDRALAZINE HCL 50 MG PO TABS: 50.0000 mg | ORAL_TABLET | Freq: Three times a day (TID) | ORAL | Status: AC

## 2015-01-14 MED ORDER — SULFAMETHOXAZOLE-TRIMETHOPRIM 800-160 MG PO TABS
1.0000 | ORAL_TABLET | Freq: Two times a day (BID) | ORAL | Status: DC
  Administered 2015-01-14: 1 via ORAL
  Filled 2015-01-14: qty 1

## 2015-01-14 MED ORDER — HYDRALAZINE HCL 50 MG PO TABS
50.0000 mg | ORAL_TABLET | Freq: Three times a day (TID) | ORAL | Status: DC
  Administered 2015-01-14: 50 mg via ORAL
  Filled 2015-01-14: qty 1

## 2015-01-14 MED ORDER — LISINOPRIL 40 MG PO TABS: 40.0000 mg | ORAL_TABLET | Freq: Every day | ORAL | Status: AC

## 2015-01-14 NOTE — Progress Notes (Signed)
Discharge instructions gone over with patient and finance. Home medications gone over.  Patient has appointment scheduled for follow up already. Prescriptions electronically sent. Diet, activity, and reasons to call the doctor discussed. Patient verbalized understanding of instructions.

## 2015-01-14 NOTE — Discharge Summary (Signed)
Physician Discharge Summary  Luis Oconnor YQM:[id number] DOB: 03-12-1956 DOA: 01/13/2015  PCP: Willey Blade, MD  Admit date: 01/13/2015 Discharge date: 01/14/2015  Recommendations for Outpatient Follow-up:  1. Pt will need to follow up with PCP in 2 weeks post discharge 2. Please obtain BMP in one week  Discharge Diagnoses:  Bacteremia-CoNS -01/09/15 blood culture prelim was GPC in clusters -Suspect that this is a contaminant as it took the blood culture 3 days to become positive -Nevertheless, the patient was admitted for intravenous vancomycin pending final culture susceptibility data -The susceptibility of the blood culture returned on 01/14/2015 showing coagulase-negative Staphylococcus in one of 2 sets -This likely represents a contaminant. IV Antibiotics were discontinued. UTI -This likely is attributing to the patient's fevers and chills. He had significant pyuria with urine culture that grew Enterobacter aerogenes on 01/09/15 with significant pyuria -Started the patient on ceftriaxone; based upon the patient's culture data during the hospitalization -d/c home with Bactrim DS x 7 days Fevers and chills -The patient remained afebrile and hemodynamically stable without leukocytosis. -Lactic acid is unremarkable -Chest x-ray is negative for acute findings Hypertension -Continue amlodipine, atenolol, hydralazine, Aldactone -Adjust the patient's lisinopril dose to 40 mg daily; 80 mg dose will not add any additional therapeutic benefit -increase hydralazine to  tid Hyponatremia -This is multifactorial, but most likely due to the patient's medications including lisinopril, Aldactone, and carbamazepine all of which can contribute to his hyponatremia  -Gave the patient a fluid challenge of 1 L--he did appear hypovolemic clinically -certainly, if the patient's hyponatremia continues to worsen further workup will need to be taken  -recheck BMP  History of hemorrhagic stroke in  2014 /Seizure disorder -Continue carbamazepine -Status post aneurysm clipping   Discharge Condition: stable  Disposition: home  Diet:heart healthy Wt Readings from Last 3 Encounters:  01/14/15 60.147 kg (132 lb 9.6 oz)  01/09/15 60.147 kg (132 lb 9.6 oz)  12/22/14 63.504 kg (140 lb)    History of present illness:  59 year old male with a history of hypertension, hemorrhagic stroke status post aneurysm clipping, GERD, seizure disorder presented to the emergency department initially on 01/09/2015 with 3-4 day history of fevers and chills. The patient had a colonoscopy performed by Dr. Evette Cristal on 01/05/2015. The colonoscopy was performed due to intermittent hematochezia. Per the patient, the colonoscopy was unremarkable except for a polyp. 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 During his emergency department visit on 01/09/2015, blood cultures were obtained. The patient was discharged home with cephalexin. Urinalysis was obtained at that time and revealed pyuria with 21-50 WBCs. The patient's blood cultures obtained on 01/09/2015 became positive growing gram-positive cocci in clusters. As result, the patient was contacted to come back to the hospital for admission for intravenous antibiotics. Notably, the patient states that his fevers and chills overall have improved. He denies any headaches, neck pain, chest discomfort, shortness of breath, nausea, vomiting, diarrhea, vomiting, dysuria, hematuria, hematochezia, melena. There is no unusual rashes or synovitis.  Discharge Exam: Filed Vitals:   01/14/15 1405  BP: 171/100  Pulse: 67  Temp: 98.6 F (37 C)  Resp: 18   Filed Vitals:   01/13/15 2203 01/14/15 0527 01/14/15 1100 01/14/15 1405  BP: 175/86 148/94  171/100  Pulse: 54 49  67  Temp: 98.3 F (36.8 C) 98.4 F (36.9 C)  98.6 F (37 C)  TempSrc: Oral Oral  Oral  Resp: Height:    (1.676 m)  Weight:   60.147 kg (132 lb 9.6 oz)   SpO2: 100% 99%   99%   General: A&O x 3, NAD, pleasant, cooperative Cardiovascular: RRR, no rub, no gallop, no S3 Respiratory: CTAB, no wheeze, no rhonchi Abdomen:soft, nontender, nondistended, positive bowel sounds Extremities: No edema, No lymphangitis, no petechiae  Discharge Instructions      Discharge Instructions    Diet - low sodium heart healthy    Complete by:  As directed      Increase activity slowly    Complete by:  As directed             Medication List    STOP taking these medications        cephALEXin 500 MG capsule  Commonly known as:  KEFLEX     Cetirizine HCl 10 MG Caps     oxyCODONE-acetaminophen 7.5-325 MG tablet  Commonly known as:  PERCOCET      TAKE these medications        acetaminophen 500 MG tablet  Commonly known as:  TYLENOL  Take 500 mg by mouth every 6 (six) hours as needed for headache (pain).     amLODipine 10 MG tablet  Commonly known as:  NORVASC  Take 10 mg by mouth daily.     atenolol 25 MG tablet  Commonly known as:  TENORMIN  Take 25 mg by mouth 2 (two) times daily.     carbamazepine 200 MG tablet  Commonly known as:  TEGRETOL  Take 200 mg by mouth 2 (two) times daily.     fluticasone 50 MCG/ACT nasal spray  Commonly known as:  FLONASE  Place 1 spray into both nostrils daily as needed for allergies or rhinitis.     hydrALAZINE 50 MG tablet  Commonly known as:  APRESOLINE  Take 1 tablet (50 mg total) by mouth every 8 (eight) hours.     lisinopril 40 MG tablet  Commonly known as:  PRINIVIL,ZESTRIL  Take 1 tablet (40 mg total) by mouth daily.     multivitamin tablet  Take 0.5 tablets by mouth 2 (two) times daily.     spironolactone 25 MG tablet  Commonly known as:  ALDACTONE  Take 25 mg by mouth daily.     sulfamethoxazole-trimethoprim 800-160 MG tablet  Commonly known as:  BACTRIM DS,SEPTRA DS  Take 1 tablet by mouth every 12 (twelve) hours.     SYSTANE BALANCE OP  Apply 2 drops to eye every morning. Patient advised to  use two drops in each eye, each morning. Patient states he does not use them every morning and his last dose was about a week ago.         The results of significant diagnostics from this hospitalization (including imaging, microbiology, ancillary and laboratory) are listed below for reference.    Significant Diagnostic Studies: Dg Chest 2 View  01/13/2015  CLINICAL DATA:  Positive blood cultures. EXAM: CHEST  2 VIEW COMPARISON:  01/09/2015 FINDINGS: The heart size and mediastinal contours are within normal limits. Both lungs are clear. The visualized skeletal structures are unremarkable. IMPRESSION: No active cardiopulmonary disease. Electronically Signed   By: Charlett NoseKevin  Dover M.D.   On: 01/13/2015 12:24   Dg Chest 2 View  01/09/2015  CLINICAL DATA:  Initial evaluation for acute sepsis.  Fever. EXAM: CHEST  2 VIEW COMPARISON:  Prior radiograph from 03/29/2013. FINDINGS: Extends to a shin of the cardiac silhouette related to AP technique and shallow lung inflation. Mild cardiomegaly is likely stable.  Mediastinal silhouette within normal limits. Lungs are hypoinflated. Linear opacity along the left hemidiaphragm most consistent with atelectasis. No definite focal infiltrates. No pneumothorax. No pulmonary edema or pleural effusion. No acute osseus abnormality. IMPRESSION: Shallow lung inflation with patchy and linear left basilar opacity, most consistent with atelectasis. No other active cardiopulmonary disease. Electronically Signed   By: Rise Mu M.D.   On: 01/09/2015 02:12     Microbiology: Recent Results (from the past 240 hour(s))  Blood Culture (routine x 2)     Status: None   Collection Time: 01/09/15  1:20 AM  Result Value Ref Range Status   Specimen Description BLOOD RIGHT ANTECUBITAL  Final   Special Requests BOTTLES DRAWN AEROBIC AND ANAEROBIC  Final   Culture NO GROWTH 5 DAYS  Final   Report Status 01/14/2015 FINAL  Final  Blood Culture (routine x 2)     Status:  None   Collection Time: 01/09/15  1:24 AM  Result Value Ref Range Status   Specimen Description BLOOD LEFT ARM  Final   Special Requests BOTTLES DRAWN AEROBIC AND ANAEROBIC 5CC  Final   Culture  Setup Time   Final    GRAM POSITIVE COCCI IN CLUSTERS AEROBIC BOTTLE ONLY CRITICAL RESULT CALLED TO, READ BACK BY AND VERIFIED WITH: K ROBERTSON RN 2002 01/12/15 A BROWNING    Culture   Final    STAPHYLOCOCCUS SPECIES (COAGULASE NEGATIVE) THE SIGNIFICANCE OF ISOLATING THIS ORGANISM FROM A SINGLE SET OF BLOOD CULTURES WHEN MULTIPLE SETS ARE DRAWN IS UNCERTAIN. PLEASE NOTIFY THE MICROBIOLOGY DEPARTMENT WITHIN ONE WEEK IF SPECIATION AND SENSITIVITIES ARE REQUIRED.    Report Status 01/14/2015 FINAL  Final  Urine culture     Status: None   Collection Time: 01/09/15  1:42 AM  Result Value Ref Range Status   Specimen Description URINE, RANDOM  Final   Special Requests NONE  Final   Culture 60,000 COLONIES/ml ENTEROBACTER AEROGENES  Final   Report Status 01/11/2015 FINAL  Final   Organism ID, Bacteria ENTEROBACTER AEROGENES  Final      Susceptibility   Enterobacter aerogenes - MIC*    CEFAZOLIN >=64 RESISTANT Resistant     CEFTRIAXONE <=1 SENSITIVE Sensitive     CIPROFLOXACIN <=0.25 SENSITIVE Sensitive     GENTAMICIN 4 SENSITIVE Sensitive     IMIPENEM <=0.25 SENSITIVE Sensitive     NITROFURANTOIN 32 SENSITIVE Sensitive     TRIMETH/SULFA <=20 SENSITIVE Sensitive     PIP/TAZO <=4 SENSITIVE Sensitive     * 60,000 COLONIES/ml ENTEROBACTER AEROGENES  Culture, blood (routine x 2)     Status: None (Preliminary result)   Collection Time: 01/13/15 11:53 AM  Result Value Ref Range Status   Specimen Description BLOOD LEFT ARM  Final   Special Requests   Final    BOTTLES DRAWN AEROBIC AND ANAEROBIC 10CC BLUE 5CC RED   Culture NO GROWTH 1 DAY  Final   Report Status PENDING  Incomplete  Culture, blood (routine x 2)     Status: None (Preliminary result)   Collection Time: 01/13/15 11:58 AM  Result Value  Ref Range Status   Specimen Description BLOOD RIGHT ARM  Final   Special Requests BOTTLES DRAWN AEROBIC ONLY 10CC  Final   Culture NO GROWTH 1 DAY  Final   Report Status PENDING  Incomplete  Urine culture     Status: None (Preliminary result)   Collection Time: 01/13/15  1:50 PM  Result Value Ref Range Status   Specimen Description URINE, CLEAN  CATCH  Final   Special Requests NONE  Final   Culture NO GROWTH < 24 HOURS  Final   Report Status PENDING  Incomplete     Labs: Basic Metabolic Panel:  Recent Labs Lab 01/09/15 0120 01/13/15 1200  NA 127* 130*  K 3.7 4.1  CL 91* 96*  CO2 26 28  GLUCOSE 105* 100*  BUN 17 10  CREATININE 1.02 0.84  CALCIUM 9.0 8.8*   Liver Function Tests:  Recent Labs Lab 01/09/15 0120 01/13/15 1200  AST 24 15  ALT 19 17  ALKPHOS 77 61  BILITOT 0.9 0.3  PROT 6.2* 6.4*  ALBUMIN 3.7 3.3*   No results for input(s): LIPASE, AMYLASE in the last 168 hours. No results for input(s): AMMONIA in the last 168 hours. CBC:  Recent Labs Lab 01/09/15 0120 01/13/15 1200  WBC 6.7 5.6  NEUTROABS 6.4  --   HGB 13.8 12.1*  HCT 39.0 35.2*  MCV 83.9 85.6  PLT 159 180   Cardiac Enzymes: No results for input(s): CKTOTAL, CKMB, CKMBINDEX, TROPONINI in the last 168 hours. BNP: Invalid input(s): POCBNP CBG: No results for input(s): GLUCAP in the last 168 hours.  Time coordinating discharge:  Greater than 30 minutes  Signed:  Rozanna Cormany, DO [facility] Pager: [phone] 01/14/2015, 3:01 PM

## 2015-01-15 LAB — URINE CULTURE: CULTURE: NO GROWTH

## 2015-01-18 LAB — CULTURE, BLOOD (ROUTINE X 2)
CULTURE: NO GROWTH
Culture: NO GROWTH

## 2016-05-24 IMAGING — DX DG CHEST 2V
2 series · 2 of 2 positions shown · non-contrast
Comparison: 01/09/2015

CLINICAL DATA: Positive blood cultures.

EXAM:
CHEST  2 VIEW

[chest pa]
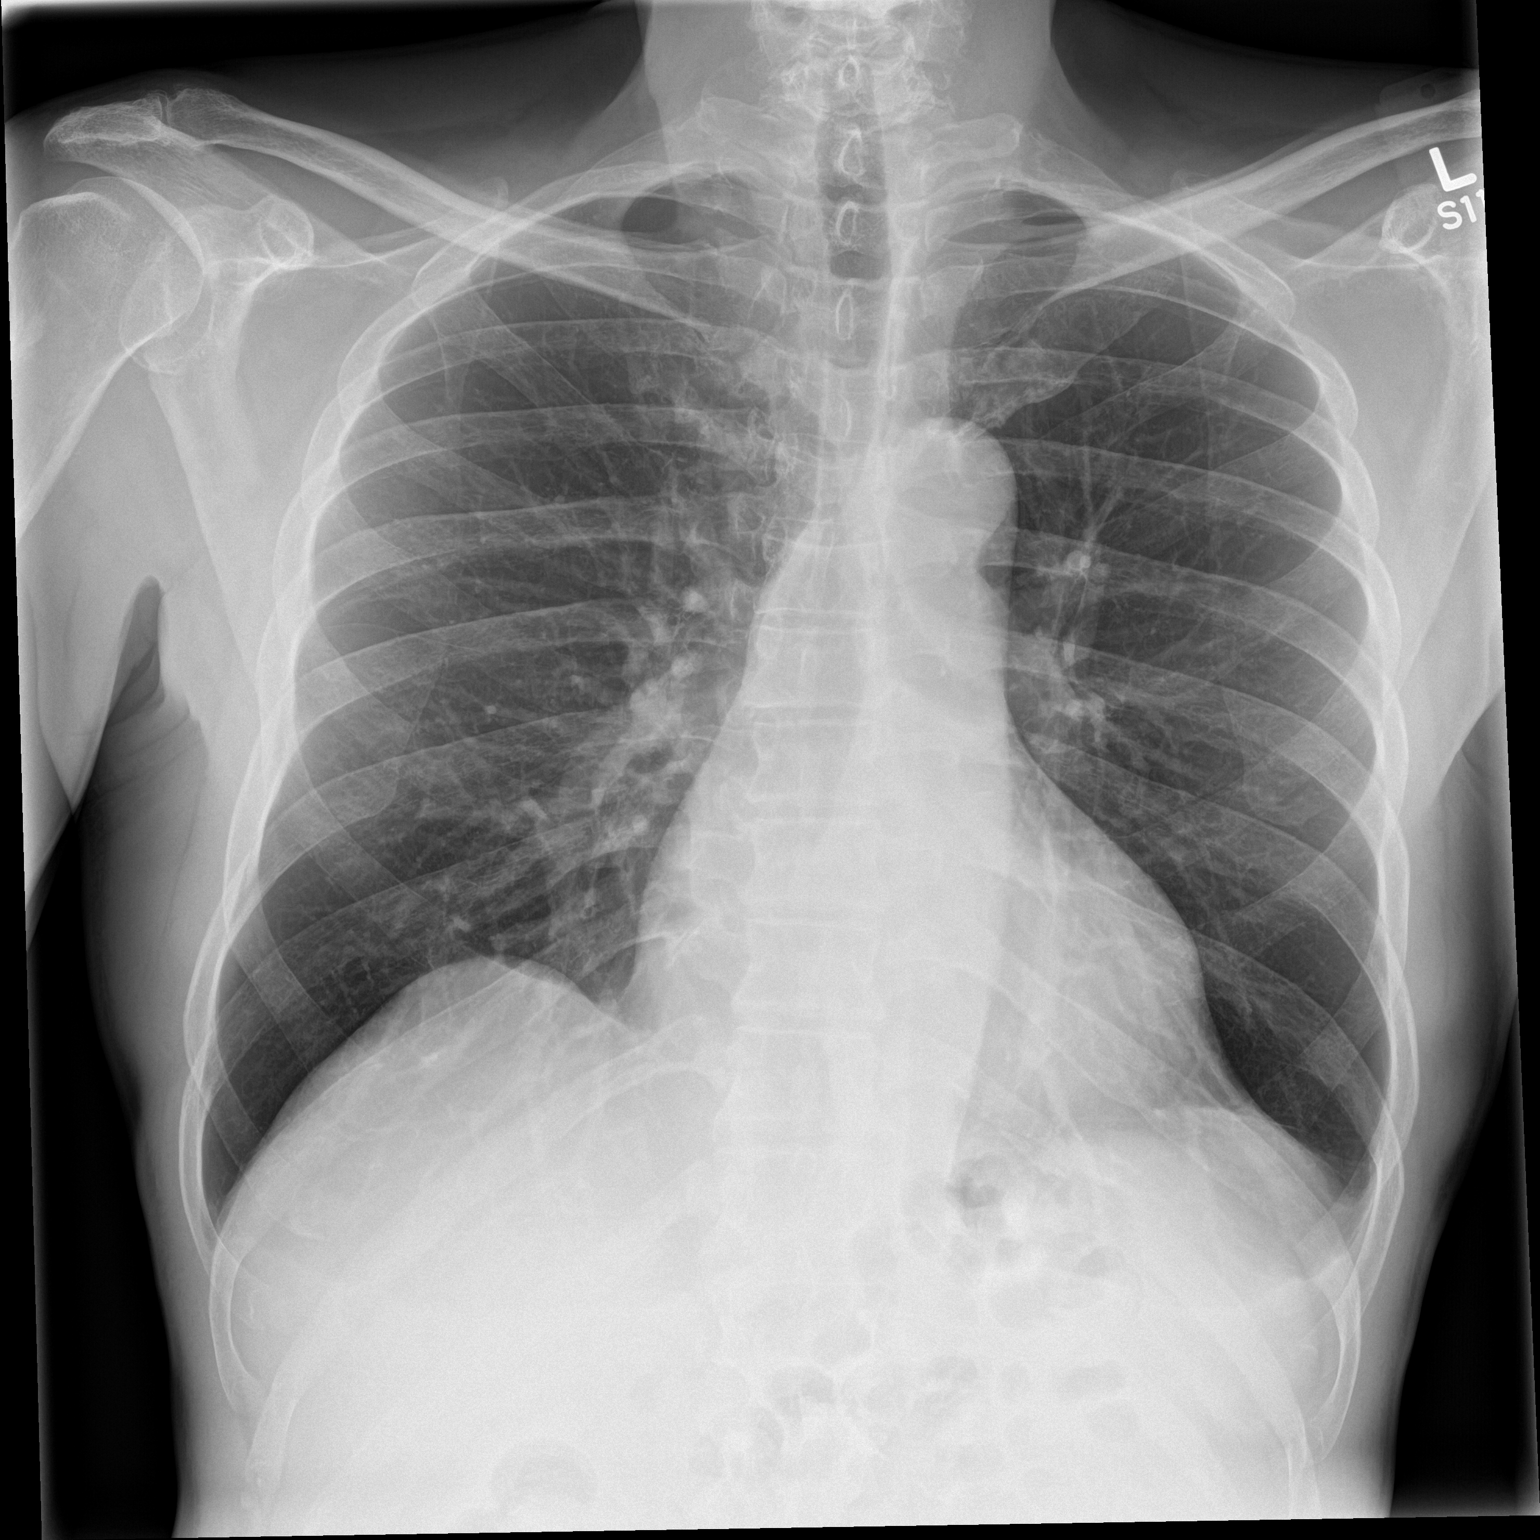

[chest lat]
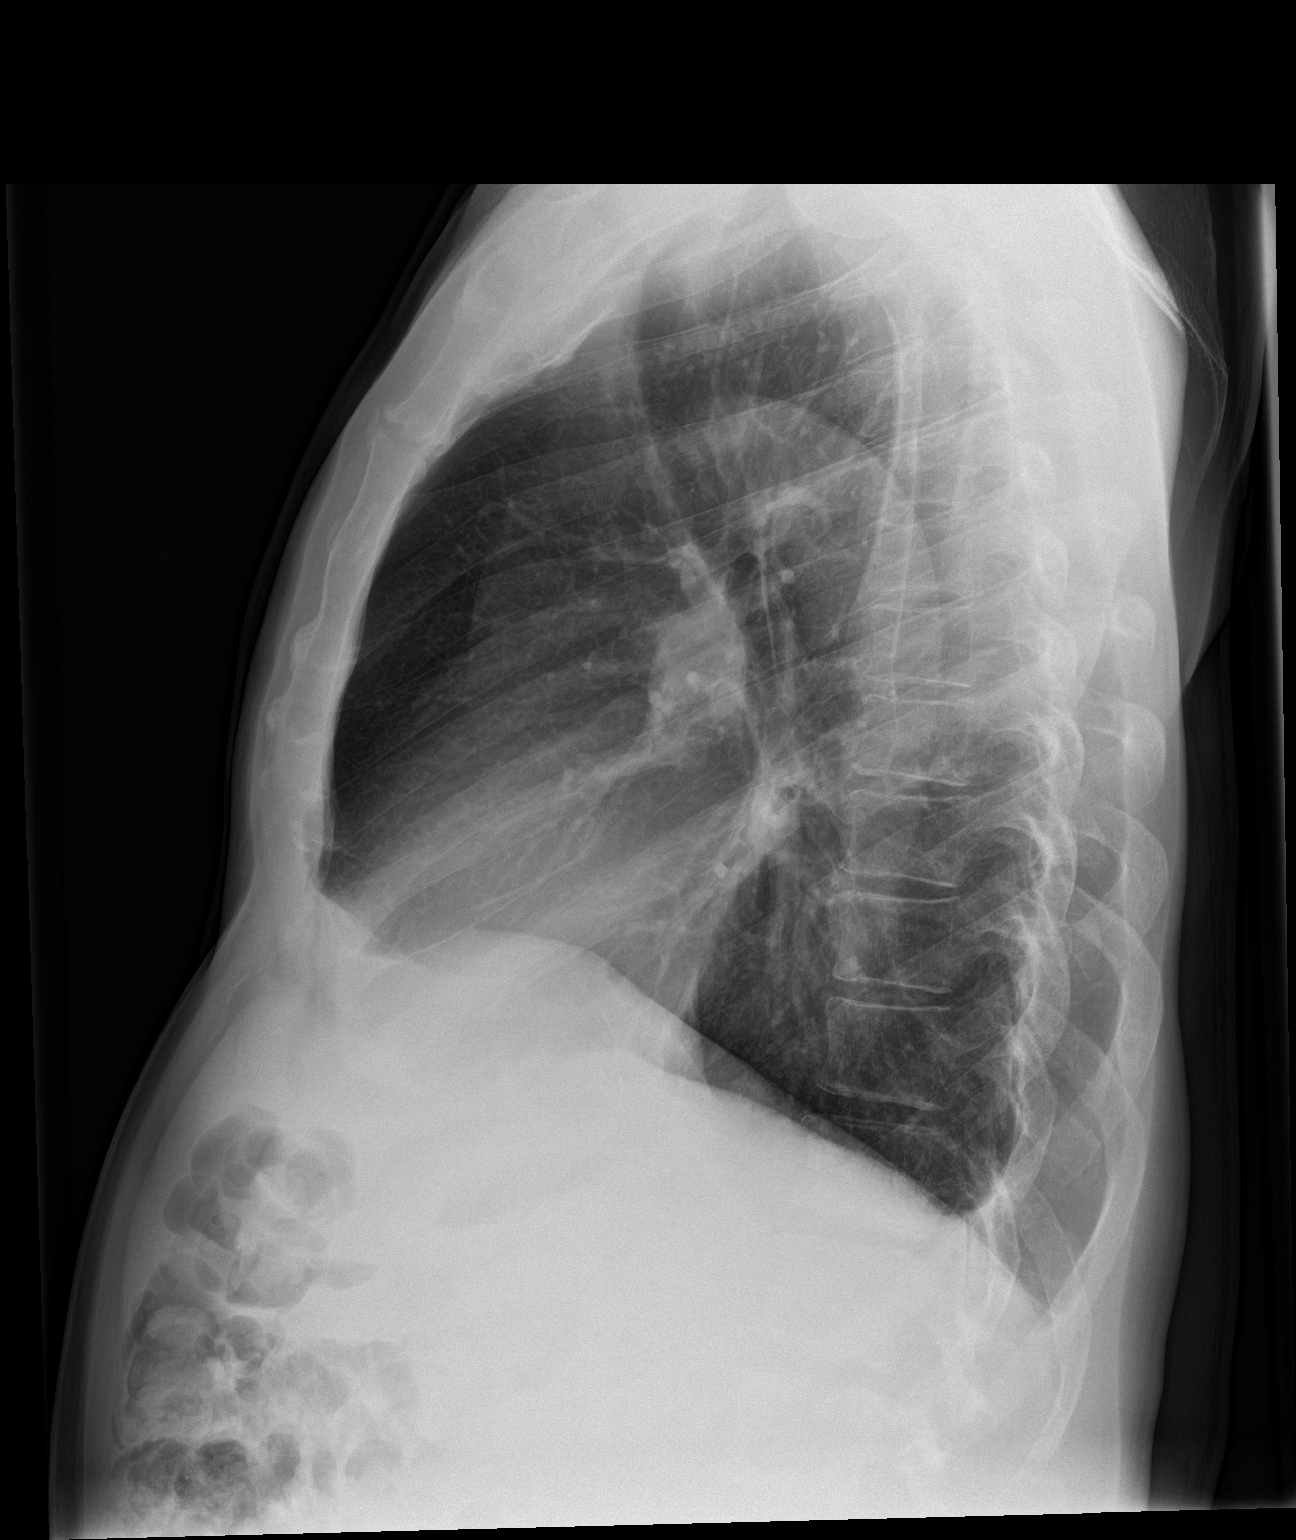

[2 of 2 positions shown; findings below may reference images not displayed]

FINDINGS: The heart size and mediastinal contours are within normal limits.
Both lungs are clear. The visualized skeletal structures are
unremarkable.
IMPRESSION: No active cardiopulmonary disease.

## 2017-02-04 ENCOUNTER — Emergency Department (HOSPITAL_COMMUNITY): Payer: Medicare Other

## 2017-02-04 ENCOUNTER — Emergency Department (HOSPITAL_COMMUNITY)
Admission: EM | Admit: 2017-02-04 | Discharge: 2017-02-04 | Disposition: A | Discharge status: Home / Self Care | Payer: Medicare Other | Attending: Emergency Medicine | Admitting: Emergency Medicine

## 2017-02-04 ENCOUNTER — Other Ambulatory Visit: Payer: Self-pay

## 2017-02-04 ENCOUNTER — Encounter (HOSPITAL_COMMUNITY): Payer: Self-pay

## 2017-02-04 DIAGNOSIS — Z8673 Personal history of transient ischemic attack (TIA), and cerebral infarction without residual deficits: Secondary | ICD-10-CM | POA: Insufficient documentation

## 2017-02-04 DIAGNOSIS — K0401 Reversible pulpitis: Secondary | ICD-10-CM | POA: Diagnosis not present

## 2017-02-04 DIAGNOSIS — K029 Dental caries, unspecified: Secondary | ICD-10-CM | POA: Diagnosis not present

## 2017-02-04 DIAGNOSIS — I10 Essential (primary) hypertension: Secondary | ICD-10-CM | POA: Insufficient documentation

## 2017-02-04 DIAGNOSIS — R51 Headache: Secondary | ICD-10-CM | POA: Diagnosis present

## 2017-02-04 DIAGNOSIS — Z79899 Other long term (current) drug therapy: Secondary | ICD-10-CM | POA: Diagnosis not present

## 2017-02-04 MED ORDER — CLINDAMYCIN HCL 150 MG PO CAPS: 150.0000 mg | ORAL_CAPSULE | Freq: Four times a day (QID) | ORAL | 0 refills | Status: DC

## 2017-02-04 NOTE — ED Notes (Signed)
Pt verbalized understanding of discharge instructions. Instructed to complete abx and follow up with dentist. Pt ambulatory with cane.

## 2017-02-04 NOTE — ED Notes (Signed)
Pt reports R side h/a since yesterday, he is also c/o R dental pain which also started yesterday.  He is unsure if the h/a is d/t the dental pain, but he reports having brain surgery on the right x 2 years ago.  Reports calling his doctor and they are unable to see him until December.  Pt is ambulatory with a cane.  Pt reports hx of stroke.

## 2017-02-04 NOTE — ED Notes (Signed)
Patient transported to CT 

## 2017-02-04 NOTE — ED Triage Notes (Signed)
Pt arrived from home c/o right sided headache that started yesterday.  Brain surgery 2 years ago. Seen at PCP yesterday.

## 2017-02-04 NOTE — ED Provider Notes (Signed)
[facility] EMERGENCY DEPARTMENT Provider Note   CSN: [id number] Arrival date & time: 02/04/17  1427     History   Chief Complaint Chief Complaint  Patient presents with  . Headache    HPI Luis Oconnor is a 61 y.o. male.  Patient is a 61 year old male with a history of hypertension, brain aneurysm, stroke, seizures who presents today with right-sided dental pain and mild headache for the last 3 days.  He states he took a Tylenol which improved the pain but the pain returned.  Because of his prior brain aneurysm and surgery he was concerned there may be a complication with that.  He denies any numbness, weakness, vision changes.  No crippling headaches but just a mild discomfort on the scalp portion behind his right ear.  He denies any hearing changes.  No fevers.  No throat swelling or difficulty swallowing.   The history is provided by the patient.    Past Medical History:  Diagnosis Date  . Acid indigestion   . Brain aneurysm   . Cataract   . Epistaxis, recurrent   . GERD (gastroesophageal reflux disease)   . Hypertension   . Seizures (HCC)    had 2 seizures after brain surgery - Takes Tegretol  . Stroke (HCC)    tingling in right thumb (when he had brain aneurysm)  . Ulcer     Patient Active Problem List   Diagnosis Date Noted  . Bacteremia 01/13/2015  . Essential hypertension 01/13/2015  . UTI (lower urinary tract infection) 01/13/2015  . Cephalalgia 12/17/2013  . Hypertension, accelerated 10/24/2012  . Vomiting 09/05/2012  . Cerebral parenchymal hemorrhage (HCC) 06/24/2012  . HTN (hypertension) 06/24/2012  . Hyponatremia 06/24/2012  . Headache(784.0) 06/24/2012  . Fever 06/17/2012  . 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 COLONOSCOPY  12/2014  . FOOT SURGERY Right   . FRACTURE SURGERY Right    ankle  . SKIN GRAFT Left    ring finger injury       Home Medications    Prior  to Admission medications   Medication Sig Start Date End Date Taking? Authorizing Provider  acetaminophen (TYLENOL) 500 MG tablet Take 500 mg by mouth every 6 (six) hours as needed for headache (pain).     [provider]  amLODipine (NORVASC) 10 MG tablet Take 10 mg by mouth daily. 07/15/12   Lorre Nick, MD  atenolol (TENORMIN) 25 MG tablet Take 25 mg by mouth 2 (two) times daily.     [provider]  carbamazepine (TEGRETOL) 200 MG tablet Take 200 mg by mouth 2 (two) times daily.    [provider]  fluticasone (FLONASE) 50 MCG/ACT nasal spray Place 1 spray into both nostrils daily as needed for allergies or rhinitis.    [provider]  hydrALAZINE (APRESOLINE) 50 MG tablet Take 1 tablet (50 mg total) by mouth every 8 (eight) hours. 01/14/15   Catarina Hartshorn, MD  lisinopril (PRINIVIL,ZESTRIL) 40 MG tablet Take 1 tablet (40 mg total) by mouth daily. 01/14/15   Catarina Hartshorn, MD  Multiple Vitamins-Minerals (MULTIVITAMIN) tablet Take 0.5 tablets by mouth 2 (two) times daily.  07/03/12   Love, Evlyn Kanner, PA-C  Propylene Glycol (SYSTANE BALANCE OP) Apply 2 drops to eye every morning. Patient advised to use two drops in each eye, each morning. Patient states he does not use them every morning and his last dose was about a week ago.  [provider]  spironolactone (ALDACTONE) 25 MG tablet Take 25 mg by mouth daily.    [provider]  sulfamethoxazole-trimethoprim (BACTRIM DS,SEPTRA DS) 800-160 MG tablet Take 1 tablet by mouth every 12 (twelve) hours. 01/14/15   Catarina Hartshornat, David, MD    Family History Family History  Problem Relation Age of Onset  . Hypertension Mother   . Heart attack Mother   . Hypertension Father   . Stomach cancer Father   . Cancer Brother        leukemia  . Renal Disease Brother     Social History Social History   Tobacco Use  . Smoking status: Never Smoker  . Smokeless tobacco: Never Used  Substance Use Topics  . Alcohol  use: No    Comment: formerly drank occ beer. None since brain surgery  . Drug use: Yes    Types: "Crack" cocaine    Comment: none for 1-2 years     Allergies   Penicillins   Review of Systems Review of Systems  All other systems reviewed and are negative.    Physical Exam Updated Vital Signs BP (!) 147/77   Pulse (!) 55   Temp 98.9 F (37.2 C) (Oral)   Resp 16   Ht 5\' 6"  (1.676 m)   Wt 63.5 kg (140 lb)   SpO2 99%   BMI 22.60 kg/m   Physical Exam  Constitutional: He is oriented to person, place, and time. He appears well-developed and well-nourished. No distress.  HENT:  Head: Normocephalic and atraumatic.  Right Ear: Tympanic membrane normal.  Left Ear: Tympanic membrane normal.  Mouth/Throat: Oropharynx is clear and moist. No trismus in the jaw. Dental caries present. No uvula swelling.    Eyes: Conjunctivae and EOM are normal. Pupils are equal, round, and reactive to light.  Neck: Normal range of motion. Neck supple.  Cardiovascular: Normal rate, regular rhythm and intact distal pulses.  No murmur heard. Pulmonary/Chest: Effort normal and breath sounds normal. No respiratory distress. He has no wheezes. He has no rales.  Abdominal: There is no rebound.  Musculoskeletal: Normal range of motion. He exhibits no edema or tenderness.  Neurological: He is alert and oriented to person, place, and time. He has normal strength. No cranial nerve deficit or sensory deficit. Coordination normal.  Skin: Skin is warm and dry. No rash noted. No erythema.  Psychiatric: He has a normal mood and affect. His behavior is normal.  Nursing note and vitals reviewed.    ED Treatments / Results  Labs (all labs ordered are listed, but only abnormal results are displayed) Labs Reviewed - No data to display  EKG  EKG Interpretation None       Radiology Ct Head Wo Contrast  Result Date: 02/04/2017 CLINICAL DATA:  Headache and toothache.  History of aneurysm. EXAM: CT HEAD  WITHOUT CONTRAST TECHNIQUE: Contiguous axial images were obtained from the base of the skull through the vertex without intravenous contrast. COMPARISON:  09/13/2014 FINDINGS: Brain: Chronic stable encephalomalacia involving the right temporoparietal lobe consistent with remote MCA infarct with ex vacuo dilatation of the adjacent right lateral ventricle. No hydrocephalus. Chronic stable small vessel ischemic disease of periventricular white matter. Chronic remote lacunar infarct of the right basal ganglia. No new large vascular territory infarction, hemorrhage or midline shift. No intra-axial mass nor extra-axial fluid collections. Vascular: No hyperdense vessels. Skull: Evidence of prior remote right parietal craniotomy. Osteopenia of the skullbase. No acute skull fracture or suspicious osseous lesions. Clear bilateral mastoids.  Sinuses/Orbits: Mild ethmoid sinus mucosal thickening. Intact orbits and globes. Other: None IMPRESSION: 1. Stigmata of chronic right MCA distribution infarct with associated encephalomalacia of the right temporal and parietal lobes and ex vacuo dilatation of the adjacent right lateral ventricle. Chronic small vessel ischemic disease and right basal ganglial lacunar infarct. 2. No acute intracranial abnormality. Electronically Signed   By: Tollie Ethavid  Kwon M.D.   On: 02/04/2017 17:13    Procedures Procedures (including critical care time)  Medications Ordered in ED Medications - No data to display   Initial Impression / Assessment and Plan / ED Course  I have reviewed the triage vital signs and the nursing notes.  Pertinent labs & imaging results that were available during my care of the patient were reviewed by me and considered in my medical decision making (see chart for details).    Pt with dental caries and no facial swelling.  No signs of ludwig's angina or difficulty swallowing and no systemic symptoms.  Pt is c/o of headache with hx of aneurysm surgery.  States he has not  had it checked for some time.  We will do a CT to ensure no acute complications however no new findings on neuro exam and patient is otherwise mentally intact.  CT without acute findings. Will treat with clinda and have pt f/u with dentist.   Final Clinical Impressions(s) / ED Diagnoses   Final diagnoses:  Dental caries  Acute pulpitis    ED Discharge Orders        Ordered    clindamycin (CLEOCIN) 150 MG capsule  Every 6 hours     02/04/17 1753       Gwyneth SproutPlunkett, Dhani Imel, MD 02/04/17 1756

## 2020-03-13 ENCOUNTER — Encounter: Payer: Self-pay | Admitting: *Deleted

## 2020-03-15 ENCOUNTER — Encounter: Payer: Self-pay | Admitting: Diagnostic Neuroimaging

## 2020-03-15 ENCOUNTER — Other Ambulatory Visit: Payer: Self-pay

## 2020-03-15 ENCOUNTER — Ambulatory Visit: Payer: Medicare Other | Admitting: Diagnostic Neuroimaging

## 2020-03-15 VITALS — BP 153/86 | HR 61 | Ht 66.0 in | Wt 147.0 lb

## 2020-03-15 DIAGNOSIS — I611 Nontraumatic intracerebral hemorrhage in hemisphere, cortical: Secondary | ICD-10-CM

## 2020-03-15 DIAGNOSIS — I10 Essential (primary) hypertension: Secondary | ICD-10-CM | POA: Diagnosis not present

## 2020-03-15 DIAGNOSIS — Z298 Encounter for other specified prophylactic measures: Secondary | ICD-10-CM | POA: Diagnosis not present

## 2020-03-15 NOTE — Patient Instructions (Addendum)
  RIGHT TEMPORAL INTRACEREBRAL PARENCHYMAL HEMORRHAGE (due to hypertension emergency in March 2014; no evidence of aneurysm) - has been on anti-seizure prophylaxis (initially LEV; then CBZ) since 2014; I reviewed records from 2014-2015 and definite mention of witnessed seizure activity in the chart, except for 1 nursing update in Dec 2014 medical history that mentioned "sz x 2 after brain surgery"; I could not find any mention in the discharge summary after ICH nor rehab stay from 2014. Also patient tells me that his ex-girlfriend may have noticed some "seizure" activity at some tme, but not convulsions; patient himselfdenies any history of seizures  - continue carbamazepine 200mg  twice a day for now  - check labs

## 2020-03-15 NOTE — Progress Notes (Signed)
[facility]  PATIENT: Luis Oconnor DOB: 03-12-1956  REFERRING CLINICIAN: Verlon Au, MD HISTORY FROM: patient  REASON FOR VISIT: new consult    HISTORICAL  CHIEF COMPLAINT:  Chief Complaint  Patient presents with  . CVD, Seizures    Rm 7 New Pt    HISTORY OF PRESENT ILLNESS:   64 year old male here for evaluation of seizure.  In 2014 patient had sudden onset of severe headache and confusion, went to the hospital was diagnosed with right temporal intracerebral hemorrhage.  This was felt to be due to hypertensive emergency.  Patient had emergency craniotomy to relieve mass-effect.  Patient had good recovery and was treated with levetiracetam for seizure prophylaxis.  This was switched to carbamazepine during his inpatient rehabilitation to also help with headaches.  He has continued on this medication ever since that time.  I reviewed extensive hospital notes and no mention of witnessed seizure could be found.  At some point his medical record was updated to mention "seizure x2" following brain surgery but not sure if this was entered as a result of patient history and incorrect memory.  Patient recalls an ex-girlfriend who may have noted some type of seizure activity such as staring spell and confusion.  However as result patient has continued on antiseizure medication since 2014.  Overall doing well.  No side effects.  No recurrent seizures, stroke or other problems.   REVIEW OF SYSTEMS: Full 14 system review of systems performed and negative with exception of: as per HPI.  ALLERGIES: Allergies  Allergen Reactions  . Penicillins     unknown    HOME MEDICATIONS: Outpatient Medications Prior to Visit  Medication Sig Dispense Refill  . acetaminophen (TYLENOL) 500 MG tablet Take 500 mg by mouth every 6 (six) hours as needed for headache (pain).     Marland Kitchen amLODipine (NORVASC) 10 MG tablet Take 10 mg by mouth daily.    . carbamazepine (TEGRETOL) 200  MG tablet Take 200 mg by mouth 2 (two) times daily.    . hydrALAZINE (APRESOLINE) 50 MG tablet Take 1 tablet (50 mg total) by mouth every 8 (eight) hours. 90 tablet 0  . lisinopril (PRINIVIL,ZESTRIL) 40 MG tablet Take 1 tablet (40 mg total) by mouth daily. 30 tablet 0  . metoprolol tartrate (LOPRESSOR) 50 MG tablet Take 50 mg by mouth daily.    . Multiple Vitamins-Minerals (MULTIVITAMIN) tablet Take 0.5 tablets by mouth 2 (two) times daily.     Marland Kitchen Propylene Glycol (SYSTANE BALANCE OP) Apply 2 drops to eye every morning. Patient advised to use two drops in each eye, each morning. Patient states he does not use them every morning and his last dose was about a week ago.    . spironolactone (ALDACTONE) 25 MG tablet Take 25 mg by mouth daily.    Marland Kitchen atenolol (TENORMIN) 25 MG tablet Take 25 mg by mouth 2 (two) times daily.  (Patient not taking: Reported on 03/15/2020)    . fluticasone (FLONASE) 50 MCG/ACT nasal spray Place 1 spray into both nostrils daily as needed for allergies or rhinitis. (Patient not taking: Reported on 03/15/2020)    . clindamycin (CLEOCIN) 150 MG capsule Take 1 capsule (150 mg total) every 6 (six) hours by mouth. 28 capsule 0  . sulfamethoxazole-trimethoprim (BACTRIM DS,SEPTRA DS) 800-160 MG tablet Take 1 tablet by mouth every 12 (twelve) hours. 14 tablet 0   No facility-administered medications prior to visit.    PAST MEDICAL HISTORY: Past Medical History:  Diagnosis Date  . Acid indigestion   . Cataract   . CVD (cardiovascular disease)   . Epistaxis, recurrent   . GERD (gastroesophageal reflux disease)   . Hypertension   . Inguinal hernia   . Intracerebral hemorrhage, nontraumatic (HCC) 06/14/2012   right temporal ICH due to hypertension; s/p craniotomy  . Ulcer     PAST SURGICAL HISTORY: Past Surgical History:  Procedure Laterality Date  . COLONOSCOPY  12/2014  . CRANIOTOMY Right 06/14/2012   Procedure: CRANIOTOMY HEMATOMA EVACUATION SUBDURAL;  Surgeon: Reinaldo Meekerandy O  Kritzer, MD;  Location: [facility];  Service: Neurosurgery;  Laterality: Right;  . FOOT SURGERY Right   . FRACTURE SURGERY Right    ankle  . INGUINAL HERNIA REPAIR Bilateral 03/17/2014   Procedure: LAPAROSCOPIC BILATERAL INGUINAL HERNIA REPAIR WITH MESH;  Surgeon: Axel FillerArmando Ramirez, MD;  Location: [facility];  Service: General;  Laterality: Bilateral;  . INSERTION OF MESH N/A 03/17/2014   Procedure: INSERTION OF MESH;  Surgeon: Axel FillerArmando Ramirez, MD;  Location: [facility];  Service: General;  Laterality: N/A;  . SKIN GRAFT Left    ring finger injury    FAMILY HISTORY: Family History  Problem Relation Age of Onset  . Hypertension Mother   . Heart attack Mother   . Hypertension Father   . Stomach cancer Father   . Cancer Brother        leukemia  . Renal Disease Brother     SOCIAL HISTORY: Social History   Socioeconomic History  . Marital status: Single    Spouse name: Not on file  . Number of children: 0  . Years of education: Not on file  . Highest education level: High school graduate  Occupational History    Comment: disability  Tobacco Use  . Smoking status: Never Smoker  . Smokeless tobacco: Never Used  Substance and Sexual Activity  . Alcohol use: No    Comment: formerly drank occ beer. None since brain surgery  . Drug use: Yes    Types: "Crack" cocaine    Comment: 03/15/20 none for 1-2 years  . Sexual activity: Not on file  Other Topics Concern  . Not on file  Social History Narrative   Lives alone   Social Determinants of Health   Financial Resource Strain: Not on file  Food Insecurity: Not on file  Transportation Needs: Not on file  Physical Activity: Not on file  Stress: Not on file  Social Connections: Not on file  Intimate Partner Violence: Not on file     PHYSICAL EXAM  GENERAL EXAM/CONSTITUTIONAL: Vitals:  Vitals:   03/15/20 0910  BP: (!) 153/86  Pulse: 61  Weight: 147 lb (66.7 kg)  Height: 5\' 6"  (1.676 m)   Body mass index is 23.73 kg/m. Wt  Readings from Last 3 Encounters:  03/15/20 147 lb (66.7 kg)  02/04/17 140 lb (63.5 kg)  01/14/15 132 lb 9.6 oz (60.1 kg)    Patient is in no distress; well developed, nourished and groomed; neck is supple  CARDIOVASCULAR:  Examination of carotid arteries is normal; no carotid bruits  Regular rate and rhythm, no murmurs  Examination of peripheral vascular system by observation and palpation is normal  EYES:  Ophthalmoscopic exam of optic discs and posterior segments is normal; no papilledema or hemorrhages No exam data present  MUSCULOSKELETAL:  Gait, strength, tone, movements noted in Neurologic exam below  NEUROLOGIC: MENTAL STATUS:  No flowsheet data found.  awake, alert, oriented to person, place and time  recent and remote memory intact  normal attention and concentration  language fluent, comprehension intact, naming intact  fund of knowledge appropriate  CRANIAL NERVE:   2nd - no papilledema on fundoscopic exam  2nd, 3rd, 4th, 6th - pupils equal and reactive to light, visual fields full to confrontation EXCEPT DECR LEFT HOMONYMOUS SUPERIOR QUADRANTANOPSIA, extraocular muscles intact, no nystagmus  5th - facial sensation symmetric  7th - facial strength symmetric  8th - hearing intact  9th - palate elevates symmetrically, uvula midline  11th - shoulder shrug symmetric  12th - tongue protrusion midline  MOTOR:   normal bulk and tone, full strength in the BUE, BLE  SENSORY:   normal and symmetric to light touch, temperature, vibration  COORDINATION:   finger-nose-finger, fine finger movements normal  REFLEXES:   deep tendon reflexes present and symmetric  GAIT/STATION:   narrow based gait     DIAGNOSTIC DATA (LABS, IMAGING, TESTING) - I reviewed patient records, labs, notes, testing and imaging myself where available.  Lab Results  Component Value Date   WBC 5.6 01/13/2015   HGB 12.1 (L) 01/13/2015   HCT 35.2 (L) 01/13/2015    MCV 85.6 01/13/2015   PLT 180 01/13/2015      Component Value Date/Time   NA 130 (L) 01/13/2015 1200   K 4.1 01/13/2015 1200   CL 96 (L) 01/13/2015 1200   CO2 28 01/13/2015 1200   GLUCOSE 100 (H) 01/13/2015 1200   BUN 10 01/13/2015 1200   CREATININE 0.84 01/13/2015 1200   CALCIUM 8.8 (L) 01/13/2015 1200   PROT 6.4 (L) 01/13/2015 1200   ALBUMIN 3.3 (L) 01/13/2015 1200   AST 15 01/13/2015 1200   ALT 17 01/13/2015 1200   ALKPHOS 61 01/13/2015 1200   BILITOT 0.3 01/13/2015 1200   GFRNONAA >60 01/13/2015 1200   GFRAA >60 01/13/2015 1200   No results found for: CHOL, HDL, LDLCALC, LDLDIRECT, TRIG, CHOLHDL No results found for: XBJY7W No results found for: VITAMINB12 Lab Results  Component Value Date   TSH 0.873 01/14/2015    06/14/12 CT head [I reviewed images myself and agree with interpretation. -VRP]  - 3.6 x 5.8 x 6.5 cm right temporoparietal intraparenchymal hematoma with mass effect and 4 mm right to left midline shift.   04/23/13 MRI brain - Chronic microvascular ischemia. No acute infarct.  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  Extensive chronic micro hemorrhage throughout the brain, consistent  with malignant hypertension. No acute hemorrhage is seen on the CT  earlier today.     ASSESSMENT AND PLAN  64 y.o. year old male here with:   Dx:  1. 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 Essential hypertension   3. Seizure prophylaxis      PLAN:  RIGHT TEMPORAL INTRACEREBRAL PARENCHYMAL HEMORRHAGE (due to hypertension emergency in March 2014; no evidence of aneurysm) - has been on anti-seizure prophylaxis (initially LEV; then CBZ) since 2014; I reviewed records from 2014-2015 and definite mention of witnessed seizure activity in the chart, except for 1 nursing update in Dec 2014 medical history that mentioned "sz x 2 after brain surgery"; I could not find any mention in the discharge summary after ICH  nor rehab stay from 2014. Also patient tells me that his ex-girlfriend may have noticed some "seizure" activity at some tme, but not convulsions; patient himselfdenies any history of seizures  - continue carbamazepine 200mg  twice a day for now  - check labs  Orders Placed This  Encounter  Procedures  . CBC with Differential/Platelet  . Comprehensive metabolic panel   Return for return to PCP, pending if symptoms worsen or fail to improve.    Suanne Marker, MD 03/15/2020, 9:45 AM Certified in Neurology, Neurophysiology and Neuroimaging  [facility] [address], [address] [place], [place] [address] [phone]

## 2020-03-16 LAB — COMPREHENSIVE METABOLIC PANEL
ALT: 19 IU/L (ref 0–44)
AST: 19 IU/L (ref 0–40)
Albumin/Globulin Ratio: 1.7 (ref 1.2–2.2)
Albumin: 4.3 g/dL (ref 3.8–4.8)
Alkaline Phosphatase: 77 IU/L (ref 44–121)
BUN/Creatinine Ratio: 17 (ref 10–24)
BUN: 19 mg/dL (ref 8–27)
Bilirubin Total: 0.4 mg/dL (ref 0.0–1.2)
CO2: 25 mmol/L (ref 20–29)
Calcium: 9.6 mg/dL (ref 8.6–10.2)
Chloride: 102 mmol/L (ref 96–106)
Creatinine, Ser: 1.1 mg/dL (ref 0.76–1.27)
GFR calc Af Amer: 80 mL/min/{1.73_m2} (ref >59)
GFR calc non Af Amer: 70 mL/min/{1.73_m2} (ref >59)
Globulin, Total: 2.6 g/dL (ref 1.5–4.5)
Glucose: 80 mg/dL (ref 65–99)
Potassium: 4.2 mmol/L (ref 3.5–5.2)
Sodium: 140 mmol/L (ref 134–144)
Total Protein: 6.9 g/dL (ref 6.0–8.5)

## 2020-03-16 LAB — CBC WITH DIFFERENTIAL/PLATELET
Basophils Absolute: 0 10*3/uL (ref 0.0–0.2)
Basos: 1 %
EOS (ABSOLUTE): 0.2 10*3/uL (ref 0.0–0.4)
Eos: 4 %
Hematocrit: 44.4 % (ref 37.5–51.0)
Hemoglobin: 15.1 g/dL (ref 13.0–17.7)
Immature Grans (Abs): 0 10*3/uL (ref 0.0–0.1)
Immature Granulocytes: 0 %
Lymphocytes Absolute: 0.8 10*3/uL (ref 0.7–3.1)
Lymphs: 16 %
MCH: 30 pg (ref 26.6–33.0)
MCHC: 34 g/dL (ref 31.5–35.7)
MCV: 88 fL (ref 79–97)
Monocytes Absolute: 0.6 10*3/uL (ref 0.1–0.9)
Monocytes: 13 %
Neutrophils Absolute: 3.1 10*3/uL (ref 1.4–7.0)
Neutrophils: 66 %
Platelets: 197 10*3/uL (ref 150–450)
RBC: 5.04 x10E6/uL (ref 4.14–5.80)
RDW: 13.1 % (ref 11.6–15.4)
WBC: 4.8 10*3/uL (ref 3.4–10.8)

## 2020-03-21 ENCOUNTER — Telehealth: Payer: Self-pay | Admitting: *Deleted

## 2020-03-21 NOTE — Telephone Encounter (Signed)
-----   Message from Suanne Marker, MD sent at 03/21/2020  2:09 PM EST ----- Normal labs. Please call patient. -VRP

## 2020-03-21 NOTE — Telephone Encounter (Signed)
Called pt first, phone continued to ring. Luis Oconnor on [place]. Relayed lab results. She verbalized understanding and will let pt know.
